# Patient Record
Sex: Male | Born: 1955
Health system: Southern US, Community
[De-identification: ages and names within clinical notes are randomized; demographics above are authoritative.]

## PROBLEM LIST (undated history)

## (undated) DIAGNOSIS — F419 Anxiety disorder, unspecified: Secondary | ICD-10-CM

## (undated) DIAGNOSIS — Z8489 Family history of other specified conditions: Secondary | ICD-10-CM

## (undated) DIAGNOSIS — L309 Dermatitis, unspecified: Secondary | ICD-10-CM

## (undated) DIAGNOSIS — E7800 Pure hypercholesterolemia, unspecified: Secondary | ICD-10-CM

## (undated) DIAGNOSIS — R519 Headache, unspecified: Secondary | ICD-10-CM

## (undated) DIAGNOSIS — E0590 Thyrotoxicosis, unspecified without thyrotoxic crisis or storm: Secondary | ICD-10-CM

## (undated) DIAGNOSIS — M75101 Unspecified rotator cuff tear or rupture of right shoulder, not specified as traumatic: Secondary | ICD-10-CM

## (undated) DIAGNOSIS — Z87442 Personal history of urinary calculi: Secondary | ICD-10-CM

## (undated) DIAGNOSIS — R51 Headache: Secondary | ICD-10-CM

## (undated) DIAGNOSIS — G4730 Sleep apnea, unspecified: Secondary | ICD-10-CM

## (undated) DIAGNOSIS — E119 Type 2 diabetes mellitus without complications: Secondary | ICD-10-CM

## (undated) DIAGNOSIS — F988 Other specified behavioral and emotional disorders with onset usually occurring in childhood and adolescence: Secondary | ICD-10-CM

## (undated) DIAGNOSIS — M1990 Unspecified osteoarthritis, unspecified site: Secondary | ICD-10-CM

## (undated) DIAGNOSIS — I10 Essential (primary) hypertension: Secondary | ICD-10-CM

## (undated) HISTORY — PX: SHOULDER SURGERY: SHX246

## (undated) HISTORY — DX: Type 2 diabetes mellitus without complications: E11.9

## (undated) HISTORY — DX: Dermatitis, unspecified: L30.9

## (undated) HISTORY — PX: APPENDECTOMY: SHX54

## (undated) HISTORY — DX: Pure hypercholesterolemia, unspecified: E78.00

---

## 2012-05-03 HISTORY — PX: OTHER SURGICAL HISTORY: SHX169

## 2014-05-03 HISTORY — PX: SHOULDER SURGERY: SHX246

## 2014-09-27 NOTE — H&P (Signed)
TOTAL HIP ADMISSION H&P  Patient is admitted for right total hip arthroplasty, anterior approach.  Subjective:  Chief Complaint:  Right hip primary OA / pain  HPI: Steven Contreras, 59 y.o. male, has a history of pain and functional disability in the right hip(s) due to arthritis and patient has failed non-surgical conservative treatments for greater than 12 weeks to include NSAID's and/or analgesics, corticosteriod injections and activity modification.  Onset of symptoms was abrupt starting a couple months ago  with rapidlly worsening course since that time.The patient noted no past surgery on the right hip(s).  Patient currently rates pain in the right hip at 10 out of 10 with activity. Patient has night pain, worsening of pain with activity and weight bearing, trendelenberg gait, pain that interfers with activities of daily living and pain with passive range of motion. Patient has evidence of periarticular osteophytes and joint space narrowing by imaging studies. This condition presents safety issues increasing the risk of falls.  There is no current active infection.  Risks, benefits and expectations were discussed with the patient.  Risks including but not limited to the risk of anesthesia, blood clots, nerve damage, blood vessel damage, failure of the prosthesis, infection and up to and including death.  Patient understand the risks, benefits and expectations and wishes to proceed with surgery.   PCP: Emeterio ReeveWOLTERS,SHARON A, MD  D/C Plans:      Home with HHPT  (same day if possible)  Post-op Meds:       No Rx given  Tranexamic Acid:      To be given - IV   Decadron:      Is to be given  FYI:     ASA post-op  Norco post-op     Past Medical History  Diagnosis Date  . Hypertension   . ADD (attention deficit disorder)   . Anxiety     hx of  . Arthritis   . Headache   . Right rotator cuff tear   . Family history of adverse reaction to anesthesia     mother slow to awaken    Past  Surgical History  Procedure Laterality Date  . Shoulder surgery Left years ago    rotator, bicep and clavicle repair.  . Colonscopy  2014    No prescriptions prior to admission   Allergies  Allergen Reactions  . Ampicillin     NOT sure if its ampicillin or amoxicillin---stomach cramps     History  Substance Use Topics  . Smoking status: Former Smoker -- 0.50 packs/day for 15 years    Types: Cigarettes    Quit date: 05/03/2004  . Smokeless tobacco: Never Used  . Alcohol Use: Yes     Comment: 2 shots burbon per day or beer       Review of Systems  Constitutional: Negative.   Eyes: Negative.   Respiratory: Negative.   Cardiovascular: Negative.   Gastrointestinal: Negative.   Genitourinary: Negative.   Musculoskeletal: Positive for joint pain.  Skin: Negative.   Neurological: Positive for headaches.  Endo/Heme/Allergies: Negative.   Psychiatric/Behavioral: The patient is nervous/anxious.     Objective:  Physical Exam  Constitutional: He is oriented to person, place, and time. He appears well-developed and well-nourished.  HENT:  Head: Normocephalic and atraumatic.  Eyes: Pupils are equal, round, and reactive to light.  Neck: Neck supple. No JVD present. No tracheal deviation present. No thyromegaly present.  Cardiovascular: Normal rate, regular rhythm, normal heart sounds and intact distal pulses.  Respiratory: Effort normal and breath sounds normal. No stridor. No respiratory distress. He has no wheezes.  GI: Soft. There is no tenderness. There is no guarding.  Musculoskeletal:       Right hip: He exhibits decreased range of motion, decreased strength, tenderness and bony tenderness. He exhibits no swelling, no deformity and no laceration.  Lymphadenopathy:    He has no cervical adenopathy.  Neurological: He is alert and oriented to person, place, and time.  Skin: Skin is warm and dry.  Psychiatric: He has a normal mood and affect.     Imaging Review Plain  radiographs demonstrate moderate degenerative joint disease of the right hip(s). The bone quality appears to be good for age and reported activity level.  Assessment/Plan:  End stage arthritis, left hip(s)  The patient history, physical examination, clinical judgement of the provider and imaging studies are consistent with end stage degenerative joint disease of the right hip(s) and total hip arthroplasty is deemed medically necessary. The treatment options including medical management, injection therapy, arthroscopy and arthroplasty were discussed at length. The risks and benefits of total hip arthroplasty were presented and reviewed. The risks due to aseptic loosening, infection, stiffness, dislocation/subluxation,  thromboembolic complications and other imponderables were discussed.  The patient acknowledged the explanation, agreed to proceed with the plan and consent was signed. Patient is being admitted for inpatient treatment for surgery, pain control, PT, OT, prophylactic antibiotics, VTE prophylaxis, progressive ambulation and ADL's and discharge planning.The patient is planning to be discharged home with home health services.     Anastasio Auerbach Shykeem Resurreccion   PA-C  10/06/2014, 2:37 PM

## 2014-10-01 ENCOUNTER — Other Ambulatory Visit (HOSPITAL_COMMUNITY): Payer: Self-pay | Admitting: *Deleted

## 2014-10-01 NOTE — Patient Instructions (Addendum)
Steven SalmonRobert K Contreras  10/01/2014   Your procedure is scheduled on: Tuesday 10-08-14  [facility] and follow signs to               Short Stay Center at 515 AM.  Call this number if you have problems the morning of surgery [phone]   Remember: ONLY 1 PERSON MAY GO WITH YOU TO SHORT STAY TO GET  READY MORNING OF YOUR SURGERY.  Do not eat food or drink liquids :After Midnight.     Take these medicines the morning of surgery with A SIP OF WATER: flonase nasal spray if needed, eye drop if needed                               You may not have any metal on your body including hair pins and              piercings  Do not wear jewelry, make-up, lotions, powders or perfumes, deodorant             Do not wear nail polish.  Do not shave  48 hours prior to surgery.              Men may shave face and neck.   Do not bring valuables to the hospital. [place] IS NOT             RESPONSIBLE   FOR VALUABLES.  Contacts, dentures or bridgework may not be worn into surgery.  Leave suitcase in the car. After surgery it may be brought to your room.     Patients discharged the day of surgery will not be allowed to drive home.  Name and phone number of your driver:  Special Instructions: N/A              Please read over the following fact sheets you were given: _____________________________________________________________________             [facility] Pre[facility] Before surgery, you can play an important role.  Because skin is not sterile, your skin needs to be as free of germs as possible.  You can reduce the number of germs on your skin by washing with CHG (chlorahexidine gluconate) soap before surgery.  CHG is an antiseptic cleaner which kills germs and bonds with the skin to continue killing germs even after washing. Please DO NOT use if you have an allergy to CHG or antibacterial soaps.  If your skin becomes reddened/irritated stop using  the CHG and inform your nurse when you arrive at Short Stay. Do not shave (including legs and underarms) for at least 48 hours prior to the first CHG shower.  You may shave your face/neck. Please follow these instructions carefully:  1.  Shower with CHG Soap the night before surgery and the  morning of Surgery.  2.  If you choose to wash your hair, wash your hair first as usual with your  normal  shampoo.  3.  After you shampoo, rinse your hair and body thoroughly to remove the  shampoo.                           4.  Use CHG as you would any other liquid soap.  You can apply chg directly  to  the skin and wash                       Gently with a scrungie or clean washcloth.  5.  Apply the CHG Soap to your body ONLY FROM THE NECK DOWN.   Do not use on face/ open                           Wound or open sores. Avoid contact with eyes, ears mouth and genitals (private parts).                       Wash face,  Genitals (private parts) with your normal soap.             6.  Wash thoroughly, paying special attention to the area where your surgery  will be performed.  7.  Thoroughly rinse your body with warm water from the neck down.  8.  DO NOT shower/wash with your normal soap after using and rinsing off  the CHG Soap.                9.  Pat yourself dry with a clean towel.            10.  Wear clean pajamas.            11.  Place clean sheets on your bed the night of your first shower and do not  sleep with pets. Day of Surgery : Do not apply any lotions/deodorants the morning of surgery.  Please wear clean clothes to the hospital/surgery center.  FAILURE TO FOLLOW THESE INSTRUCTIONS MAY RESULT IN THE CANCELLATION OF YOUR SURGERY PATIENT SIGNATURE_________________________________  NURSE SIGNATURE__________________________________  ________________________________________________________________________   Steven Contreras  An incentive spirometer is a tool that can help keep your lungs  clear and active. This tool measures how well you are filling your lungs with each breath. Taking long deep breaths may help reverse or decrease the chance of developing breathing (pulmonary) problems (especially infection) following:  A long period of time when you are unable to move or be active. BEFORE THE PROCEDURE   If the spirometer includes an indicator to show your best effort, your nurse or respiratory therapist will set it to a desired goal.  If possible, sit up straight or lean slightly forward. Try not to slouch.  Hold the incentive spirometer in an upright position. INSTRUCTIONS FOR USE   Sit on the edge of your bed if possible, or sit up as far as you can in bed or on a chair.  Hold the incentive spirometer in an upright position.  Breathe out normally.  Place the mouthpiece in your mouth and seal your lips tightly around it.  Breathe in slowly and as deeply as possible, raising the piston or the ball toward the top of the column.  Hold your breath for 3-5 seconds or for as long as possible. Allow the piston or ball to fall to the bottom of the column.  Remove the mouthpiece from your mouth and breathe out normally.  Rest for a few seconds and repeat Steps 1 through 7 at least 10 times every 1-2 hours when you are awake. Take your time and take a few normal breaths between deep breaths.  The spirometer may include an indicator to show your best effort. Use the indicator as a goal to work toward during each repetition.  After each set  of 10 deep breaths, practice coughing to be sure your lungs are clear. If you have an incision (the cut made at the time of surgery), support your incision when coughing by placing a pillow or rolled up towels firmly against it. Once you are able to get out of bed, walk around indoors and cough well. You may stop using the incentive spirometer when instructed by your caregiver.  RISKS AND COMPLICATIONS  Take your time so you do not get  dizzy or light-headed.  If you are in pain, you may need to take or ask for pain medication before doing incentive spirometry. It is harder to take a deep breath if you are having pain. AFTER USE  Rest and breathe slowly and easily.  It can be helpful to keep track of a log of your progress. Your caregiver can provide you with a simple table to help with this. If you are using the spirometer at home, follow these instructions: [place] IF:   You are having difficultly using the spirometer.  You have trouble using the spirometer as often as instructed.  Your pain medication is not giving enough relief while using the spirometer.  You develop fever of 100.5 F (38.1 C) or higher. SEEK IMMEDIATE MEDICAL CARE IF:   You cough up bloody sputum that had not been present before.  You develop fever of 102 F (38.9 C) or greater.  You develop worsening pain at or near the incision site. MAKE SURE YOU:   Understand these instructions.  Will watch your condition.  Will get help right away if you are not doing well or get worse. Document Released: 08/30/2006 Document Revised: 07/12/2011 Document Reviewed: 10/31/2006 Exit[employer] Patient Information 2014 [employer], [place].   ________________________________________________________________________  WHAT IS A BLOOD TRANSFUSION? Blood Transfusion Information  A transfusion is the replacement of blood or some of its parts. Blood is made up of multiple cells which provide different functions.  Red blood cells carry oxygen and are used for blood loss replacement.  White blood cells fight against infection.  Platelets control bleeding.  Plasma helps clot blood.  Other blood products are available for specialized needs, such as hemophilia or other clotting disorders. BEFORE THE TRANSFUSION  Who gives blood for transfusions?   Healthy volunteers who are fully evaluated to make sure their blood is safe. This is blood bank  blood. Transfusion therapy is the safest it has ever been in the practice of medicine. Before blood is taken from a donor, a complete history is taken to make sure that person has no history of diseases nor engages in risky social behavior (examples are intravenous drug use or sexual activity with multiple partners). The donor's travel history is screened to minimize risk of transmitting infections, such as malaria. The donated blood is tested for signs of infectious diseases, such as HIV and hepatitis. The blood is then tested to be sure it is compatible with you in order to minimize the chance of a transfusion reaction. If you or a relative donates blood, this is often done in anticipation of surgery and is not appropriate for emergency situations. It takes many days to process the donated blood. RISKS AND COMPLICATIONS Although transfusion therapy is very safe and saves many lives, the main dangers of transfusion include:   Getting an infectious disease.  Developing a transfusion reaction. This is an allergic reaction to something in the blood you were given. Every precaution is taken to prevent this. The decision to have a  blood transfusion has been considered carefully by your caregiver before blood is given. Blood is not given unless the benefits outweigh the risks. AFTER THE TRANSFUSION  Right after receiving a blood transfusion, you will usually feel much better and more energetic. This is especially true if your red blood cells have gotten low (anemic). The transfusion raises the level of the red blood cells which carry oxygen, and this usually causes an energy increase.  The nurse administering the transfusion will monitor you carefully for complications. HOME CARE INSTRUCTIONS  No special instructions are needed after a transfusion. You may find your energy is better. Speak with your caregiver about any limitations on activity for underlying diseases you may have. SEEK MEDICAL CARE IF:    Your condition is not improving after your transfusion.  You develop redness or irritation at the intravenous (IV) site. SEEK IMMEDIATE MEDICAL CARE IF:  Any of the following symptoms occur over the next 12 hours:  Shaking chills.  You have a temperature by mouth above 102 F (38.9 C), not controlled by medicine.  Chest, back, or muscle pain.  People around you feel you are not acting correctly or are confused.  Shortness of breath or difficulty breathing.  Dizziness and fainting.  You get a rash or develop hives.  You have a decrease in urine output.  Your urine turns a dark color or changes to pink, red, or brown. Any of the following symptoms occur over the next 10 days:  You have a temperature by mouth above 102 F (38.9 C), not controlled by medicine.  Shortness of breath.  Weakness after normal activity.  The white part of the eye turns yellow (jaundice).  You have a decrease in the amount of urine or are urinating less often.  Your urine turns a dark color or changes to pink, red, or brown. Document Released: 04/16/2000 Document Revised: 07/12/2011 Document Reviewed: 12/04/2007 [facility] Patient Information 2014 [place], [place].  _______________________________________________________________________

## 2014-10-03 ENCOUNTER — Encounter (HOSPITAL_COMMUNITY): Payer: Self-pay

## 2014-10-03 ENCOUNTER — Encounter (HOSPITAL_COMMUNITY)
Admission: RE | Admit: 2014-10-03 | Discharge: 2014-10-03 | Disposition: A | Discharge status: Home / Self Care | Payer: Commercial Managed Care - PPO | Source: Ambulatory Visit | Attending: Orthopedic Surgery | Admitting: Orthopedic Surgery

## 2014-10-03 DIAGNOSIS — Z01810 Encounter for preprocedural cardiovascular examination: Secondary | ICD-10-CM | POA: Insufficient documentation

## 2014-10-03 DIAGNOSIS — Z01812 Encounter for preprocedural laboratory examination: Secondary | ICD-10-CM | POA: Insufficient documentation

## 2014-10-03 DIAGNOSIS — M1611 Unilateral primary osteoarthritis, right hip: Secondary | ICD-10-CM | POA: Insufficient documentation

## 2014-10-03 HISTORY — DX: Headache, unspecified: R51.9

## 2014-10-03 HISTORY — DX: Anxiety disorder, unspecified: F41.9

## 2014-10-03 HISTORY — DX: Family history of other specified conditions: Z84.89

## 2014-10-03 HISTORY — DX: Unspecified rotator cuff tear or rupture of right shoulder, not specified as traumatic: M75.101

## 2014-10-03 HISTORY — DX: Other specified behavioral and emotional disorders with onset usually occurring in childhood and adolescence: F98.8

## 2014-10-03 HISTORY — DX: Essential (primary) hypertension: I10

## 2014-10-03 HISTORY — DX: Headache: R51

## 2014-10-03 HISTORY — DX: Unspecified osteoarthritis, unspecified site: M19.90

## 2014-10-03 LAB — URINALYSIS, ROUTINE W REFLEX MICROSCOPIC
BILIRUBIN URINE: NEGATIVE
Glucose, UA: NEGATIVE mg/dL
KETONES UR: NEGATIVE mg/dL
Leukocytes, UA: NEGATIVE
Nitrite: NEGATIVE
PH: 5 (ref 5.0–8.0)
Protein, ur: NEGATIVE mg/dL
Specific Gravity, Urine: 1.008 (ref 1.005–1.030)
UROBILINOGEN UA: 0.2 mg/dL (ref 0.0–1.0)

## 2014-10-03 LAB — BASIC METABOLIC PANEL
ANION GAP: 10 (ref 5–15)
BUN: 12 mg/dL (ref 6–20)
CHLORIDE: 102 mmol/L (ref 101–111)
CO2: 25 mmol/L (ref 22–32)
CREATININE: 0.59 mg/dL — AB (ref 0.61–1.24)
Calcium: 10.4 mg/dL — ABNORMAL HIGH (ref 8.9–10.3)
GFR calc Af Amer: >60 mL/min (ref >60)
GFR calc non Af Amer: >60 mL/min (ref >60)
Glucose, Bld: 118 mg/dL — ABNORMAL HIGH (ref 65–99)
Potassium: 4 mmol/L (ref 3.5–5.1)
SODIUM: 137 mmol/L (ref 135–145)

## 2014-10-03 LAB — CBC
HCT: 46.3 % (ref 39.0–52.0)
Hemoglobin: 15.7 g/dL (ref 13.0–17.0)
MCH: 28.7 pg (ref 26.0–34.0)
MCHC: 33.9 g/dL (ref 30.0–36.0)
MCV: 84.6 fL (ref 78.0–100.0)
Platelets: 255 10*3/uL (ref 150–400)
RBC: 5.47 MIL/uL (ref 4.22–5.81)
RDW: 12.6 % (ref 11.5–15.5)
WBC: 6.8 10*3/uL (ref 4.0–10.5)

## 2014-10-03 LAB — PROTIME-INR
INR: 0.96 (ref 0.00–1.49)
Prothrombin Time: 13 seconds (ref 11.6–15.2)

## 2014-10-03 LAB — URINE MICROSCOPIC-ADD ON

## 2014-10-03 LAB — SURGICAL PCR SCREEN
MRSA, PCR: NEGATIVE
STAPHYLOCOCCUS AUREUS: NEGATIVE

## 2014-10-03 LAB — APTT: aPTT: 26 seconds (ref 24–37)

## 2014-10-03 LAB — ABO/RH: ABO/RH(D): O POS

## 2014-10-03 NOTE — Progress Notes (Signed)
   10/03/14 0855  OBSTRUCTIVE SLEEP APNEA  Have you ever been diagnosed with sleep apnea through a sleep study? No  Do you snore loudly (loud enough to be heard through closed doors)?  0  Do you often feel tired, fatigued, or sleepy during the daytime? 0  Has anyone observed you stop breathing during your sleep? 1  Do you have, or are you being treated for high blood pressure? 1  BMI more than 35 kg/m2? 1  Age over 60 years old? 1  Neck circumference greater than 40 cm/16 inches? 1 ( 18 inches)  Gender: 1  Obstructive Sleep Apnea Score 6

## 2014-10-08 ENCOUNTER — Inpatient Hospital Stay (HOSPITAL_COMMUNITY): Payer: Commercial Managed Care - PPO | Admitting: Certified Registered Nurse Anesthetist

## 2014-10-08 ENCOUNTER — Ambulatory Visit (HOSPITAL_COMMUNITY)
Admission: RE | Admit: 2014-10-08 | Discharge: 2014-10-08 | Disposition: A | Discharge status: Home / Self Care | Payer: Commercial Managed Care - PPO | Source: Ambulatory Visit | Attending: Orthopedic Surgery | Admitting: Orthopedic Surgery

## 2014-10-08 ENCOUNTER — Ambulatory Visit (HOSPITAL_COMMUNITY): Payer: Commercial Managed Care - PPO

## 2014-10-08 ENCOUNTER — Inpatient Hospital Stay (HOSPITAL_COMMUNITY): Payer: Commercial Managed Care - PPO

## 2014-10-08 ENCOUNTER — Encounter (HOSPITAL_COMMUNITY)
Admission: RE | Disposition: A | Discharge status: Home / Self Care | Payer: Self-pay | Source: Ambulatory Visit | Attending: Orthopedic Surgery

## 2014-10-08 ENCOUNTER — Encounter (HOSPITAL_COMMUNITY): Payer: Self-pay

## 2014-10-08 DIAGNOSIS — F988 Other specified behavioral and emotional disorders with onset usually occurring in childhood and adolescence: Secondary | ICD-10-CM | POA: Insufficient documentation

## 2014-10-08 DIAGNOSIS — I10 Essential (primary) hypertension: Secondary | ICD-10-CM | POA: Insufficient documentation

## 2014-10-08 DIAGNOSIS — Z79899 Other long term (current) drug therapy: Secondary | ICD-10-CM | POA: Diagnosis not present

## 2014-10-08 DIAGNOSIS — Z87891 Personal history of nicotine dependence: Secondary | ICD-10-CM | POA: Diagnosis not present

## 2014-10-08 DIAGNOSIS — M1611 Unilateral primary osteoarthritis, right hip: Secondary | ICD-10-CM | POA: Insufficient documentation

## 2014-10-08 DIAGNOSIS — Z96649 Presence of unspecified artificial hip joint: Secondary | ICD-10-CM

## 2014-10-08 HISTORY — PX: TOTAL HIP ARTHROPLASTY: SHX124

## 2014-10-08 LAB — TYPE AND SCREEN
ABO/RH(D): O POS
ANTIBODY SCREEN: NEGATIVE

## 2014-10-08 SURGERY — ARTHROPLASTY, HIP, TOTAL, ANTERIOR APPROACH
Anesthesia: Spinal | Site: Hip | Laterality: Right

## 2014-10-08 MED ORDER — SODIUM CHLORIDE 0.9 % IR SOLN
Status: DC | PRN
  Administered 2014-10-08: 1000 mL

## 2014-10-08 MED ORDER — CEPHALEXIN 500 MG PO CAPS
500.0000 mg | ORAL_CAPSULE | Freq: Three times a day (TID) | ORAL | Status: DC
Start: 2014-10-08 — End: 2017-12-20

## 2014-10-08 MED ORDER — FENTANYL CITRATE (PF) 100 MCG/2ML IJ SOLN
INTRAMUSCULAR | Status: AC
  Filled 2014-10-08: qty 2

## 2014-10-08 MED ORDER — HYDROMORPHONE HCL 1 MG/ML IJ SOLN: 0.2500 mg | INTRAMUSCULAR | Status: DC | PRN

## 2014-10-08 MED ORDER — PROPOFOL 10 MG/ML IV BOLUS
INTRAVENOUS | Status: AC
  Filled 2014-10-08: qty 20

## 2014-10-08 MED ORDER — CELECOXIB 200 MG PO CAPS: 200.0000 mg | ORAL_CAPSULE | Freq: Two times a day (BID) | ORAL | Status: DC

## 2014-10-08 MED ORDER — OXYCODONE HCL 5 MG PO TABS: 5.0000 mg | ORAL_TABLET | ORAL | Status: DC | PRN

## 2014-10-08 MED ORDER — METHOCARBAMOL 500 MG PO TABS
500.0000 mg | ORAL_TABLET | Freq: Four times a day (QID) | ORAL | Status: DC | PRN
  Administered 2014-10-08: 500 mg via ORAL
  Filled 2014-10-08: qty 1

## 2014-10-08 MED ORDER — PHENYLEPHRINE HCL 10 MG/ML IJ SOLN
INTRAMUSCULAR | Status: DC | PRN
  Administered 2014-10-08: 40 ug via INTRAVENOUS

## 2014-10-08 MED ORDER — DEXAMETHASONE SODIUM PHOSPHATE 10 MG/ML IJ SOLN
INTRAMUSCULAR | Status: AC
  Filled 2014-10-08: qty 1

## 2014-10-08 MED ORDER — MIDAZOLAM HCL 2 MG/2ML IJ SOLN
INTRAMUSCULAR | Status: AC
  Filled 2014-10-08: qty 2

## 2014-10-08 MED ORDER — LACTATED RINGERS IV SOLN
INTRAVENOUS | Status: DC
  Administered 2014-10-08: 10:00:00 via INTRAVENOUS

## 2014-10-08 MED ORDER — METHOCARBAMOL 500 MG PO TABS: 500.0000 mg | ORAL_TABLET | Freq: Four times a day (QID) | ORAL | Status: DC | PRN

## 2014-10-08 MED ORDER — PHENYLEPHRINE 40 MCG/ML (10ML) SYRINGE FOR IV PUSH (FOR BLOOD PRESSURE SUPPORT)
PREFILLED_SYRINGE | INTRAVENOUS | Status: AC
  Filled 2014-10-08: qty 10

## 2014-10-08 MED ORDER — CEFAZOLIN SODIUM-DEXTROSE 2-3 GM-% IV SOLR
2.0000 g | INTRAVENOUS | Status: AC
  Administered 2014-10-08: 2 g via INTRAVENOUS

## 2014-10-08 MED ORDER — OXYCODONE HCL 5 MG PO TABS
5.0000 mg | ORAL_TABLET | ORAL | Status: DC | PRN
  Administered 2014-10-08 (×2): 5 mg via ORAL
  Filled 2014-10-08 (×2): qty 1

## 2014-10-08 MED ORDER — LACTATED RINGERS IV SOLN
INTRAVENOUS | Status: DC | PRN
  Administered 2014-10-08: 07:00:00 via INTRAVENOUS

## 2014-10-08 MED ORDER — DEXAMETHASONE SODIUM PHOSPHATE 10 MG/ML IJ SOLN
10.0000 mg | Freq: Once | INTRAMUSCULAR | Status: AC
  Administered 2014-10-08: 10 mg via INTRAVENOUS

## 2014-10-08 MED ORDER — ASPIRIN EC 325 MG PO TBEC: 325.0000 mg | DELAYED_RELEASE_TABLET | Freq: Two times a day (BID) | ORAL | Status: AC

## 2014-10-08 MED ORDER — EPHEDRINE SULFATE 50 MG/ML IJ SOLN
INTRAMUSCULAR | Status: DC | PRN
  Administered 2014-10-08: 5 mg via INTRAVENOUS

## 2014-10-08 MED ORDER — ONDANSETRON HCL 4 MG/2ML IJ SOLN
INTRAMUSCULAR | Status: AC
  Filled 2014-10-08: qty 2

## 2014-10-08 MED ORDER — MIDAZOLAM HCL 5 MG/5ML IJ SOLN
INTRAMUSCULAR | Status: DC | PRN
  Administered 2014-10-08: 2 mg via INTRAVENOUS

## 2014-10-08 MED ORDER — DEXAMETHASONE SODIUM PHOSPHATE 4 MG/ML IJ SOLN
INTRAMUSCULAR | Status: DC | PRN
  Administered 2014-10-08: 10 mg via INTRAVENOUS

## 2014-10-08 MED ORDER — MEPERIDINE HCL 50 MG/ML IJ SOLN: 6.2500 mg | INTRAMUSCULAR | Status: DC | PRN

## 2014-10-08 MED ORDER — BUPIVACAINE IN DEXTROSE 0.75-8.25 % IT SOLN
INTRATHECAL | Status: DC | PRN
  Administered 2014-10-08: 15 mg via INTRATHECAL

## 2014-10-08 MED ORDER — PROMETHAZINE HCL 25 MG/ML IJ SOLN: 6.2500 mg | INTRAMUSCULAR | Status: DC | PRN

## 2014-10-08 MED ORDER — FENTANYL CITRATE (PF) 250 MCG/5ML IJ SOLN
INTRAMUSCULAR | Status: DC | PRN
  Administered 2014-10-08 (×2): 50 ug via INTRAVENOUS

## 2014-10-08 MED ORDER — LIDOCAINE HCL (CARDIAC) 20 MG/ML IV SOLN
INTRAVENOUS | Status: DC | PRN
  Administered 2014-10-08: 30 mg via INTRAVENOUS

## 2014-10-08 MED ORDER — ONDANSETRON HCL 4 MG/2ML IJ SOLN
INTRAMUSCULAR | Status: DC | PRN
  Administered 2014-10-08: 4 mg via INTRAVENOUS

## 2014-10-08 MED ORDER — PROPOFOL INFUSION 10 MG/ML OPTIME
INTRAVENOUS | Status: DC | PRN
Start: 2014-10-08 — End: 2014-10-08
  Administered 2014-10-08: 50 ug/kg/min via INTRAVENOUS

## 2014-10-08 MED ORDER — TRANEXAMIC ACID 1000 MG/10ML IV SOLN
1000.0000 mg | Freq: Once | INTRAVENOUS | Status: AC
  Administered 2014-10-08: 1000 mg via INTRAVENOUS
  Filled 2014-10-08: qty 10

## 2014-10-08 MED ORDER — CEFAZOLIN SODIUM-DEXTROSE 2-3 GM-% IV SOLR
INTRAVENOUS | Status: AC
  Filled 2014-10-08: qty 50

## 2014-10-08 SURGICAL SUPPLY — 43 items
BAG DECANTER FOR FLEXI CONT (MISCELLANEOUS) IMPLANT
BAG SPEC THK2 15X12 ZIP CLS (MISCELLANEOUS)
BAG ZIPLOCK 12X15 (MISCELLANEOUS) IMPLANT
CAPT HIP TOTAL 2 ×2 IMPLANT
COVER PERINEAL POST (MISCELLANEOUS) ×2 IMPLANT
DRAPE C-ARM 42X120 X-RAY (DRAPES) ×2 IMPLANT
DRAPE STERI IOBAN 125X83 (DRAPES) ×2 IMPLANT
DRAPE U-SHAPE 47X51 STRL (DRAPES) ×6 IMPLANT
DRSG AQUACEL AG ADV 3.5X10 (GAUZE/BANDAGES/DRESSINGS) ×2 IMPLANT
DURAPREP 26ML APPLICATOR (WOUND CARE) ×2 IMPLANT
ELECT BLADE TIP CTD 4 INCH (ELECTRODE) ×2 IMPLANT
ELECT PENCIL ROCKER SW 15FT (MISCELLANEOUS) IMPLANT
ELECT REM PT RETURN 15FT ADLT (MISCELLANEOUS) IMPLANT
ELECT REM PT RETURN 9FT ADLT (ELECTROSURGICAL) ×2
ELECTRODE REM PT RTRN 9FT ADLT (ELECTROSURGICAL) ×1 IMPLANT
FACESHIELD WRAPAROUND (MASK) ×8 IMPLANT
GLOVE BIOGEL PI IND STRL 7.5 (GLOVE) ×1 IMPLANT
GLOVE BIOGEL PI IND STRL 8.5 (GLOVE) ×1 IMPLANT
GLOVE BIOGEL PI INDICATOR 7.5 (GLOVE) ×1
GLOVE BIOGEL PI INDICATOR 8.5 (GLOVE) ×1
GLOVE ECLIPSE 8.0 STRL XLNG CF (GLOVE) ×4 IMPLANT
GLOVE ORTHO TXT STRL SZ7.5 (GLOVE) ×2 IMPLANT
GOWN SPEC L3 XXLG W/TWL (GOWN DISPOSABLE) ×2 IMPLANT
GOWN STRL REUS W/TWL LRG LVL3 (GOWN DISPOSABLE) ×2 IMPLANT
HOLDER FOLEY CATH W/STRAP (MISCELLANEOUS) ×2 IMPLANT
KIT BASIN OR (CUSTOM PROCEDURE TRAY) ×2 IMPLANT
LIQUID BAND (GAUZE/BANDAGES/DRESSINGS) ×2 IMPLANT
NDL SAFETY ECLIPSE 18X1.5 (NEEDLE) IMPLANT
NEEDLE HYPO 18GX1.5 SHARP (NEEDLE)
PACK TOTAL JOINT (CUSTOM PROCEDURE TRAY) ×2 IMPLANT
PEN SKIN MARKING BROAD (MISCELLANEOUS) ×2 IMPLANT
SAW OSC TIP CART 19.5X105X1.3 (SAW) ×2 IMPLANT
SUT MNCRL AB 4-0 PS2 18 (SUTURE) ×2 IMPLANT
SUT VIC AB 1 CT1 36 (SUTURE) ×6 IMPLANT
SUT VIC AB 2-0 CT1 27 (SUTURE) ×4
SUT VIC AB 2-0 CT1 TAPERPNT 27 (SUTURE) ×2 IMPLANT
SUT VLOC 180 0 24IN GS25 (SUTURE) ×2 IMPLANT
SYR 50ML LL SCALE MARK (SYRINGE) IMPLANT
TOWEL OR 17X26 10 PK STRL BLUE (TOWEL DISPOSABLE) ×2 IMPLANT
TOWEL OR NON WOVEN STRL DISP B (DISPOSABLE) IMPLANT
TRAY FOLEY CATH 16FR SILVER (SET/KITS/TRAYS/PACK) ×2 IMPLANT
WATER STERILE IRR 1500ML POUR (IV SOLUTION) ×2 IMPLANT
YANKAUER SUCT BULB TIP 10FT TU (MISCELLANEOUS) ×2 IMPLANT

## 2014-10-08 NOTE — Progress Notes (Signed)
Patient fidgety. Can not stay in bed. Up in room w assistance. Gait stable

## 2014-10-08 NOTE — Evaluation (Signed)
Physical Therapy Evaluation Patient Details Name: RASHAN ROUNSAVILLE MRN: [id number] DOB: Dec 09, 1955 Today's Date: 10/08/2014   History of Present Illness  59 yo male s/p R THA-direct anterior 10/08/14. Hx of HTN, ADD, anxiety  Clinical Impression  On eval POD 0, pt required Min assist for bed mobility and Min guard assist for standing, ambulation-walked ~125 feet with RW. Pt rated pain ~8/10 with activity. Pt is very anxious to d/c from hospital. Cues for safety, pacing throughout session. Instructed pt to use RW and not cane. Discussed step negotiation-educated on "up with good, down with bad". Reviewed car transfer. All education completed.     Follow Up Recommendations Home health PT    Equipment Recommendations  None recommended by PT    Recommendations for Other Services       Precautions / Restrictions Precautions Precautions: Fall Restrictions Weight Bearing Restrictions: No RLE Weight Bearing: Weight bearing as tolerated      Mobility  Bed Mobility Overal bed mobility: Needs Assistance Bed Mobility: Sit to Supine       Sit to supine: Min assist   General bed mobility comments: Assist for R LE onto bed.   Transfers Overall transfer level: Needs assistance Equipment used: Rolling walker (2 wheeled) Transfers: Sit to/from Stand Sit to Stand: Min guard         General transfer comment: close guard for safety. VCs safety, hand placement  Ambulation/Gait Ambulation/Gait assistance: Min guard Ambulation Distance (Feet): 125 Feet Assistive device: Rolling walker (2 wheeled) Gait Pattern/deviations: Step-through pattern;Decreased stride length;Antalgic     General Gait Details: close guard for safety. VC safe RW use, pacing.   Stairs            Wheelchair Mobility    Modified Rankin (Stroke Patients Only)       Balance                                             Pertinent Vitals/Pain Pain Assessment: 0-10 Pain Score: 8  Pain  Location: R hip, thigh Pain Descriptors / Indicators: Aching;Sore Pain Intervention(s): Monitored during session;Premedicated before session    [place] expects to be discharged to:: Private residence Living Arrangements: Spouse/significant other Available Help at Discharge: Family Type of Home: House Home Access: Stairs to enter   Technical brewer of Steps: 1 Home Layout: Able to live on main level with bedroom/bathroom;Two level Home Equipment: Walker - 2 wheels;Cane - single point      Prior Function Level of Independence: Independent with assistive device(s)         Comments: using cane PTA     Hand Dominance        Extremity/Trunk Assessment   Upper Extremity Assessment: Overall WFL for tasks assessed           Lower Extremity Assessment: RLE deficits/detail RLE Deficits / Details: at least: hip flex 2/5, hip abd 2/5. moves ankle well    Cervical / Trunk Assessment: Normal  Communication   Communication: No difficulties  Cognition Arousal/Alertness: Awake/alert Behavior During Therapy: WFL for tasks assessed/performed Overall Cognitive Status: Within Functional Limits for tasks assessed                      General Comments      Exercises Total Joint Exercises Ankle Circles/Pumps: AROM;Right;10 reps;Supine Quad Sets: AROM;Right;10 reps;Supine Heel Slides: AAROM;Right;10 reps;Supine  Hip ABduction/ADduction: AAROM;Right;10 reps;Supine      Assessment/Plan    PT Assessment All further PT needs can be met in the next venue of care (HHPT)  PT Diagnosis Difficulty walking;Acute pain   PT Problem List    PT Treatment Interventions     PT Goals (Current goals can be found in the Care Plan section) Acute Rehab PT Goals Patient Stated Goal: home  PT Goal Formulation: All assessment and education complete, DC therapy Potential to Achieve Goals: Good    Frequency     Barriers to discharge        Co-evaluation                End of Session Equipment Utilized During Treatment: Gait belt Activity Tolerance: Patient limited by pain Patient left: in bed;with call bell/phone within reach;with family/visitor present           Time: 4332-9518 PT Time Calculation (min) (ACUTE ONLY): 13 min   Charges:   PT Evaluation $Initial PT Evaluation Tier I: 1 Procedure     PT G Codes:        Weston Anna, MPT Pager: [phone]

## 2014-10-08 NOTE — Anesthesia Preprocedure Evaluation (Addendum)
Anesthesia Evaluation  Patient identified by MRN, date of birth, ID band Patient awake    Reviewed: Allergy & Precautions, NPO status , Patient's Chart, lab work & pertinent test results  Airway Mallampati: II  TM Distance: >3 FB Neck ROM: Full    Dental no notable dental hx.    Pulmonary neg pulmonary ROS, former smoker,  breath sounds clear to auscultation  Pulmonary exam normal       Cardiovascular hypertension, Pt. on medications Normal cardiovascular examRhythm:Regular Rate:Normal     Neuro/Psych negative neurological ROS  negative psych ROS   GI/Hepatic negative GI ROS, Neg liver ROS,   Endo/Other  negative endocrine ROS  Renal/GU negative Renal ROS  negative genitourinary   Musculoskeletal negative musculoskeletal ROS (+)   Abdominal   Peds negative pediatric ROS (+)  Hematology negative hematology ROS (+)   Anesthesia Other Findings   Reproductive/Obstetrics negative OB ROS                            Anesthesia Physical Anesthesia Plan  ASA: II  Anesthesia Plan: Spinal   Post-op Pain Management:    Induction:   Airway Management Planned: Simple Face Mask  Additional Equipment:   Intra-op Plan:   Post-operative Plan:   Informed Consent: I have reviewed the patients History and Physical, chart, labs and discussed the procedure including the risks, benefits and alternatives for the proposed anesthesia with the patient or authorized representative who has indicated his/her understanding and acceptance.   Dental advisory given  Plan Discussed with: CRNA  Anesthesia Plan Comments:         Anesthesia Quick Evaluation

## 2014-10-08 NOTE — Progress Notes (Signed)
[facility]

## 2014-10-08 NOTE — Transfer of Care (Signed)
Immediate Anesthesia Transfer of Care Note  Patient: Steven Contreras  Procedure(s) Performed: Procedure(s): RIGHT TOTAL HIP ARTHROPLASTY ANTERIOR APPROACH (Right)  Patient Location: PACU  Anesthesia Type:Spinal  Level of Consciousness: alert  and oriented  Airway & Oxygen Therapy: Patient Spontanous Breathing and Patient connected to nasal cannula oxygen  Post-op Assessment: Report given to RN and Post -op Vital signs reviewed and stable  Post vital signs: Reviewed and stable  Last Vitals:  Filed Vitals:   10/08/14 0538  BP: 152/99  Pulse: 76  Temp: 36.8 C  Resp: 18    Complications: No apparent anesthesia complications

## 2014-10-08 NOTE — Progress Notes (Signed)
   10/08/14 1436  PT Time Calculation  PT Start Time (ACUTE ONLY) 1407  PT Stop Time (ACUTE ONLY) 1420  PT Time Calculation (min) (ACUTE ONLY) 13 min  PT G-Codes **NOT FOR INPATIENT CLASS**  Functional Assessment Tool Used (clinical judgement)  Functional Limitation Mobility: Walking and moving around  Mobility: Walking and Moving Around Current Status (B2841(G8978) CI  Mobility: Walking and Moving Around Goal Status (L2440(G8979) CI  Mobility: Walking and Moving Around Discharge Status (N0272(G8980) CI  PT General Charges  $$ ACUTE PT VISIT 1 Procedure  PT Evaluation  $Initial PT Evaluation Tier I 1 Procedure   Steven AlertJannie Lorilee Contreras, MPT [phone]

## 2014-10-08 NOTE — Interval H&P Note (Signed)
History and Physical Interval Note:  10/08/2014 6:38 AM  Steven Contreras  has presented today for surgery, with the diagnosis of RIGHT HIP OA   The various methods of treatment have been discussed with the patient and family. After consideration of risks, benefits and other options for treatment, the patient has consented to  Procedure(s): RIGHT TOTAL HIP ARTHROPLASTY ANTERIOR APPROACH (Right) as a surgical intervention .  The patient's history has been reviewed, patient examined, no change in status, stable for surgery.  I have reviewed the patient's chart and labs.  Questions were answered to the patient's satisfaction.     Shelda PalLIN,Steven Contreras D

## 2014-10-08 NOTE — Progress Notes (Signed)
Patient w good strength of feet bilaterally when pushed against them Able to stand at bedside w assistance.

## 2014-10-08 NOTE — Progress Notes (Signed)
PT called again. Patient ambulated to BR w walker w asistance

## 2014-10-08 NOTE — Anesthesia Procedure Notes (Signed)
Spinal Patient location during procedure: OR Staffing Anesthesiologist: Montez Hageman Performed by: anesthesiologist  Preanesthetic Checklist Completed: patient identified, site marked, surgical consent, pre-op evaluation, timeout performed, IV checked, risks and benefits discussed and monitors and equipment checked Spinal Block Patient position: sitting Prep: Betadine Patient monitoring: heart rate, continuous pulse ox and blood pressure Approach: left paramedian Location: L4-5 Injection technique: single-shot Needle Needle type: Spinocan  Needle gauge: 22 G Needle length: 9 cm Additional Notes Expiration date of kit checked and confirmed. Patient tolerated procedure well, without complications.

## 2014-10-08 NOTE — Progress Notes (Signed)
PT is now in working w Patient

## 2014-10-08 NOTE — Discharge Instructions (Signed)

## 2014-10-08 NOTE — Progress Notes (Signed)
Patient's pain is better while up moving in room than lying. Holding was ready for patient as soon as his preop was done , so he went to holding and will help w pain relief there.

## 2014-10-08 NOTE — Op Note (Signed)
NAME:  Steven Contreras                ACCOUNT NO.: [id number]      MEDICAL RECORD NO.: [id number]      FACILITY:  [facility]      PHYSICIAN:  Durene Romans D  DATE OF BIRTH:  March 26, 1956     DATE OF PROCEDURE:  10/08/2014                                 OPERATIVE REPORT         PREOPERATIVE DIAGNOSIS: Right  hip osteoarthritis.      POSTOPERATIVE DIAGNOSIS:  Right hip osteoarthritis.      PROCEDURE:  Right total hip replacement through an anterior approach   utilizing DePuy THR system, component size 54mm pinnacle cup, a size 36+4 neutral   Altrex liner, a size 5 Hi Tri Lock stem with a 36+1.5 delta ceramic   ball.      SURGEON:  Madlyn Frankel. Charlann Boxer, M.D.      ASSISTANT:  Lanney Gins, PA-C     ANESTHESIA:  Spinal.      SPECIMENS:  None.      COMPLICATIONS:  None.      BLOOD LOSS:  300 cc     DRAINS:  None.      INDICATION OF THE PROCEDURE:  Steven Contreras is a 59 y.o. male who had   presented to office for evaluation of right hip pain.  Radiographs revealed   progressive degenerative changes with bone-on-bone   articulation to the  hip joint.  The patient had painful limited range of   motion significantly affecting their overall quality of life.  The patient was failing to    respond to conservative measures, and at this point was ready   to proceed with more definitive measures.  The patient has noted progressive   degenerative changes in his hip, progressive problems and dysfunction   with regarding the hip prior to surgery.  Consent was obtained for   benefit of pain relief.  Specific risk of infection, DVT, component   failure, dislocation, need for revision surgery, as well discussion of   the anterior versus posterior approach were reviewed.  Consent was   obtained for benefit of anterior pain relief through an anterior   approach.      PROCEDURE IN DETAIL:  The patient was brought to operative theater.   Once adequate anesthesia, preoperative  antibiotics, 2gm of Ancef, 1gm of Tranexamic Acid, and  of Decadron administered.   The patient was positioned supine on the OSI Hanna table.  Once adequate   padding of boney process was carried out, we had predraped out the hip, and  used fluoroscopy to confirm orientation of the pelvis and position.      The right hip was then prepped and draped from proximal iliac crest to   mid thigh with shower curtain technique.      Time-out was performed identifying the patient, planned procedure, and   extremity.     An incision was then made 2 cm distal and lateral to the   anterior superior iliac spine extending over the orientation of the   tensor fascia lata muscle and sharp dissection was carried down to the   fascia of the muscle and protractor placed in the soft tissues.      The fascia was then incised.  The muscle belly was identified and swept   laterally and retractor placed along the superior neck.  Following   cauterization of the circumflex vessels and removing some pericapsular   fat, a second cobra retractor was placed on the inferior neck.  A third   retractor was placed on the anterior acetabulum after elevating the   anterior rectus.  A L-capsulotomy was along the line of the   superior neck to the trochanteric fossa, then extended proximally and   distally.  Tag sutures were placed and the retractors were then placed   intracapsular.  We then identified the trochanteric fossa and   orientation of my neck cut, confirmed this radiographically   and then made a neck osteotomy with the femur on traction.  The femoral   head was removed without difficulty or complication.  Traction was let   off and retractors were placed posterior and anterior around the   acetabulum.      The labrum and foveal tissue were debrided.  I began reaming with a 47mm   reamer and reamed up to 53mm reamer with good bony bed preparation and a 54mm   cup was chosen.  The final 54mm Pinnacle cup  was then impacted under fluoroscopy  to confirm the depth of penetration and orientation with respect to   abduction.  A screw was placed followed by the hole eliminator.  The final   36+4 neutral Altrex liner was impacted with good visualized rim fit.  The cup was positioned anatomically within the acetabular portion of the pelvis.      At this point, the femur was rolled at 80 degrees.  Further capsule was   released off the inferior aspect of the femoral neck.  I then   released the superior capsule proximally.  The hook was placed laterally   along the femur and elevated manually and held in position with the bed   hook.  The leg was then extended and adducted with the leg rolled to 100   degrees of external rotation.  Once the proximal femur was fully   exposed, I used a box osteotome to set orientation.  I then began   broaching with the starting chili pepper broach and passed this by hand and then broached up to 5.  With the 5 broach in place I chose a high offset neck and did a trial reduction.  The offset was appropriate, leg lengths   appeared to be equal, confirmed radiographically.   Given these findings, I went ahead and dislocated the hip, repositioned all   retractors and positioned the right hip in the extended and abducted position.  The final 5 Hi Tri Lock stem was   chosen and it was impacted down to the level of neck cut.  Based on this   and the trial reduction, a 36+1.5 delta ceramic ball was chosen and   impacted onto a clean and dry trunnion, and the hip was reduced.  The   hip had been irrigated throughout the case again at this point.  I did   reapproximate the superior capsular leaflet to the anterior leaflet   using #1 Vicryl.  The fascia of the   tensor fascia lata muscle was then reapproximated using #1 Vicryl.  The   remaining wound was closed with 2-0 Vicryl and running 4-0 Monocryl.   The hip was cleaned, dried, and dressed sterilely using Dermabond and    Aquacel dressing.  He was then brought  to recovery room in stable condition tolerating the procedure well.    Lanney GinsMatthew Babish, PA-C was present for the entirety of the case involved from   preoperative positioning, perioperative retractor management, general   facilitation of the case, as well as primary wound closure as assistant.            Madlyn FrankelMatthew D. Charlann Boxerlin, M.D.        10/08/2014 9:00 AM

## 2014-10-08 NOTE — Anesthesia Postprocedure Evaluation (Signed)
  Anesthesia Post-op Note  Patient: Steven Contreras  Procedure(s) Performed: Procedure(s) (LRB): RIGHT TOTAL HIP ARTHROPLASTY ANTERIOR APPROACH (Right)  Patient Location: PACU  Anesthesia Type: Spinal  Level of Consciousness: awake and alert   Airway and Oxygen Therapy: Patient Spontanous Breathing  Post-op Pain: mild  Post-op Assessment: Post-op Vital signs reviewed, Patient's Cardiovascular Status Stable, Respiratory Function Stable, Patent Airway and No signs of Nausea or vomiting  Last Vitals:  Filed Vitals:   10/08/14 1126  BP: 126/69  Pulse: 69  Temp: 36.4 C  Resp:     Post-op Vital Signs: stable   Complications: No apparent anesthesia complications

## 2014-10-09 ENCOUNTER — Encounter (HOSPITAL_COMMUNITY): Payer: Self-pay | Admitting: Orthopedic Surgery

## 2016-05-17 DIAGNOSIS — Z471 Aftercare following joint replacement surgery: Secondary | ICD-10-CM | POA: Diagnosis not present

## 2016-05-17 DIAGNOSIS — Z96641 Presence of right artificial hip joint: Secondary | ICD-10-CM | POA: Diagnosis not present

## 2016-06-29 DIAGNOSIS — E119 Type 2 diabetes mellitus without complications: Secondary | ICD-10-CM | POA: Diagnosis not present

## 2016-06-29 DIAGNOSIS — I10 Essential (primary) hypertension: Secondary | ICD-10-CM | POA: Diagnosis not present

## 2016-06-29 DIAGNOSIS — E782 Mixed hyperlipidemia: Secondary | ICD-10-CM | POA: Diagnosis not present

## 2016-09-20 DIAGNOSIS — G4700 Insomnia, unspecified: Secondary | ICD-10-CM | POA: Diagnosis not present

## 2016-09-20 DIAGNOSIS — I10 Essential (primary) hypertension: Secondary | ICD-10-CM | POA: Diagnosis not present

## 2016-12-01 DIAGNOSIS — R05 Cough: Secondary | ICD-10-CM | POA: Diagnosis not present

## 2016-12-01 DIAGNOSIS — J018 Other acute sinusitis: Secondary | ICD-10-CM | POA: Diagnosis not present

## 2016-12-30 DIAGNOSIS — Z1159 Encounter for screening for other viral diseases: Secondary | ICD-10-CM | POA: Diagnosis not present

## 2016-12-30 DIAGNOSIS — Z79899 Other long term (current) drug therapy: Secondary | ICD-10-CM | POA: Diagnosis not present

## 2016-12-30 DIAGNOSIS — Z23 Encounter for immunization: Secondary | ICD-10-CM | POA: Diagnosis not present

## 2016-12-30 DIAGNOSIS — I10 Essential (primary) hypertension: Secondary | ICD-10-CM | POA: Diagnosis not present

## 2016-12-30 DIAGNOSIS — E119 Type 2 diabetes mellitus without complications: Secondary | ICD-10-CM | POA: Diagnosis not present

## 2016-12-30 DIAGNOSIS — Z0000 Encounter for general adult medical examination without abnormal findings: Secondary | ICD-10-CM | POA: Diagnosis not present

## 2016-12-30 DIAGNOSIS — E782 Mixed hyperlipidemia: Secondary | ICD-10-CM | POA: Diagnosis not present

## 2016-12-30 DIAGNOSIS — R42 Dizziness and giddiness: Secondary | ICD-10-CM | POA: Diagnosis not present

## 2017-01-04 DIAGNOSIS — R768 Other specified abnormal immunological findings in serum: Secondary | ICD-10-CM | POA: Diagnosis not present

## 2017-02-08 DIAGNOSIS — G4700 Insomnia, unspecified: Secondary | ICD-10-CM | POA: Diagnosis not present

## 2017-02-08 DIAGNOSIS — M6780 Other specified disorders of synovium and tendon, unspecified site: Secondary | ICD-10-CM | POA: Diagnosis not present

## 2017-02-08 DIAGNOSIS — R768 Other specified abnormal immunological findings in serum: Secondary | ICD-10-CM | POA: Diagnosis not present

## 2017-12-19 NOTE — Progress Notes (Signed)
Tawana ScaleZach  D.O. [facility] [address] [place], [place] [address] Phone: [phone] Subjective:    I'm seeing this patient by the request  of:    CC: Pain follow-up  BJY:NWGNFAOZHYHPI:Subjective  Steven LunaRobert K Contreras is a 62 y.o. male coming in with complaint of right hip replacement 3 years ago and he is now having pain in the groin. He also gets cramps in the glutes bilaterally. He also notes a tenderness in the calf over one spot. Also has numbness on top of feet. Patient notes that his right hip flexor seems weak. He sometimes has to use a cane to ambulate when his pain gets bad enough. He will take 3 steps and then the pain will go away and he can walk without the cane. Patient does fence. The right leg is externally rotated most of the time.  Patient states that this pain can be severe and 9 out of 10.  Sometimes feels like he has good stability.  Patient does not remember any true injury some.    Patient did have x-rays of the lower back and hip taken back in 2016 when patient had a replacement only.  Past Medical History:  Diagnosis Date  . ADD (attention deficit disorder)   . Anxiety    hx of  . Arthritis   . Family history of adverse reaction to anesthesia    mother slow to awaken  . Headache   . Hypertension   . Right rotator cuff tear    Past Surgical History:  Procedure Laterality Date  . colonscopy  2014  . SHOULDER SURGERY Left years ago   rotator, bicep and clavicle repair.  [name]. TOTAL HIP ARTHROPLASTY Right 10/08/2014   Procedure: RIGHT TOTAL HIP ARTHROPLASTY ANTERIOR APPROACH;  Surgeon: Durene RomansMatthew Olin, MD;  Location: [facility];  Service: Orthopedics;  Laterality: Right;   Social History   Socioeconomic History  . Marital status: Married    Spouse name: Not on file  . Number of children: Not on file  . Years of education: Not on file  . Highest education level: Not on file  Occupational History  . Not on file  Social Needs  . Financial resource strain: Not on file    . Food insecurity:    Worry: Not on file    Inability: Not on file  . Transportation needs:    Medical: Not on file    Non-medical: Not on file  Tobacco Use  . Smoking status: Former Smoker    Packs/day: 0.50    Years: 15.00    Pack years: 7.50    Types: Cigarettes    Last attempt to quit: 05/03/2004    Years since quitting: 13.6  . Smokeless tobacco: Never Used  Substance and Sexual Activity  . Alcohol use: Yes    Comment: 2 shots burbon per day or beer  . Drug use: No  . Sexual activity: Not on file  Lifestyle  . Physical activity:    Days per week: Not on file    Minutes per session: Not on file  . Stress: Not on file  Relationships  . Social connections:    Talks on phone: Not on file    Gets together: Not on file    Attends religious service: Not on file    Active member of club or organization: Not on file    Attends meetings of clubs or organizations: Not on file    Relationship status: Not on file  Other  Topics Concern  . Not on file  Social History Narrative  . Not on file   Allergies  Allergen Reactions  . Ampicillin     NOT sure if its ampicillin or amoxicillin---stomach cramps   No family history on file.   Past medical history, social, surgical and family history all reviewed in electronic medical record.  No pertanent information unless stated regarding to the chief complaint.   Review of Systems:Review of systems updated and as accurate as of 12/20/17  No headache, visual changes, nausea, vomiting, diarrhea, constipation, dizziness, abdominal pain, skin rash, fevers, chills, night sweats, weight loss, swollen lymph nodes, body aches, joint swelling,  chest pain, shortness of breath, mood changes.  Positive muscle aches  Objective  Blood pressure 130/90, pulse 72, height 5\' 8"  (1.727 m), weight 254 lb (115.2 kg), SpO2 97 %. Systems examined below as of 12/20/17   General: No apparent distress alert and oriented x3 mood and affect normal, dressed  appropriately.  HEENT: Pupils equal, extraocular movements intact  Respiratory: Patient's speak in full sentences and does not appear short of breath  Cardiovascular: No lower extremity edema, non tender, no erythema  Skin: Warm dry intact with no signs of infection or rash on extremities or on axial skeleton.  Abdomen: Soft nontender poor core strength and morbidly obese Neuro: Cranial nerves II through XII are intact, neurovascularly intact in all extremities with 2+ DTRs and 2+ pulses.  Lymph: No lymphadenopathy of posterior or anterior cervical chain or axillae bilaterally.  Gait normal with good balance and coordination.  MSK:  Non tender with full range of motion and good stability and symmetric strength and tone of shoulders, elbows, wrist,  knee and ankles bilaterally.  She does have some weakness in the right lower extremity compared to the left especially with hip flexion Back Exam:  Inspection: Loss of lordosis Motion: Flexion 45 deg, Extension 25 deg, Side Bending to 35 deg bilaterally,  Rotation to 35 deg bilaterally  SLR laying: Negative  XSLR laying: Negative  Palpable tenderness: Mild tenderness to palpation in the paraspinal musculature lumbar spine diffusely. FABER: Able to do secondary to weakness of the right lower extremity. Sensory change: Gross sensation intact to all lumbar and sacral dermatomes.  Reflexes: 2+ at both patellar tendons, 2+ at achilles tendons, Babinski's downgoing.  Strength at foot  Plantar-flexion: 5/5 Dorsi-flexion: 5/5 Eversion: 5/5 Inversion: 5/5  Leg strength  Hip flexion has 4 out of 5 strength compared to the contralateral side  Right hip shows patient does have some increased external rotation and even his natural hip on the contralateral side.  Mild clicking noted with internal rotation  [id number]; 15 additional minutes spent for Therapeutic exercises as stated in above notes.  This included exercises focusing on stretching, strengthening, with  significant focus on eccentric aspects.   Long term goals include an improvement in range of motion, strength, endurance as well as avoiding reinjury. Patient's frequency would include in 1-2 times a day, 3-5 times a week for a duration of 6-12 weeks.  Proper technique shown and discussed handout in great detail with ATC.  All questions were discussed and answered.      Impression and Recommendations:     This case required medical decision making of moderate complexity.      Note: This dictation was prepared with Dragon dictation along with smaller phrase technology. Any transcriptional errors that result from this process are unintentional.

## 2017-12-20 ENCOUNTER — Ambulatory Visit (INDEPENDENT_AMBULATORY_CARE_PROVIDER_SITE_OTHER)
Admission: RE | Admit: 2017-12-20 | Discharge: 2017-12-20 | Disposition: A | Discharge status: Home / Self Care | Payer: Commercial Managed Care - PPO | Source: Ambulatory Visit | Attending: Family Medicine | Admitting: Family Medicine

## 2017-12-20 ENCOUNTER — Ambulatory Visit: Payer: Commercial Managed Care - PPO | Admitting: Family Medicine

## 2017-12-20 ENCOUNTER — Other Ambulatory Visit (INDEPENDENT_AMBULATORY_CARE_PROVIDER_SITE_OTHER): Payer: Commercial Managed Care - PPO

## 2017-12-20 ENCOUNTER — Encounter: Payer: Self-pay | Admitting: Family Medicine

## 2017-12-20 VITALS — BP 130/90 | HR 72 | Ht 68.0 in | Wt 254.0 lb

## 2017-12-20 DIAGNOSIS — M5442 Lumbago with sciatica, left side: Secondary | ICD-10-CM

## 2017-12-20 DIAGNOSIS — M549 Dorsalgia, unspecified: Secondary | ICD-10-CM

## 2017-12-20 DIAGNOSIS — T8484XA Pain due to internal orthopedic prosthetic devices, implants and grafts, initial encounter: Secondary | ICD-10-CM

## 2017-12-20 DIAGNOSIS — M2550 Pain in unspecified joint: Secondary | ICD-10-CM

## 2017-12-20 DIAGNOSIS — M5441 Lumbago with sciatica, right side: Secondary | ICD-10-CM | POA: Diagnosis not present

## 2017-12-20 DIAGNOSIS — Z96649 Presence of unspecified artificial hip joint: Secondary | ICD-10-CM | POA: Insufficient documentation

## 2017-12-20 DIAGNOSIS — G8929 Other chronic pain: Secondary | ICD-10-CM

## 2017-12-20 DIAGNOSIS — M25551 Pain in right hip: Secondary | ICD-10-CM

## 2017-12-20 DIAGNOSIS — M48061 Spinal stenosis, lumbar region without neurogenic claudication: Secondary | ICD-10-CM | POA: Diagnosis not present

## 2017-12-20 LAB — COMPREHENSIVE METABOLIC PANEL
ALBUMIN: 4.6 g/dL (ref 3.5–5.2)
ALK PHOS: 120 U/L — AB (ref 39–117)
ALT: 76 U/L — AB (ref 0–53)
AST: 58 U/L — AB (ref 0–37)
BILIRUBIN TOTAL: 1 mg/dL (ref 0.2–1.2)
BUN: 12 mg/dL (ref 6–23)
CALCIUM: 10.3 mg/dL (ref 8.4–10.5)
CO2: 25 mEq/L (ref 19–32)
CREATININE: 0.87 mg/dL (ref 0.40–1.50)
Chloride: 100 mEq/L (ref 96–112)
GFR: 94.2 mL/min (ref >60.00)
Glucose, Bld: 388 mg/dL — ABNORMAL HIGH (ref 70–99)
Potassium: 4.1 mEq/L (ref 3.5–5.1)
Sodium: 135 mEq/L (ref 135–145)
Total Protein: 7.3 g/dL (ref 6.0–8.3)

## 2017-12-20 LAB — CBC WITH DIFFERENTIAL/PLATELET
BASOS ABS: 0 10*3/uL (ref 0.0–0.1)
Basophils Relative: 0.7 % (ref 0.0–3.0)
EOS ABS: 0.2 10*3/uL (ref 0.0–0.7)
Eosinophils Relative: 2.3 % (ref 0.0–5.0)
HCT: 47.7 % (ref 39.0–52.0)
HEMOGLOBIN: 15.9 g/dL (ref 13.0–17.0)
Lymphocytes Relative: 27.8 % (ref 12.0–46.0)
Lymphs Abs: 1.8 10*3/uL (ref 0.7–4.0)
MCHC: 33.4 g/dL (ref 30.0–36.0)
MCV: 85.7 fl (ref 78.0–100.0)
MONOS PCT: 9.6 % (ref 3.0–12.0)
Monocytes Absolute: 0.6 10*3/uL (ref 0.1–1.0)
Neutro Abs: 3.8 10*3/uL (ref 1.4–7.7)
Neutrophils Relative %: 59.6 % (ref 43.0–77.0)
PLATELETS: 226 10*3/uL (ref 150.0–400.0)
RBC: 5.57 Mil/uL (ref 4.22–5.81)
RDW: 12.7 % (ref 11.5–15.5)
WBC: 6.4 10*3/uL (ref 4.0–10.5)

## 2017-12-20 LAB — PSA: PSA: 0.68 ng/mL (ref 0.10–4.00)

## 2017-12-20 LAB — TESTOSTERONE: Testosterone: 202.94 ng/dL — ABNORMAL LOW (ref 300.00–890.00)

## 2017-12-20 LAB — IBC PANEL
Iron: 171 ug/dL — ABNORMAL HIGH (ref 42–165)
SATURATION RATIOS: 48.5 % (ref 20.0–50.0)
Transferrin: 252 mg/dL (ref 212.0–360.0)

## 2017-12-20 LAB — SEDIMENTATION RATE: Sed Rate: 22 mm/hr — ABNORMAL HIGH (ref 0–20)

## 2017-12-20 LAB — URIC ACID: Uric Acid, Serum: 2.7 mg/dL — ABNORMAL LOW (ref 4.0–7.8)

## 2017-12-20 LAB — VITAMIN D 25 HYDROXY (VIT D DEFICIENCY, FRACTURES): VITD: 17.94 ng/mL — ABNORMAL LOW (ref 30.00–100.00)

## 2017-12-20 LAB — TSH: TSH: 1.82 u[IU]/mL (ref 0.35–4.50)

## 2017-12-20 LAB — C-REACTIVE PROTEIN: CRP: 0.8 mg/dL (ref 0.5–20.0)

## 2017-12-20 MED ORDER — PREDNISONE 50 MG PO TABS: 50.0000 mg | ORAL_TABLET | Freq: Every day | ORAL | 0 refills | Status: DC

## 2017-12-20 MED ORDER — GABAPENTIN 100 MG PO CAPS: 200.0000 mg | ORAL_CAPSULE | Freq: Every day | ORAL | 3 refills | Status: DC

## 2017-12-20 NOTE — Patient Instructions (Addendum)
Great to meet you  Ice 20 minutes 2 times daily. Usually after activity and before bed. Labs and xrays downstairs Exercises 3 times a week.  Bike, elliptical or swim for cardio  Decrease weight in the gym a little for now.  Gabapentin 200mg  at night Prednisone daily for 5 days Over the counter get  Vitamin D 2000 IU daily  Tart cherry extract any dose at night See me again in 3-4 weeks

## 2017-12-20 NOTE — Assessment & Plan Note (Signed)
Concerned that patient does have more of a lumbar radiculopathy.  X-rays ordered today.  Possible loosening of the hip but nothing noted on exam today.  Concern for more of a spinal stenosis.  Discussed icing regimen, gabapentin and prednisone given today.  Home exercises given.  Laboratory work-up to further evaluate for any other organic causes that could be contributing to the pain.  Follow-up again in 4 weeks

## 2017-12-22 ENCOUNTER — Other Ambulatory Visit (INDEPENDENT_AMBULATORY_CARE_PROVIDER_SITE_OTHER): Payer: Commercial Managed Care - PPO

## 2017-12-22 DIAGNOSIS — R7309 Other abnormal glucose: Secondary | ICD-10-CM

## 2017-12-22 LAB — PTH, INTACT AND CALCIUM
Calcium: 9.9 mg/dL (ref 8.6–10.3)
PTH: 31 pg/mL (ref 14–64)

## 2017-12-22 LAB — CALCIUM, IONIZED: Calcium, Ion: 5.37 mg/dL (ref 4.8–5.6)

## 2017-12-22 LAB — CYCLIC CITRUL PEPTIDE ANTIBODY, IGG

## 2017-12-22 LAB — HEMOGLOBIN A1C: Hgb A1c MFr Bld: 9.5 % — ABNORMAL HIGH (ref 4.6–6.5)

## 2017-12-22 LAB — RHEUMATOID FACTOR: Rhuematoid fact SerPl-aCnc: <14 IU/mL (ref <14)

## 2017-12-22 LAB — ANA: Anti Nuclear Antibody(ANA): NEGATIVE

## 2017-12-22 LAB — ANGIOTENSIN CONVERTING ENZYME: Angiotensin-Converting Enzyme: 24 U/L (ref 9–67)

## 2017-12-28 ENCOUNTER — Telehealth: Payer: Self-pay | Admitting: Family Medicine

## 2017-12-28 DIAGNOSIS — Z96649 Presence of unspecified artificial hip joint: Principal | ICD-10-CM

## 2017-12-28 DIAGNOSIS — T8484XA Pain due to internal orthopedic prosthetic devices, implants and grafts, initial encounter: Secondary | ICD-10-CM

## 2017-12-28 NOTE — Telephone Encounter (Signed)
lmovm for pt to return call.  

## 2017-12-28 NOTE — Telephone Encounter (Signed)
Copied from CRM [phone]. Topic: Quick Communication - See Telephone Encounter >> Dec 28, 2017  9:36 AM Arlyss Gandyichardson, Gwyn Hieronymus N, NT wrote: CRM for notification. See Telephone encounter for: 12/28/17. Pt states during his last visit with Dr. Katrinka BlazingSmith, Dr. Katrinka BlazingSmith mentioned patient having a bone scan. Patient is checking status on that being ordered and scheduled. Please advise.

## 2018-01-04 ENCOUNTER — Encounter (HOSPITAL_COMMUNITY)
Admission: RE | Admit: 2018-01-04 | Discharge: 2018-01-04 | Disposition: A | Discharge status: Home / Self Care | Payer: Commercial Managed Care - PPO | Source: Ambulatory Visit | Attending: Family Medicine | Admitting: Family Medicine

## 2018-01-04 DIAGNOSIS — Z96641 Presence of right artificial hip joint: Secondary | ICD-10-CM | POA: Diagnosis not present

## 2018-01-04 DIAGNOSIS — T8484XA Pain due to internal orthopedic prosthetic devices, implants and grafts, initial encounter: Secondary | ICD-10-CM

## 2018-01-04 DIAGNOSIS — Z471 Aftercare following joint replacement surgery: Secondary | ICD-10-CM | POA: Diagnosis not present

## 2018-01-04 DIAGNOSIS — Z96649 Presence of unspecified artificial hip joint: Secondary | ICD-10-CM | POA: Insufficient documentation

## 2018-01-04 MED ORDER — TECHNETIUM TC 99M MEDRONATE IV KIT
20.0000 | PACK | Freq: Once | INTRAVENOUS | Status: AC | PRN
  Administered 2018-01-04: 20 via INTRAVENOUS

## 2018-01-06 DIAGNOSIS — E1165 Type 2 diabetes mellitus with hyperglycemia: Secondary | ICD-10-CM | POA: Diagnosis not present

## 2018-01-06 DIAGNOSIS — I10 Essential (primary) hypertension: Secondary | ICD-10-CM | POA: Diagnosis not present

## 2018-01-10 NOTE — Progress Notes (Signed)
[name] [address] [place], [place] [address] Phone: [phone] Subjective:   Steven Contreras, am serving as a scribe for Dr. Antoine Primas.    CC: Pain follow-up  [id number]  Steven Contreras is a 62 y.o. male coming in with complaint of hip pain. He has seen his primary care provider and is on insulin which they are still trying to regulate. Has been doing home exercise program and fencing. Pain today on lateral hip and groin on the left side. Replacement was on the right and he does note that he still feels a "catch" in that leg. Did start prednisone and had zero pain after a few days on the medication. Once prednisone course was completed, his pain came back but at a lesser degree. He feels that his pain is becoming more manageable. Does note that naproxen helped his pain years ago.   Patient did have x-rays at work and concerning for the possibility of loosening of the prosthesis.  Patient's bone scan did show that there was more bone remodeling but no true loosening appreciated.  Patient's knee is more optimistic that this point and may be able to have another replacement.    Past Medical History:  Diagnosis Date  . ADD (attention deficit disorder)   . Anxiety    hx of  . Arthritis   . Family history of adverse reaction to anesthesia    mother slow to awaken  . Headache   . Hypertension   . Right rotator cuff tear    Past Surgical History:  Procedure Laterality Date  . colonscopy  2014  . SHOULDER SURGERY Left years ago   rotator, bicep and clavicle repair.  [name] TOTAL HIP ARTHROPLASTY Right 10/08/2014   Procedure: RIGHT TOTAL HIP ARTHROPLASTY ANTERIOR APPROACH;  Surgeon: Durene Romans, MD;  Location: [facility];  Service: Orthopedics;  Laterality: Right;   Social History   Socioeconomic History  . Marital status: Married    Spouse name: Not on file  . Number of children: Not on file  . Years of education: Not on file  . Highest education  level: Not on file  Occupational History  . Not on file  Social Needs  . Financial resource strain: Not on file  . Food insecurity:    Worry: Not on file    Inability: Not on file  . Transportation needs:    Medical: Not on file    Non-medical: Not on file  Tobacco Use  . Smoking status: Former Smoker    Packs/day: 0.50    Years: 15.00    Pack years: 7.50    Types: Cigarettes    Last attempt to quit: 05/03/2004    Years since quitting: 13.7  . Smokeless tobacco: Never Used  Substance and Sexual Activity  . Alcohol use: Yes    Comment: 2 shots burbon per day or beer  . Drug use: No  . Sexual activity: Not on file  Lifestyle  . Physical activity:    Days per week: Not on file    Minutes per session: Not on file  . Stress: Not on file  Relationships  . Social connections:    Talks on phone: Not on file    Gets together: Not on file    Attends religious service: Not on file    Active member of club or organization: Not on file    Attends meetings of clubs or organizations: Not on file  Relationship status: Not on file  Other Topics Concern  . Not on file  Social History Narrative  . Not on file   Allergies  Allergen Reactions  . Ampicillin     NOT sure if its ampicillin or amoxicillin---stomach cramps   No family history on file.  Current Outpatient Medications (Endocrine & Metabolic):  .  predniSONE (DELTASONE) 50 MG tablet, Take 1 tablet (50 mg total) by mouth daily.  Current Outpatient Medications (Cardiovascular):  .  lisinopril (PRINIVIL,ZESTRIL) 10 MG tablet, Take 10 mg by mouth at bedtime.   Current Outpatient Medications (Analgesics):  .  oxyCODONE (OXY IR/ROXICODONE) 5 MG immediate release tablet, Take 1-3 tablets (5-15 mg total) by mouth every 4 (four) hours as needed for moderate pain or severe pain.   Current Outpatient Medications (Other):  .  citalopram (CELEXA) 20 MG tablet, Take 20 mg by mouth at bedtime. .  gabapentin (NEURONTIN) 100 MG  capsule, Take 2 capsules (200 mg total) by mouth at bedtime. [name]  lisdexamfetamine (VYVANSE) 30 MG capsule, Take 30 mg by mouth every morning. .  Menthol, Topical Analgesic, (ZIMS MAX-FREEZE EX), Apply 1 application topically 2 (two) times daily as needed (hip and shoulder.). [name]  Omega-3 Fatty Acids (FISH OIL PO), Take 3,600 mg by mouth every evening. .  polyvinyl alcohol (LIQUIFILM TEARS) 1.4 % ophthalmic solution, Place 1 drop into both eyes 4 (four) times daily as needed for dry eyes. .  Potassium 99 MG TABS, Take 1 tablet by mouth every evening. .  vitamin C (ASCORBIC ACID) 500 MG tablet, Take 500 mg by mouth every morning. .  Vitamin D, Ergocalciferol, (DRISDOL) 50000 units CAPS capsule, Take 1 capsule (50,000 Units total) by mouth every 7 (seven) days.    Past medical history, social, surgical and family history all reviewed in electronic medical record.  No pertanent information unless stated regarding to the chief complaint.   Review of Systems:  No headache, visual changes, nausea, vomiting, diarrhea, constipation, dizziness, abdominal pain, skin rash, fevers, chills, night sweats, weight loss, swollen lymph nodes, body aches, joint swelling, chest pain, shortness of breath, mood changes.  Positive muscle aches  Objective  Blood pressure 120/78, pulse 79, height 5\' 8"  (1.727 m), weight 256 lb (116.1 kg), SpO2 97 %.    General: No apparent distress alert and oriented x3 mood and affect normal, dressed appropriately.  HEENT: Pupils equal, extraocular movements intact  Respiratory: Patient's speak in full sentences and does not appear short of breath  Cardiovascular: No lower extremity edema, non tender, no erythema  Skin: Warm dry intact with no signs of infection or rash on extremities or on axial skeleton.  Abdomen: Soft nontender  Neuro: Cranial nerves II through XII are intact, neurovascularly intact in all extremities with 2+ DTRs and 2+ pulses.  Lymph: No lymphadenopathy of  posterior or anterior cervical chain or axillae bilaterally.  Gait normal with good balance and coordination.  MSK:  Non tender with full range of motion and good stability and symmetric strength and tone of shoulders, elbows, wrist,  knee and ankles bilaterally.  Back Exam:  Inspection: Loss of lordosis with poor core strength Motion: Flexion 45 deg, Extension 45 deg, Side Bending to 25 deg bilaterally, Rotation to 35 deg bilaterally  SLR laying: Negative  XSLR laying: Negative  Palpable tenderness: Tender to palpation of the paraspinal musculature lumbar spine. FABER: Tightness bilaterally. Sensory change: Gross sensation intact to all lumbar and sacral dermatomes.  Reflexes: 2+ at both patellar tendons, 2+  at achilles tendons, Babinski's downgoing.  Strength at foot  Plantar-flexion: 5/5 Dorsi-flexion: 5/5 Eversion: 5/5 Inversion: 5/5  Leg strength  Quad: 5/5 Hamstring: 5/5 Hip flexor: 5/5 Hip abductors: 4/5  Gait unremarkable.  Right hip exam shows some loss of course of internal range of motion.  Patient does have a clicking noted from flexion to extension noted.  This causes some discomfort and pain.    Impression and Recommendations:     This case required medical decision making of moderate complexity. The above documentation has been reviewed and is accurate and complete Judi Saa, DO       Note: This dictation was prepared with Dragon dictation along with smaller phrase technology. Any transcriptional errors that result from this process are unintentional.

## 2018-01-11 ENCOUNTER — Encounter: Payer: Self-pay | Admitting: Family Medicine

## 2018-01-11 ENCOUNTER — Ambulatory Visit: Payer: Commercial Managed Care - PPO | Admitting: Family Medicine

## 2018-01-11 DIAGNOSIS — T8484XA Pain due to internal orthopedic prosthetic devices, implants and grafts, initial encounter: Secondary | ICD-10-CM | POA: Diagnosis not present

## 2018-01-11 DIAGNOSIS — Z96649 Presence of unspecified artificial hip joint: Secondary | ICD-10-CM | POA: Diagnosis not present

## 2018-01-11 MED ORDER — VITAMIN D (ERGOCALCIFEROL) 1.25 MG (50000 UNIT) PO CAPS: 50000.0000 [IU] | ORAL_CAPSULE | ORAL | 0 refills | Status: DC

## 2018-01-11 NOTE — Patient Instructions (Signed)
Good to see you  Ice is your  Friend Once weekly vitamin d for 12 weeks, stop the daily  K2 over the counter for 1 month  Ok to increase activity but try to do lower impact exercises  See me again in 4 week

## 2018-01-11 NOTE — Assessment & Plan Note (Signed)
I believe the patient has not had complete healing at this time.  Likely secondary to the under diagnosed diabetes and poor healing.  Hopefully now with patient being on insulin the patient will make some difference in changes and improvement.  Discussed vitamin D patient is to do icing regimen and home exercises.  Patient is to avoid the significant amount of internal range of motion.  Patient will follow-up with me again in 4 to 6 weeks

## 2018-01-12 ENCOUNTER — Telehealth: Payer: Self-pay | Admitting: Family Medicine

## 2018-01-12 NOTE — Telephone Encounter (Signed)
Faxed labs

## 2018-01-12 NOTE — Telephone Encounter (Signed)
Copied from CRM [id number]. Topic: General - Other >> Jan 12, 2018  2:42 PM Maia Pettiesrtiz, Kristie S wrote: Dr. Johnn HaiSharon Walters PCP is requesting copy of most recent labs from Dr. Antoine PrimasZachary Smith for uncontrolled Diabetes. Please fax to [phone].

## 2018-02-06 NOTE — Progress Notes (Signed)
[name] [address] [place], [place] [address] Phone: [phone] Subjective:    I Steven Contreras am serving as a Neurosurgeon for Dr. Antoine Primas.   I  CC: Right hip pain follow-up  [id number]  PACEN Steven Contreras is a 62 y.o. male coming in with complaint of hip pain. States that his hip is doing better than it was last visit. Patient has lost 25 pounds.  Feels the vitamin D has help with stability.  Still has pain at the end of fencing.  Otherwise really unremarkable.  Has been able to do daily activities on a regular basis.  Happy with the results.  Feels that the feeling of instability is significantly less     Past Medical History:  Diagnosis Date  . ADD (attention deficit disorder)   . Anxiety    hx of  . Arthritis   . Family history of adverse reaction to anesthesia    mother slow to awaken  . Headache   . Hypertension   . Right rotator cuff tear    Past Surgical History:  Procedure Laterality Date  . colonscopy  2014  . SHOULDER SURGERY Left years ago   rotator, bicep and clavicle repair.  [name] TOTAL HIP ARTHROPLASTY Right 10/08/2014   Procedure: RIGHT TOTAL HIP ARTHROPLASTY ANTERIOR APPROACH;  Surgeon: Durene Romans, MD;  Location: [facility];  Service: Orthopedics;  Laterality: Right;   Social History   Socioeconomic History  . Marital status: Married    Spouse name: Not on file  . Number of children: Not on file  . Years of education: Not on file  . Highest education level: Not on file  Occupational History  . Not on file  Social Needs  . Financial resource strain: Not on file  . Food insecurity:    Worry: Not on file    Inability: Not on file  . Transportation needs:    Medical: Not on file    Non-medical: Not on file  Tobacco Use  . Smoking status: Former Smoker    Packs/day: 0.50    Years: 15.00    Pack years: 7.50    Types: Cigarettes    Last attempt to quit: 05/03/2004    Years since quitting: 13.7  . Smokeless  tobacco: Never Used  Substance and Sexual Activity  . Alcohol use: Yes    Comment: 2 shots burbon per day or beer  . Drug use: No  . Sexual activity: Not on file  Lifestyle  . Physical activity:    Days per week: Not on file    Minutes per session: Not on file  . Stress: Not on file  Relationships  . Social connections:    Talks on phone: Not on file    Gets together: Not on file    Attends religious service: Not on file    Active member of club or organization: Not on file    Attends meetings of clubs or organizations: Not on file    Relationship status: Not on file  Other Topics Concern  . Not on file  Social History Narrative  . Not on file   Allergies  Allergen Reactions  . Ampicillin     NOT sure if its ampicillin or amoxicillin---stomach cramps   No family history on file.  Current Outpatient Medications (Endocrine & Metabolic):  .  predniSONE (DELTASONE) 50 MG tablet, Take 1 tablet (50 mg total) by mouth daily.  Current Outpatient Medications (Cardiovascular):  .  lisinopril (PRINIVIL,ZESTRIL) 10 MG tablet, Take 10 mg by mouth at bedtime.     Current Outpatient Medications (Other):  .  citalopram (CELEXA) 20 MG tablet, Take 20 mg by mouth at bedtime. .  gabapentin (NEURONTIN) 100 MG capsule, Take 2 capsules (200 mg total) by mouth at bedtime. [name]  lisdexamfetamine (VYVANSE) 30 MG capsule, Take 30 mg by mouth every morning. .  Menthol, Topical Analgesic, (ZIMS MAX-FREEZE EX), Apply 1 application topically 2 (two) times daily as needed (hip and shoulder.). [name]  Omega-3 Fatty Acids (FISH OIL PO), Take 3,600 mg by mouth every evening. .  polyvinyl alcohol (LIQUIFILM TEARS) 1.4 % ophthalmic solution, Place 1 drop into both eyes 4 (four) times daily as needed for dry eyes. .  Potassium 99 MG TABS, Take 1 tablet by mouth every evening. .  vitamin C (ASCORBIC ACID) 500 MG tablet, Take 500 mg by mouth every morning.    Past medical history, social, surgical and family  history all reviewed in electronic medical record.  No pertanent information unless stated regarding to the chief complaint.   Review of Systems:  No headache, visual changes, nausea, vomiting, diarrhea, constipation, dizziness, abdominal pain, skin rash, fevers, chills, night sweats, weight loss, swollen lymph nodes, body aches, joint swelling, s, chest pain, shortness of breath, mood changes.  Positive muscle aches  Objective  Blood pressure 132/80, pulse 75, height 5\' 8"  (1.727 m), weight 254 lb (115.2 kg), SpO2 96 %.    General: No apparent distress alert and oriented x3 mood and affect normal, dressed appropriately.  HEENT: Pupils equal, extraocular movements intact  Respiratory: Patient's speak in full sentences and does not appear short of breath  Cardiovascular: No lower extremity edema, non tender, no erythema  Skin: Warm dry intact with no signs of infection or rash on extremities or on axial skeleton.  Abdomen: Soft nontender  Neuro: Cranial nerves II through XII are intact, neurovascularly intact in all extremities with 2+ DTRs and 2+ pulses.  Lymph: No lymphadenopathy of posterior or anterior cervical chain or axillae bilaterally.  Gait very mild antalgic.  MSK:  Non tender with full range of motion and good stability and symmetric strength and tone of shoulders, elbows, wrist, , knee and ankles bilaterally.  Right hip exam shows the patient does have some mild decrease in internal range of motion.  No clicking noted.  Patient is nontender on palpation today.    Impression and Recommendations:     The above documentation has been reviewed and is accurate and complete Judi Saa, DO       Note: This dictation was prepared with Dragon dictation along with smaller phrase technology. Any transcriptional errors that result from this process are unintentional.

## 2018-02-08 ENCOUNTER — Ambulatory Visit: Payer: Commercial Managed Care - PPO | Admitting: Family Medicine

## 2018-02-08 ENCOUNTER — Encounter: Payer: Self-pay | Admitting: Family Medicine

## 2018-02-08 DIAGNOSIS — Z96649 Presence of unspecified artificial hip joint: Secondary | ICD-10-CM

## 2018-02-08 DIAGNOSIS — T8484XD Pain due to internal orthopedic prosthetic devices, implants and grafts, subsequent encounter: Secondary | ICD-10-CM

## 2018-02-08 NOTE — Patient Instructions (Signed)
I am proud of you  Keep it up  See me again in 2 months

## 2018-02-08 NOTE — Assessment & Plan Note (Signed)
Significant improvement with the home exercises, vitamin D, and weight loss.  Encourage patient continue with weight loss.  Follow-up with me again 2 months

## 2018-02-20 DIAGNOSIS — Z1211 Encounter for screening for malignant neoplasm of colon: Secondary | ICD-10-CM | POA: Diagnosis not present

## 2018-02-20 DIAGNOSIS — Z79899 Other long term (current) drug therapy: Secondary | ICD-10-CM | POA: Diagnosis not present

## 2018-02-20 DIAGNOSIS — E1165 Type 2 diabetes mellitus with hyperglycemia: Secondary | ICD-10-CM | POA: Diagnosis not present

## 2018-02-20 DIAGNOSIS — I10 Essential (primary) hypertension: Secondary | ICD-10-CM | POA: Diagnosis not present

## 2018-02-20 DIAGNOSIS — E782 Mixed hyperlipidemia: Secondary | ICD-10-CM | POA: Diagnosis not present

## 2018-02-20 DIAGNOSIS — Z0000 Encounter for general adult medical examination without abnormal findings: Secondary | ICD-10-CM | POA: Diagnosis not present

## 2018-04-10 NOTE — Progress Notes (Signed)
[name] [address] [place], [place] [address] Phone: [phone] Subjective:    I Steven Contreras am serving as a Neurosurgeon for Dr. Antoine Primas.    CC: Hip pain follow-up  Steven Contreras  ANSEN Contreras is a 62 y.o. male coming in with complaint of hip pain. Has some questions today. Felt pain in his groin last week while fencing. Also knee pain. Believes the Vitamin D helps. Has been off the Vitamin D for a month.  Patient has slowed down on his weight loss.  Find that little frustrating.  Has had times where he has a little bit of pain but nothing severe.  Nothing that stops him from activity.  Patient is looking to start other type of exercises but wanted to ask first.  Patient was thinking about rowing and potentially biking.  Denies any new symptoms.     Past Medical History:  Diagnosis Date  . ADD (attention deficit disorder)   . Anxiety    hx of  . Arthritis   . Family history of adverse reaction to anesthesia    mother slow to awaken  . Headache   . Hypertension   . Right rotator cuff tear    Past Surgical History:  Procedure Laterality Date  . colonscopy  2014  . SHOULDER SURGERY Left years ago   rotator, bicep and clavicle repair.  Steven Contreras TOTAL HIP ARTHROPLASTY Right 10/08/2014   Procedure: RIGHT TOTAL HIP ARTHROPLASTY ANTERIOR APPROACH;  Surgeon: Durene Romans, MD;  Location: [facility];  Service: Orthopedics;  Laterality: Right;   Social History   Socioeconomic History  . Marital status: Married    Spouse name: Not on file  . Number of children: Not on file  . Years of education: Not on file  . Highest education level: Not on file  Occupational History  . Not on file  Social Needs  . Financial resource strain: Not on file  . Food insecurity:    Worry: Not on file    Inability: Not on file  . Transportation needs:    Medical: Not on file    Non-medical: Not on file  Tobacco Use  . Smoking status: Former Smoker    Packs/day:  0.50    Years: 15.00    Pack years: 7.50    Types: Cigarettes    Last attempt to quit: 05/03/2004    Years since quitting: 13.9  . Smokeless tobacco: Never Used  Substance and Sexual Activity  . Alcohol use: Yes    Comment: 2 shots burbon per day or beer  . Drug use: No  . Sexual activity: Not on file  Lifestyle  . Physical activity:    Days per week: Not on file    Minutes per session: Not on file  . Stress: Not on file  Relationships  . Social connections:    Talks on phone: Not on file    Gets together: Not on file    Attends religious service: Not on file    Active member of club or organization: Not on file    Attends meetings of clubs or organizations: Not on file    Relationship status: Not on file  Other Topics Concern  . Not on file  Social History Narrative  . Not on file   Allergies  Allergen Reactions  . Ampicillin     NOT sure if its ampicillin or amoxicillin---stomach cramps   No family history on file.   Current Outpatient  Medications (Cardiovascular):  .  lisinopril (PRINIVIL,ZESTRIL) 10 MG tablet, Take 10 mg by mouth at bedtime.     Current Outpatient Medications (Other):  .  citalopram (CELEXA) 20 MG tablet, Take 20 mg by mouth at bedtime. .  gabapentin (NEURONTIN) 100 MG capsule, Take 2 capsules (200 mg total) by mouth at bedtime. Steven Contreras.  lisdexamfetamine (VYVANSE) 30 MG capsule, Take 30 mg by mouth every morning. .  Menthol, Topical Analgesic, (ZIMS MAX-FREEZE EX), Apply 1 application topically 2 (two) times daily as needed (hip and shoulder.). Steven Contreras.  Omega-3 Fatty Acids (FISH OIL PO), Take 3,600 mg by mouth every evening. .  polyvinyl alcohol (LIQUIFILM TEARS) 1.4 % ophthalmic solution, Place 1 drop into both eyes 4 (four) times daily as needed for dry eyes. .  Potassium 99 MG TABS, Take 1 tablet by mouth every evening. .  vitamin C (ASCORBIC ACID) 500 MG tablet, Take 500 mg by mouth every morning.    Past medical history, social, surgical and family  history all reviewed in electronic medical record.  No pertanent information unless stated regarding to the chief complaint.   Review of Systems:  No headache, visual changes, nausea, vomiting, diarrhea, constipation, dizziness, abdominal pain, skin rash, fevers, chills, night sweats, weight loss, swollen lymph nodes, body aches, joint swelling, , chest pain, shortness of breath, mood changes.  Positive muscle aches  Objective  Blood pressure 140/84, pulse 70, height 5\' 8"  (1.727 m), weight 255 lb (115.7 kg), SpO2 98 %.    General: No apparent distress alert and oriented x3 mood and affect normal, dressed appropriately.  HEENT: Pupils equal, extraocular movements intact  Respiratory: Patient's speak in full sentences and does not appear short of breath  Cardiovascular: Trace lower extremity edema, non tender, no erythema  Skin: Warm dry intact with no signs of infection or rash on extremities or on axial skeleton.  Abdomen: Soft obese Neuro: Cranial nerves II through XII are intact, neurovascularly intact in all extremities with 2+ DTRs and 2+ pulses.  Lymph: No lymphadenopathy of posterior or anterior cervical chain or axillae bilaterally.  Gait antalgic gait MSK:  Non tender with full range of motion and good stability and symmetric strength and tone of shoulders, elbows, wrist,  knee and ankles bilaterally.  Hip exam shows patient does have good stability of the replacement.  Patient still has a clicking with external rotation.  Neurovascular intact distally.  4+ out of 5 strength compared to the contralateral side.    Impression and Recommendations:      The above documentation has been reviewed and is accurate and complete Judi SaaZachary M Smith, DO       Note: This dictation was prepared with Dragon dictation along with smaller phrase technology. Any transcriptional errors that result from this process are unintentional.

## 2018-04-11 ENCOUNTER — Ambulatory Visit: Payer: Commercial Managed Care - PPO | Admitting: Family Medicine

## 2018-04-11 ENCOUNTER — Encounter: Payer: Self-pay | Admitting: Family Medicine

## 2018-04-11 DIAGNOSIS — Z96649 Presence of unspecified artificial hip joint: Secondary | ICD-10-CM

## 2018-04-11 DIAGNOSIS — T8484XD Pain due to internal orthopedic prosthetic devices, implants and grafts, subsequent encounter: Secondary | ICD-10-CM | POA: Diagnosis not present

## 2018-04-11 NOTE — Assessment & Plan Note (Signed)
Doing much better at this time.  Discussed with patient to continue conservative therapy.  Continue the vitamin D supplementation, and we discussed different diet changes to help him aid in his weight loss.  Patient as long as doing well will follow up with me again in 3 months

## 2018-04-11 NOTE — Patient Instructions (Signed)
Good to see you  Exercises 3 times a week.  Keep working on the weight I am proud of you already! Add biking to the cardio Rowing is fine but watch the back  Carb right after working out and before 6 pm  Maybe check in with me in 3 months so you can show off ;)

## 2018-04-30 NOTE — Progress Notes (Signed)
[name] [facility] [address] [place], [place] [address] Phone: [phone] Subjective:   Steven Contreras, [name], am serving as a scribe for Dr. Antoine PrimasZachary Cornie Herrington.    CC: Back pain  BJY:[id number]:Subjective  Steven LunaRobert K Contreras is a 62 y.o. male coming in with complaint of right hip pain. He feels intermittent pain that occurs in the right groin. Patient is complaining of pressure in lumbar spine and cramping in his legs. Patient has been fencing and doing yard work. Patient has been walking and feels that this exacerbates pain in SI joint.  Patient states it is always seems to be localized on the right side.  Minimal radicular pain.  Discussed icing regimen and home exercise patient has been doing it for occasionally.  Patient states sometimes he gets stuck in a flexed position in the back and he takes multiple minutes to be able to stand up straight.  Denies any weakness of the lower extremities.    Past Medical History:  Diagnosis Date  . ADD (attention deficit disorder)   . Anxiety    hx of  . Arthritis   . Family history of adverse reaction to anesthesia    mother slow to awaken  . Headache   . Hypertension   . Right rotator cuff tear    Past Surgical History:  Procedure Laterality Date  . colonscopy  2014  . SHOULDER SURGERY Left years ago   rotator, bicep and clavicle repair.  [name]. TOTAL HIP ARTHROPLASTY Right 10/08/2014   Procedure: RIGHT TOTAL HIP ARTHROPLASTY ANTERIOR APPROACH;  Surgeon: Durene RomansMatthew Olin, MD;  Location: [facility];  Service: Orthopedics;  Laterality: Right;   Social History   Socioeconomic History  . Marital status: Married    Spouse name: Not on file  . Number of children: Not on file  . Years of education: Not on file  . Highest education level: Not on file  Occupational History  . Not on file  Social Needs  . Financial resource strain: Not on file  . Food insecurity:    Worry: Not on file    Inability: Not on file  . Transportation needs:    Medical:  Not on file    Non-medical: Not on file  Tobacco Use  . Smoking status: Former Smoker    Packs/day: 0.50    Years: 15.00    Pack years: 7.50    Types: Cigarettes    Last attempt to quit: 05/03/2004    Years since quitting: 14.0  . Smokeless tobacco: Never Used  Substance and Sexual Activity  . Alcohol use: Yes    Comment: 2 shots burbon per day or beer  . Drug use: No  . Sexual activity: Not on file  Lifestyle  . Physical activity:    Days per week: Not on file    Minutes per session: Not on file  . Stress: Not on file  Relationships  . Social connections:    Talks on phone: Not on file    Gets together: Not on file    Attends religious service: Not on file    Active member of club or organization: Not on file    Attends meetings of clubs or organizations: Not on file    Relationship status: Not on file  Other Topics Concern  . Not on file  Social History Narrative  . Not on file   Allergies  Allergen Reactions  . Ampicillin     NOT sure if its ampicillin or  amoxicillin---stomach cramps   No family history on file.  Current Outpatient Medications (Endocrine & Metabolic):  [name].  Testosterone 30 MG MISC, Place 50 mg inside cheek.  Current Outpatient Medications (Cardiovascular):  .  lisinopril (PRINIVIL,ZESTRIL) 10 MG tablet, Take 10 mg by mouth at bedtime.   Current Outpatient Medications (Analgesics):  .  meloxicam (MOBIC) 15 MG tablet, Take 1 tablet (15 mg total) by mouth daily.   Current Outpatient Medications (Other):  .  cholecalciferol (VITAMIN D) 1000 units tablet, Take 1,000 Units by mouth daily. .  citalopram (CELEXA) 20 MG tablet, Take 20 mg by mouth at bedtime. [name].  lisdexamfetamine (VYVANSE) 30 MG capsule, Take 30 mg by mouth every morning. .  Menthol, Topical Analgesic, (ZIMS MAX-FREEZE EX), Apply 1 application topically 2 (two) times daily as needed (hip and shoulder.). [name].  Misc Natural Products (TART CHERRY ADVANCED PO), Take by mouth. .  Omega-3 Fatty  Acids (FISH OIL PO), Take 3,600 mg by mouth every evening. .  polyvinyl alcohol (LIQUIFILM TEARS) 1.4 % ophthalmic solution, Place 1 drop into both eyes 4 (four) times daily as needed for dry eyes. .  Potassium 99 MG TABS, Take 1 tablet by mouth every evening. .  vitamin C (ASCORBIC ACID) 500 MG tablet, Take 500 mg by mouth every morning.    Past medical history, social, surgical and family history all reviewed in electronic medical record.  No pertanent information unless stated regarding to the chief complaint.   Review of Systems:  No headache, visual changes, nausea, vomiting, diarrhea, constipation, dizziness, abdominal pain, skin rash, fevers, chills, night sweats, weight loss, swollen lymph nodes, body aches, joint swelling,  chest pain, shortness of breath, mood changes.  Positive muscle aches  Objective  Blood pressure 118/72, pulse 78, height 5\' 8"  (1.727 m), weight 258 lb (117 kg), SpO2 98 %.   General: No apparent distress alert and oriented x3 mood and affect normal, dressed appropriately.  HEENT: Pupils equal, extraocular movements intact  Respiratory: Patient's speak in full sentences and does not appear short of breath  Cardiovascular: lower extremity edema, non tender, no erythema  Skin: Warm dry intact with no signs of infection or rash on extremities or on axial skeleton.  Abdomen: Soft nontender  Neuro: Cranial nerves II through XII are intact, neurovascularly intact in all extremities with 2+ DTRs and 2+ pulses.  Lymph: No lymphadenopathy of posterior or anterior cervical chain or axillae bilaterally.  Gait mild antalgic MSK:  Non tender with full range of motion and good stability and symmetric strength and tone of shoulders, elbows, wrist, , knee and ankles bilaterally.  Back Exam:  Inspection: Loss of lordosis noted poor core strength  Motion: Flexion 40 deg, Extension 25 deg, Side Bending to 45 deg bilaterally,  Rotation to 45 deg bilaterally  SLR laying:  Negative  XSLR laying: Negative  Palpable tenderness: Tender to palpation the paraspinal musculature.  Right sacroiliac joint FABER: Tightness right greater than left. Sensory change: Gross sensation intact to all lumbar and sacral dermatomes.  Reflexes: 2+ at both patellar tendons, 2+ at achilles tendons, Babinski's downgoing.  Strength at foot  Plantar-flexion: 5/5 Dorsi-flexion: 5/5 Eversion: 5/5 Inversion: 5/5  Leg strength  Quad: 5/5 Hamstring: 5/5 Hip flexor: 5/5 Hip abductors: 4/5 systemic  Osteopathic findingsleft C6 flexed rotated and side bent left T5 extended rotated and side bent right inhaled rib T9 extended rotated and side bent left L2 flexed rotated and side bent right Sacrum right on right  Impression and Recommendations:     This case required medical decision making of moderate complexity. The above documentation has been reviewed and is accurate and complete Lyndal Pulley, DO       Note: This dictation was prepared with Dragon dictation along with smaller phrase technology. Any transcriptional errors that result from this process are unintentional.

## 2018-05-01 ENCOUNTER — Encounter: Payer: Self-pay | Admitting: Family Medicine

## 2018-05-01 ENCOUNTER — Ambulatory Visit: Payer: Commercial Managed Care - PPO | Admitting: Family Medicine

## 2018-05-01 DIAGNOSIS — M999 Biomechanical lesion, unspecified: Secondary | ICD-10-CM | POA: Insufficient documentation

## 2018-05-01 DIAGNOSIS — M9904 Segmental and somatic dysfunction of sacral region: Secondary | ICD-10-CM | POA: Diagnosis not present

## 2018-05-01 DIAGNOSIS — M47818 Spondylosis without myelopathy or radiculopathy, sacral and sacrococcygeal region: Secondary | ICD-10-CM

## 2018-05-01 MED ORDER — MELOXICAM 15 MG PO TABS: 15.0000 mg | ORAL_TABLET | Freq: Every day | ORAL | 0 refills | Status: DC

## 2018-05-01 NOTE — Patient Instructions (Signed)
Good to see you  Ice is your friend Stay active Tried manipulation today  COntinue ithe exercises Meloxicam daily for 10 days then stop and see how you fell. If you feel like you need it again then take it for 5 days in a row each time.  See me again in 4-5 weeks

## 2018-05-01 NOTE — Assessment & Plan Note (Signed)
Sacroiliac dysfunction of the right SI joint.  Likely some arthritic changes.  Responded well to manipulation.  Discussed icing regimen and home exercise.  Discussed core strengthening.  Discussed topical anti-inflammatories.  Meloxicam also given.  Warned of long-term use.  Follow-up again in 4 to 8 weeks

## 2018-05-01 NOTE — Assessment & Plan Note (Signed)
Decision today to treat with OMT was based on Physical Exam  After verbal consent patient was treated with HVLA, ME, FPR techniques in cervical, thoracic, rib lumbar and sacral areas  Patient tolerated the procedure well with improvement in symptoms  Patient given exercises, stretches and lifestyle modifications  See medications in patient instructions if given  Patient will follow up in 4-8 weeks 

## 2018-05-08 ENCOUNTER — Other Ambulatory Visit: Payer: Self-pay | Admitting: *Deleted

## 2018-05-08 MED ORDER — MELOXICAM 15 MG PO TABS: 15.0000 mg | ORAL_TABLET | Freq: Every day | ORAL | 0 refills | Status: DC

## 2018-05-08 NOTE — Telephone Encounter (Signed)
Refill request for meloxicam 15mg .  Rx sent to pharmacy.

## 2018-08-22 ENCOUNTER — Other Ambulatory Visit: Payer: Self-pay | Admitting: Family Medicine

## 2018-08-22 NOTE — Telephone Encounter (Signed)
Left message to call back for virtual visit for rx requests.

## 2018-08-23 NOTE — Telephone Encounter (Signed)
Left message for patient to call back to schedule virtual visit.  

## 2018-09-07 ENCOUNTER — Encounter: Payer: Self-pay | Admitting: Allergy

## 2018-09-07 ENCOUNTER — Other Ambulatory Visit: Payer: Self-pay

## 2018-09-07 ENCOUNTER — Ambulatory Visit (INDEPENDENT_AMBULATORY_CARE_PROVIDER_SITE_OTHER): Payer: Commercial Managed Care - PPO | Admitting: Allergy

## 2018-09-07 VITALS — BP 144/88 | HR 76 | Temp 97.9°F | Resp 20 | Ht 67.0 in | Wt 265.4 lb

## 2018-09-07 DIAGNOSIS — R053 Chronic cough: Secondary | ICD-10-CM | POA: Insufficient documentation

## 2018-09-07 DIAGNOSIS — R05 Cough: Secondary | ICD-10-CM | POA: Diagnosis not present

## 2018-09-07 DIAGNOSIS — J3089 Other allergic rhinitis: Secondary | ICD-10-CM | POA: Diagnosis not present

## 2018-09-07 MED ORDER — ALBUTEROL SULFATE HFA 108 (90 BASE) MCG/ACT IN AERS: 2.0000 | INHALATION_SPRAY | Freq: Four times a day (QID) | RESPIRATORY_TRACT | 1 refills | Status: DC | PRN

## 2018-09-07 MED ORDER — AZELASTINE-FLUTICASONE 137-50 MCG/ACT NA SUSP: 1.0000 | Freq: Two times a day (BID) | NASAL | 5 refills | Status: DC

## 2018-09-07 NOTE — Patient Instructions (Addendum)
Today's skin testing showed:  Mildly positive to grass and mold.    Start dymista 1 spray twice a day. This replaces Flonase.  Nasal saline spray (i.e., Simply Saline) or nasal saline lavage (i.e., NeilMed) is recommended as needed and prior to medicated nasal sprays.  May use over the counter antihistamines such as Zyrtec (cetirizine), Claritin (loratadine), Allegra (fexofenadine), or Xyzal (levocetirizine) daily as needed.   Start environmental control measures.  Get CXR  Stop vaping  May use albuterol rescue inhaler 2 puffs or nebulizer every 4 to 6 hours as needed for shortness of breath, chest tightness, coughing, and wheezing. May use albuterol rescue inhaler 2 puffs 5 to 15 minutes prior to strenuous physical activities. Monitor frequency of use.  Stop lisinopril and call your PCP about changing your medications.   Call in 2 weeks if not better and will start reflux medication.   Follow up in 4 weeks.   Reducing Pollen Exposure . Pollen seasons: trees (spring), grass (summer) and ragweed/weeds (fall). [name] Keep windows closed in your home and car to lower pollen exposure.  Lilian Kapur air conditioning in the bedroom and throughout the house if possible.  . Avoid going out in dry windy days - especially early morning. . Pollen counts are highest between 5 - 10 AM and on dry, hot and windy days.  . Save outside activities for late afternoon or after a heavy rain, when pollen levels are lower.  . Avoid mowing of grass if you have grass pollen allergy. [name] Be aware that pollen can also be transported indoors on people and pets.  . Dry your clothes in an automatic dryer rather than hanging them outside where they might collect pollen.  . Rinse hair and eyes before bedtime. Mold Control . Mold and fungi can grow on a variety of surfaces provided certain temperature and moisture conditions exist.  . Outdoor molds grow on plants, decaying vegetation and soil. The major outdoor mold,  Alternaria and Cladosporium, are found in very high numbers during hot and dry conditions. Generally, a late summer - fall peak is seen for common outdoor fungal spores. Rain will temporarily lower outdoor mold spore count, but counts rise rapidly when the rainy period ends. . The most important indoor molds are Aspergillus and Penicillium. Dark, humid and poorly ventilated basements are ideal sites for mold growth. The next most common sites of mold growth are the bathroom and the kitchen. Outdoor (Seasonal) Mold Control . Use air conditioning and keep windows closed. . Avoid exposure to decaying vegetation. [name] Avoid leaf raking. . Avoid grain handling. . Consider wearing a face mask if working in moldy areas.  Indoor (Perennial) Mold Control  . Maintain humidity below 50%. . Get rid of mold growth on hard surfaces with water, detergent and, if necessary, 5% bleach (do not mix with other cleaners). Then dry the area completely. If mold covers an area more than 10 square feet, consider hiring an indoor environmental professional. . For clothing, washing with soap and water is best. If moldy items cannot be cleaned and dried, throw them away. . Remove sources e.g. contaminated carpets. . Repair and seal leaking roofs or pipes. Using dehumidifiers in damp basements may be helpful, but empty the water and clean units regularly to prevent mildew from forming. All rooms, especially basements, bathrooms and kitchens, require ventilation and cleaning to deter mold and mildew growth. Avoid carpeting on concrete or damp floors, and storing items in damp areas.

## 2018-09-07 NOTE — Progress Notes (Deleted)
New Patient Note  RE: Steven Contreras MRN: [id number] DOB: [birth year] Date of Office Visit: 09/07/2018  Referring provider: Larkin Ina Primary care provider: Morrell Riddle, PA-C  Chief Complaint: Cough; Nasal Congestion; and Burning Eyes  History of Present Illness: I had the pleasure of seeing [name] for initial evaluation at the [facility] on 09/07/2018. He is a 63 y.o. male, who is referred here by [name], [name], PA-C for the evaluation of cough, nasal congestion, burning eyes.   ***  Assessment and Plan: [name] is a 63 y.o. male with: No problem-specific Assessment & Plan notes found for this encounter.  No follow-ups on file.  No orders of the defined types were placed in this encounter.  Lab Orders  No laboratory test(s) ordered today    Other allergy screening: Asthma: {Blank single:19197::"yes","no"} Rhino conjunctivitis: {Blank single:19197::"yes","no"} Food allergy: {Blank single:19197::"yes","no"} Medication allergy: {Blank single:19197::"yes","no"} Hymenoptera allergy: {Blank single:19197::"yes","no"} Urticaria: {Blank single:19197::"yes","no"} Eczema:{Blank single:19197::"yes","no"} History of recurrent infections suggestive of immunodeficency: {Blank single:19197::"yes","no"}  Diagnostics: Spirometry:  Tracings reviewed. His effort: {Blank single:19197::"Good reproducible efforts.","It was hard to get consistent efforts and there is a question as to whether this reflects a maximal maneuver.","Poor effort, data can not be interpreted."} FVC: ***L FEV1: ***L, ***% predicted FEV1/FVC ratio: ***% Interpretation: {Blank single:19197::"Spirometry consistent with mild obstructive disease","Spirometry consistent with moderate obstructive disease","Spirometry consistent with severe obstructive disease","Spirometry consistent with possible restrictive disease","Spirometry consistent with mixed obstructive and restrictive  disease","Spirometry uninterpretable due to technique","Spirometry consistent with normal pattern","No overt abnormalities noted given today's efforts"}.  Please see scanned spirometry results for details.  Skin Testing: {Blank single:19197::"Select foods","Environmental allergy panel","Environmental allergy panel and select foods","Food allergy panel","None","Deferred due to recent antihistamines use"}. Positive test to: ***. Negative test to: ***.  Results discussed with patient/family.   Past Medical History: Patient Active Problem List   Diagnosis Date Noted  . Somatic dysfunction of right sacroiliac joint 05/01/2018  . Arthritis of right sacroiliac joint 05/01/2018  . Nonallopathic lesion of thoracic region 05/01/2018  . Nonallopathic lesion of lumbosacral region 05/01/2018  . Nonallopathic lesion of rib cage 05/01/2018  . Nonallopathic lesion of cervical region 05/01/2018  . Chronic back pain 12/20/2017  . Pain in hip region after total hip replacement (HCC) 12/20/2017  . S/P right THA, AA 10/08/2014   Past Medical History:  Diagnosis Date  . ADD (attention deficit disorder)   . Anxiety    hx of  . Arthritis   . Family history of adverse reaction to anesthesia    mother slow to awaken  . Headache   . Hypertension   . Right rotator cuff tear    Past Surgical History: Past Surgical History:  Procedure Laterality Date  . colonscopy  2014  . SHOULDER SURGERY Left years ago   rotator, bicep and clavicle repair.  [name] TOTAL HIP ARTHROPLASTY Right 10/08/2014   Procedure: RIGHT TOTAL HIP ARTHROPLASTY ANTERIOR APPROACH;  Surgeon: Durene Romans, MD;  Location: [facility];  Service: Orthopedics;  Laterality: Right;   Medication List:  Current Outpatient Medications  Medication Sig Dispense Refill  . cholecalciferol (VITAMIN D) 1000 units tablet Take 1,000 Units by mouth daily.    . citalopram (CELEXA) 20 MG tablet Take 20 mg by mouth at bedtime.    [name] lisinopril (PRINIVIL,ZESTRIL) 10  MG tablet Take 10 mg by mouth at bedtime.    . meloxicam (MOBIC) 15 MG tablet Take 1 tablet (15 mg total) by mouth daily. 90 tablet  0  . Misc Natural Products (TART CHERRY ADVANCED PO) Take by mouth.    . Omega-3 Fatty Acids (FISH OIL PO) Take 3,600 mg by mouth every evening.    . polyvinyl alcohol (LIQUIFILM TEARS) 1.4 % ophthalmic solution Place 1 drop into both eyes 4 (four) times daily as needed for dry eyes.    . Potassium 99 MG TABS Take 1 tablet by mouth every evening.    . Testosterone 30 MG MISC Place 50 mg inside cheek.    . lisdexamfetamine (VYVANSE) 30 MG capsule Take 30 mg by mouth every morning.    . Menthol, Topical Analgesic, (ZIMS MAX-FREEZE EX) Apply 1 application topically 2 (two) times daily as needed (hip and shoulder.).    [name]. vitamin C (ASCORBIC ACID) 500 MG tablet Take 500 mg by mouth every morning.     No current facility-administered medications for this visit.    Allergies: Allergies  Allergen Reactions  . Ampicillin     NOT sure if its ampicillin or amoxicillin---stomach cramps   Social History: Social History   Socioeconomic History  . Marital status: Married    Spouse name: Not on file  . Number of children: Not on file  . Years of education: Not on file  . Highest education level: Not on file  Occupational History  . Not on file  Social Needs  . Financial resource strain: Not on file  . Food insecurity:    Worry: Not on file    Inability: Not on file  . Transportation needs:    Medical: Not on file    Non-medical: Not on file  Tobacco Use  . Smoking status: Former Smoker    Packs/day: 0.50    Years: 15.00    Pack years: 7.50    Types: Cigarettes    Last attempt to quit: 05/03/2004    Years since quitting: 14.3  . Smokeless tobacco: Never Used  Substance and Sexual Activity  . Alcohol use: Yes    Comment: 2 shots burbon per day or beer  . Drug use: No  . Sexual activity: Not on file  Lifestyle  . Physical activity:    Days per week: Not  on file    Minutes per session: Not on file  . Stress: Not on file  Relationships  . Social connections:    Talks on phone: Not on file    Gets together: Not on file    Attends religious service: Not on file    Active member of club or organization: Not on file    Attends meetings of clubs or organizations: Not on file    Relationship status: Not on file  Other Topics Concern  . Not on file  Social History Narrative  . Not on file   Lives in a ***. Smoking: *** Occupation: ***  Environmental HistorySurveyor, minerals: Water Damage/mildew in the house: Copywriter, advertising{Blank single:19197::"yes","no"} Carpet in the family room: {Blank single:19197::"yes","no"} Carpet in the bedroom: {Blank single:19197::"yes","no"} Heating: {Blank single:19197::"electric","gas"} Cooling: {Blank single:19197::"central","window"} Pet: {Blank single:19197::"yes ***","no"}  Family History: No family history on file. Problem                               Relation Asthma                                   *** Eczema                                ***  Food allergy                          *** Allergic rhino conjunctivitis     ***  Review of Systems  Constitutional: Negative for appetite change, chills, fever and unexpected weight change.  HENT: Negative for congestion and rhinorrhea.   Eyes: Negative for itching.  Respiratory: Negative for cough, chest tightness, shortness of breath and wheezing.   Cardiovascular: Negative for chest pain.  Gastrointestinal: Negative for abdominal pain.  Genitourinary: Negative for difficulty urinating.  Skin: Negative for rash.  Allergic/Immunologic: Negative for environmental allergies and food allergies.  Neurological: Negative for headaches.   Objective: There were no vitals taken for this visit. There is no height or weight on file to calculate BMI. Physical Exam  Constitutional: He is oriented to person, place, and time. He appears well-developed and well-nourished.  HENT:  Head:  Normocephalic and atraumatic.  Right Ear: External ear normal.  Left Ear: External ear normal.  Nose: Nose normal.  Mouth/Throat: Oropharynx is clear and moist.  Eyes: Conjunctivae and EOM are normal.  Neck: Neck supple.  Cardiovascular: Normal rate, regular rhythm and normal heart sounds. Exam reveals no gallop and no friction rub.  No murmur heard. Pulmonary/Chest: Effort normal and breath sounds normal. He has no wheezes. He has no rales.  Abdominal: Soft.  Neurological: He is alert and oriented to person, place, and time.  Skin: Skin is warm. No rash noted.  Psychiatric: He has a normal mood and affect. His behavior is normal.  Nursing note and vitals reviewed.  The plan was reviewed with the patient/family, and all questions/concerned were addressed.  It was my pleasure to see Geovanie today and participate in his care. Please feel free to contact me with any questions or concerns.  Sincerely,  Wyline Mood, DO Allergy & Immunology  [facility] office: [phone] [facility] office: [phone]

## 2018-09-07 NOTE — Assessment & Plan Note (Signed)
Mostly non-productive persistent cough for the past 5 months. Initially thought to be infectious but 2 courses of antibiotics and prednisone later the cough is still not improved. No triggers noted. No prior history of respiratory issues or GERD. Flonase seemed to help the most. Antihistamines and mucinex not effective. No recent CXR.  Today's skin testing showed: Mildly positive to grass and mold.   Today's spirometry was normal with no improvement in FEV1 post bronchodilator treatment.  The most common causes of chronic cough include the following: upper airway cough syndrome (UACS) which is caused by variety of rhinitis conditions; asthma; gastroesophageal reflux disease (GERD); chronic bronchitis from cigarette smoking or other inhaled environmental irritants; non-asthmatic eosinophilic bronchitis; and bronchiectasis.  In prospective studies, these conditions have accounted for up to 94% of the causes of chronic cough in immunocompetent adults.  The history and physical examination suggest that his cough is most likely multifactorial with contribution from PND, vaping, post infectious-cough, some component of GERD and question if lisinopril is also playing a role.   Start dymista 1 spray twice a day. This replaces Flonase.  Nasal saline spray (i.e., Simply Saline) or nasal saline lavage (i.e., NeilMed) is recommended as needed and prior to medicated nasal sprays.  May use over the counter antihistamines such as Zyrtec (cetirizine), Claritin (loratadine), Allegra (fexofenadine), or Xyzal (levocetirizine) daily as needed.  Get CXR.  Stop vaping.  May use albuterol rescue inhaler 2 puffs or nebulizer every 4 to 6 hours as needed for shortness of breath, chest tightness, coughing, and wheezing. May use albuterol rescue inhaler 2 puffs 5 to 15 minutes prior to strenuous physical activities. Monitor frequency of use.  Stop lisinopril and call your PCP about changing your medications.   Call in 2  weeks if not better and will start PPI then.

## 2018-09-07 NOTE — Progress Notes (Signed)
New Patient Note  RE: Steven Contreras MRN: [id number] DOB: [birth year] Date of Office Visit: 09/07/2018  Referring provider: Larkin Ina Primary care provider: Morrell Riddle, PA-C  Chief Complaint: Cough; Nasal Congestion; Burning Eyes; and Sinusitis  History of Present Illness: I had the pleasure of seeing Steven Contreras for initial evaluation at the [facility] on 09/07/2018. He is a 63 y.o. male, who is referred here by [name], [name], PA-C for the evaluation of chronic cough and sinus congestion. He is accompanied today by his wife who provided/contributed to the history.     Patient states that in December he developed cold like symptoms that did not resolve despite 2 courses of antibiotics (doxycycline and levofloxacin) and steroids.  Today he states his current symptoms are dry cough and post nasal drip and has had these symptoms for the past 5 months.  He has associated symptoms of diaphoresis with coughing spells. He has tried intermittent Claritin, mucinex, and nasal sprays.  He reports most benefit with Flonase.  The cough can happen at any time of the day and not related at any specific trigger. Patient takes lisinopril daily for the past 3 years.  He denies fever/chills, sore throat, gerd, nausea/vomiting, or weight loss.  No previous allergy testing, ENT evaluation or EGD. No recent CXR. No one else in the household has this cough. No history of asthma or other respiratory issues. Patient does vape on a daily basis.  Assessment and Plan: Steven Contreras is a 63 y.o. male with: Chronic cough Mostly non-productive persistent cough for the past 5 months. Initially thought to be infectious but 2 courses of antibiotics and prednisone later the cough is still not improved. No triggers noted. No prior history of respiratory issues or GERD. Flonase seemed to help the most. Antihistamines and mucinex not effective. No recent CXR.  Today's skin testing showed: Mildly  positive to grass and mold.   Today's spirometry was normal with no improvement in FEV1 post bronchodilator treatment.  The most common causes of chronic cough include the following: upper airway cough syndrome (UACS) which is caused by variety of rhinitis conditions; asthma; gastroesophageal reflux disease (GERD); chronic bronchitis from cigarette smoking or other inhaled environmental irritants; non-asthmatic eosinophilic bronchitis; and bronchiectasis.  In prospective studies, these conditions have accounted for up to 94% of the causes of chronic cough in immunocompetent adults.  The history and physical examination suggest that his cough is most likely multifactorial with contribution from PND, vaping, post infectious-cough, some component of GERD and question if lisinopril is also playing a role.   Start dymista 1 spray twice a day. This replaces Flonase.  Nasal saline spray (i.e., Simply Saline) or nasal saline lavage (i.e., NeilMed) is recommended as needed and prior to medicated nasal sprays.  May use over the counter antihistamines such as Zyrtec (cetirizine), Claritin (loratadine), Allegra (fexofenadine), or Xyzal (levocetirizine) daily as needed.  Get CXR.  Stop vaping.  May use albuterol rescue inhaler 2 puffs or nebulizer every 4 to 6 hours as needed for shortness of breath, chest tightness, coughing, and wheezing. May use albuterol rescue inhaler 2 puffs 5 to 15 minutes prior to strenuous physical activities. Monitor frequency of use.  Stop lisinopril and call your PCP about changing your medications.   Call in 2 weeks if not better and will start PPI then.   Other allergic rhinitis  Today's skin testing showed: Mildly positive to grass and mold.   Discussed environmental  control measures and not sure how much of these allergens are contributing to his cough.   Start dymista 1 spray twice a day. This replaces Flonase.  Nasal saline spray (i.e., Simply Saline) or nasal  saline lavage (i.e., NeilMed) is recommended as needed and prior to medicated nasal sprays.  May use over the counter antihistamines such as Zyrtec (cetirizine), Claritin (loratadine), Allegra (fexofenadine), or Xyzal (levocetirizine) daily as needed.  Return in about 4 weeks (around 10/05/2018).  Meds ordered this encounter  Medications  . Azelastine-Fluticasone (DYMISTA) 137-50 MCG/ACT SUSP    Sig: Place 1 spray into both nostrils 2 (two) times a day.    Dispense:  1 Bottle    Refill:  5  . albuterol (VENTOLIN HFA) 108 (90 Base) MCG/ACT inhaler    Sig: Inhale 2 puffs into the lungs every 6 (six) hours as needed for wheezing or shortness of breath.    Dispense:  1 Inhaler    Refill:  1   Other allergy screening: Asthma: no Rhino conjunctivitis: yes Food allergy: no Medication allergy: yes ampicillin - stomach cramps Hymenoptera allergy: no Urticaria: no Eczema:no History of recurrent infections suggestive of immunodeficency: no  Diagnostics: Spirometry:  Tracings reviewed. His effort: Good reproducible efforts. FVC: 3.58 L FEV1: 3.08L, 99% predicted FEV1/FVC ratio: 0.86% Interpretation: Spirometry consistent with normal pattern with no improvement in FEV1 post bronchodilator treatment.  Please see scanned spirometry results for details.  Skin Testing: Environmental allergy panel and basic foods.  Positive test to: mild to grass and mold. Results discussed with patient/family. Airborne Adult Perc - 09/07/18 0955    Time Antigen Placed  0955    Allergen Manufacturer  Waynette Buttery    Location  Back    Number of Test  59    Panel 1  Select    1. Control-Buffer 50% Glycerol  Negative    2. Control-Histamine 1 mg/ml  2+    3. Albumin saline  Negative    4. Bahia  Negative    5. French Southern Territories  2+    6. Johnson  Negative    7. Kentucky Blue  Negative    8. Meadow Fescue  Negative    9. Perennial Rye  Negative    10. Sweet Vernal  Negative    11. Timothy  Negative    12. Cocklebur   Negative    13. Burweed Marshelder  Negative    14. Ragweed, short  Negative    15. Ragweed, Giant  Negative    16. Plantain,  English  Negative    17. Lamb's Quarters  Negative    18. Sheep Sorrell  Negative    19. Rough Pigweed  Negative    20. Marsh Elder, Rough  Negative    21. Mugwort, Common  Negative    22. Ash mix  Negative    23. Birch mix  Negative    24. Beech American  Negative    25. Box, Elder  Negative    26. Cedar, red  Negative    27. Cottonwood, Guinea-Bissau  Negative    28. Elm mix  Negative    29. Hickory mix  Negative    30. Maple mix  Negative    31. Oak, Guinea-Bissau mix  Negative    32. Pecan Pollen  Negative    33. Pine mix  Negative    34. Sycamore Eastern  Negative    35. Walnut, Black Pollen  Negative    36. Alternaria alternata  Negative  37. Cladosporium Herbarum  Negative    38. Aspergillus mix  Negative    39. Penicillium mix  Negative    40. Bipolaris sorokiniana (Helminthosporium)  Negative    41. Drechslera spicifera (Curvularia)  Negative    42. Mucor plumbeus  Negative    43. Fusarium moniliforme  Negative    44. Aureobasidium pullulans (pullulara)  Negative    45. Rhizopus oryzae  Negative    46. Botrytis cinera  Negative    47. Epicoccum nigrum  Negative    48. Phoma betae  Negative    49. Candida Albicans  Negative    50. Trichophyton mentagrophytes  Negative    51. Mite, D Farinae  5,000 AU/ml  Negative    52. Mite, D Pteronyssinus  5,000 AU/ml  Negative    53. Cat Hair 10,000 BAU/ml  Negative    54.  Dog Epithelia  Negative    55. Mixed Feathers  Negative    56. Horse Epithelia  Negative    57. Cockroach, German  Negative    58. Mouse  Negative    59. Tobacco Leaf  Negative     Intradermal - 09/07/18 1020    Time Antigen Placed  1020    Allergen Manufacturer  Greer    Location  Arm    Number of Test  15    Intradermal  Select    Control  Negative    French Southern TerritoriesBermuda  Omitted    Johnson  Negative    7 Grass  Negative    Ragweed mix   Negative    Weed mix  Negative    Tree mix  Negative    Mold 1  2+    Mold 2  2+    Mold 3  Negative    Mold 4  Negative    Cat  Negative    Dog  Negative    Cockroach  Negative    Mite mix  Negative     Food Adult Perc - 09/07/18 0956    Time Antigen Placed  [id number]    Allergen Manufacturer  Waynette ButteryGreer    Location  Back    Number of allergen test  10    1. Peanut  Negative    2. Soybean  Negative    3. Wheat  Negative    4. Sesame  Negative    5. Milk, cow  Negative    6. Egg White, Chicken  Negative    7. Casein  Negative    8. Shellfish Mix  Negative    9. Fish Mix  Negative    10. Cashew  Negative       Past Medical History: Patient Active Problem List   Diagnosis Date Noted  . Chronic cough 09/07/2018  . Other allergic rhinitis 09/07/2018  . Somatic dysfunction of right sacroiliac joint 05/01/2018  . Arthritis of right sacroiliac joint 05/01/2018  . Nonallopathic lesion of thoracic region 05/01/2018  . Nonallopathic lesion of lumbosacral region 05/01/2018  . Nonallopathic lesion of rib cage 05/01/2018  . Nonallopathic lesion of cervical region 05/01/2018  . Chronic back pain 12/20/2017  . Pain in hip region after total hip replacement (HCC) 12/20/2017  . S/P right THA, AA 10/08/2014   Past Medical History:  Diagnosis Date  . ADD (attention deficit disorder)   . Anxiety    hx of  . Arthritis   . Eczema   . Family history of adverse reaction to anesthesia    mother slow  to awaken  . Headache   . Hypertension   . Right rotator cuff tear    Past Surgical History: Past Surgical History:  Procedure Laterality Date  . colonscopy  2014  . SHOULDER SURGERY Left years ago   rotator, bicep and clavicle repair.  [name] TOTAL HIP ARTHROPLASTY Right 10/08/2014   Procedure: RIGHT TOTAL HIP ARTHROPLASTY ANTERIOR APPROACH;  Surgeon: Durene Romans, MD;  Location: [facility];  Service: Orthopedics;  Laterality: Right;   Medication List:  Current Outpatient Medications  Medication  Sig Dispense Refill  . cholecalciferol (VITAMIN D) 1000 units tablet Take 1,000 Units by mouth daily.    . citalopram (CELEXA) 20 MG tablet Take 20 mg by mouth at bedtime.    [name] lisinopril (PRINIVIL,ZESTRIL) 10 MG tablet Take 10 mg by mouth at bedtime.    . meloxicam (MOBIC) 15 MG tablet Take 1 tablet (15 mg total) by mouth daily. 90 tablet 0  . Misc Natural Products (TART CHERRY ADVANCED PO) Take by mouth.    . Omega-3 Fatty Acids (FISH OIL PO) Take 3,600 mg by mouth every evening.    . polyvinyl alcohol (LIQUIFILM TEARS) 1.4 % ophthalmic solution Place 1 drop into both eyes 4 (four) times daily as needed for dry eyes.    . Potassium 99 MG TABS Take 1 tablet by mouth every evening.    . Testosterone 30 MG MISC Place 50 mg inside cheek.    [name] albuterol (VENTOLIN HFA) 108 (90 Base) MCG/ACT inhaler Inhale 2 puffs into the lungs every 6 (six) hours as needed for wheezing or shortness of breath. 1 Inhaler 1  . Azelastine-Fluticasone (DYMISTA) 137-50 MCG/ACT SUSP Place 1 spray into both nostrils 2 (two) times a day. 1 Bottle 5  . lisdexamfetamine (VYVANSE) 30 MG capsule Take 30 mg by mouth every morning.    . Menthol, Topical Analgesic, (ZIMS MAX-FREEZE EX) Apply 1 application topically 2 (two) times daily as needed (hip and shoulder.).    [name] vitamin C (ASCORBIC ACID) 500 MG tablet Take 500 mg by mouth every morning.     No current facility-administered medications for this visit.    Allergies: Allergies  Allergen Reactions  . Ampicillin     NOT sure if its ampicillin or amoxicillin---stomach cramps   Social History: Social History   Socioeconomic History  . Marital status: Married    Spouse name: Not on file  . Number of children: Not on file  . Years of education: Not on file  . Highest education level: Not on file  Occupational History  . Not on file  Social Needs  . Financial resource strain: Not on file  . Food insecurity:    Worry: Not on file    Inability: Not on file  .  Transportation needs:    Medical: Not on file    Non-medical: Not on file  Tobacco Use  . Smoking status: Former Smoker    Packs/day: 0.50    Years: 15.00    Pack years: 7.50    Types: Cigarettes    Last attempt to quit: 05/03/2004    Years since quitting: 14.3  . Smokeless tobacco: Never Used  Substance and Sexual Activity  . Alcohol use: Yes    Comment: 2 shots burbon per day or beer  . Drug use: No  . Sexual activity: Not on file  Lifestyle  . Physical activity:    Days per week: Not on file    Minutes per session: Not on file  .  Stress: Not on file  Relationships  . Social connections:    Talks on phone: Not on file    Gets together: Not on file    Attends religious service: Not on file    Active member of club or organization: Not on file    Attends meetings of clubs or organizations: Not on file    Relationship status: Not on file  Other Topics Concern  . Not on file  Social History Narrative  . Not on file   Lives in a house with wife Smoking: Currently vapes daily.  Has a history of intermittent cigarette use for 40 years. Last cigarette 15 years ago. Occupation: currently unemployed.  Previously a Runner, broadcasting/film/video also worked in [employer] History: Immunologist in the house: no Engineer, civil (consulting) in the family room: no Carpet in the bedroom: yes Heating: gas Cooling: central Pet: yes 2 dogs x few years.   Family History: Family History  Problem Relation Age of Onset  . Allergic rhinitis Sister    Problem                               Relation Asthma                                   N/A Eczema                                Dad Food allergy                          N/A Allergic rhino conjunctivitis     N/A  Review of Systems  Constitutional: Negative for appetite change, chills, fever and unexpected weight change.  HENT: Positive for congestion. Negative for rhinorrhea.   Eyes: Negative for itching.  Respiratory: Positive for cough.  Negative for chest tightness, shortness of breath and wheezing.   Cardiovascular: Negative for chest pain.  Gastrointestinal: Negative for abdominal pain.  Genitourinary: Negative for difficulty urinating.  Skin: Negative for rash.  Allergic/Immunologic: Positive for environmental allergies. Negative for food allergies.  Neurological: Negative for headaches.   Objective: BP (!) 144/88 (BP Location: Left Arm, Patient Position: Sitting, Cuff Size: Large)   Pulse 76   Temp 97.9 F (36.6 C) (Oral)   Resp 20   Ht  (1.702 m)   Wt 265 lb 6.4 oz (120.4 kg)   SpO2 94%   BMI 41.57 kg/m  Body mass index is 41.57 kg/m. Physical Exam  Constitutional: He is oriented to person, place, and time. He appears well-developed and well-nourished.  Obese   HENT:  Right Ear: External ear normal.  Left Ear: External ear normal.  Mouth/Throat: Oropharynx is clear and moist. No oropharyngeal exudate.  Eyes: Conjunctivae are normal. Right eye exhibits no discharge. Left eye exhibits no discharge.  Neck: Neck supple.  Cardiovascular: Normal rate, regular rhythm and normal heart sounds. Exam reveals no gallop and no friction rub.  No murmur heard. Pulmonary/Chest: Effort normal and breath sounds normal. No respiratory distress. He has no wheezes. He has no rales.  Abdominal: Soft.  Neurological: He is alert and oriented to person, place, and time.  Skin: Skin is warm. No rash noted. He is diaphoretic (to touch on the back).  Psychiatric: He has a normal mood and affect. His behavior is normal.  Nursing note and vitals reviewed.  The plan was reviewed with the patient/family, and all questions/concerned were addressed.  It was my pleasure to see Steven Contreras today and participate in his care. Please feel free to contact me with any questions or concerns.  Sincerely,  Wyline Mood, DO Allergy & Immunology  [facility] office: [phone] [facility] office:  [phone]  Patient was seen and evaluated together with resident Geralyn Corwin, DO. Physical exam, assessment and plan was formulated together with the resident.

## 2018-09-07 NOTE — Assessment & Plan Note (Signed)
   Today's skin testing showed: Mildly positive to grass and mold.   Discussed environmental control measures and not sure how much of these allergens are contributing to his cough.   Start dymista 1 spray twice a day. This replaces Flonase.  Nasal saline spray (i.e., Simply Saline) or nasal saline lavage (i.e., NeilMed) is recommended as needed and prior to medicated nasal sprays.  May use over the counter antihistamines such as Zyrtec (cetirizine), Claritin (loratadine), Allegra (fexofenadine), or Xyzal (levocetirizine) daily as needed.

## 2018-09-08 ENCOUNTER — Other Ambulatory Visit: Payer: Self-pay | Admitting: Family Medicine

## 2018-09-11 ENCOUNTER — Ambulatory Visit
Admission: RE | Admit: 2018-09-11 | Discharge: 2018-09-11 | Disposition: A | Discharge status: Home / Self Care | Payer: Commercial Managed Care - PPO | Source: Ambulatory Visit | Attending: Allergy | Admitting: Allergy

## 2018-09-11 ENCOUNTER — Other Ambulatory Visit: Payer: Self-pay

## 2018-09-11 ENCOUNTER — Encounter: Payer: Self-pay | Admitting: Allergy

## 2018-09-11 NOTE — Telephone Encounter (Signed)
Left message for patient to call back to schedule virtual visit.  

## 2018-10-05 ENCOUNTER — Ambulatory Visit: Payer: Commercial Managed Care - PPO | Admitting: Allergy

## 2019-03-25 ENCOUNTER — Other Ambulatory Visit: Payer: Self-pay | Admitting: Family Medicine

## 2019-03-26 NOTE — Telephone Encounter (Signed)
Left message for patient to schedule virtual visit as he has not been seen for 1 year.

## 2019-09-11 ENCOUNTER — Encounter: Payer: Self-pay | Admitting: Neurology

## 2019-09-11 ENCOUNTER — Ambulatory Visit (INDEPENDENT_AMBULATORY_CARE_PROVIDER_SITE_OTHER): Payer: Commercial Managed Care - PPO | Admitting: Neurology

## 2019-09-11 ENCOUNTER — Other Ambulatory Visit: Payer: Self-pay

## 2019-09-11 VITALS — BP 142/86 | HR 85 | Temp 96.9°F | Ht 67.0 in | Wt 261.0 lb

## 2019-09-11 DIAGNOSIS — R0681 Apnea, not elsewhere classified: Secondary | ICD-10-CM | POA: Diagnosis not present

## 2019-09-11 DIAGNOSIS — R351 Nocturia: Secondary | ICD-10-CM

## 2019-09-11 DIAGNOSIS — G4719 Other hypersomnia: Secondary | ICD-10-CM

## 2019-09-11 DIAGNOSIS — G2581 Restless legs syndrome: Secondary | ICD-10-CM

## 2019-09-11 NOTE — Patient Instructions (Signed)

## 2019-09-11 NOTE — Progress Notes (Signed)
Subjective:    Patient ID: [id number] STEMM is a 64 y.o. male.  HPI    Huston Foley, MD, PhD [facility] [address], [address] [address] [place], [place] [address]  Dear Dr. Talmage Nap,   I saw your patient, Yeshua Stryker, upon your kind request, in my Sleep clinic today for initial consultation of his sleep disorder, in particular, concern for underlying obstructive sleep apnea.  The patient is unaccompanied today.  As you know, Mr. Craine is a 64 year old right-handed gentleman with an underlying medical history of type 2 diabetes, sinus problems, hyperlipidemia, hypertension arthritis with status post right hip replacement and shoulder surgeries, and obesity with a BMI of over 40, who reports snoring and excessive daytime somnolence, as well as witnessed apneas, per wife's report.  I reviewed your office note from 08/27/2019.  His Epworth sleepiness score is 17 out of 24 today, fatigue severity score is 58 out of 63.  He has gained weight over time.  His snoring has become worse over time.  He is not aware of any family history of OSA.  He often does not sleep in the same bedroom with his wife because of his loud snoring.  He is restless.  He ends up sleeping on the couch often.  He reports no recurrent morning headaches, has a variable bedtime and rise time.  He is a retired Programmer, systems.  He quit smoking in 2008 and drinks alcohol very occasionally or rarely.  He does have the TV on in the family room more than and turns down the volume.  They have 2 dogs in the household.  He has nocturia several times a night, 3-5 on average.  He does not sleep well through the night.  He has restless leg symptoms.  He lives with his family including wife and 2 children, ages 65 and 18.   Of note, he was recently started on amlodipine less than 3 weeks ago and in the past several days he has noted swelling around his ankles.  His Past Medical History Is Significant For: Past Medical History:   Diagnosis Date  . ADD (attention deficit disorder)   . Anxiety    hx of  . Arthritis   . Diabetes (HCC)   . Eczema   . Family history of adverse reaction to anesthesia    mother slow to awaken  . Headache   . High cholesterol   . Hypertension   . Right rotator cuff tear     His Past Surgical History Is Significant For: Past Surgical History:  Procedure Laterality Date  . colonscopy  2014  . SHOULDER SURGERY Left years ago   rotator, bicep and clavicle repair.  [name] TOTAL HIP ARTHROPLASTY Right 10/08/2014   Procedure: RIGHT TOTAL HIP ARTHROPLASTY ANTERIOR APPROACH;  Surgeon: Durene Romans, MD;  Location: [facility];  Service: Orthopedics;  Laterality: Right;    His Family History Is Significant For: Family History  Problem Relation Age of Onset  . Pancreatic cancer Mother   . Prostate cancer Father   . Allergic rhinitis Sister     His Social History Is Significant For: Social History   Socioeconomic History  . Marital status: Married    Spouse name: Not on file  . Number of children: Not on file  . Years of education: Not on file  . Highest education level: Not on file  Occupational History  . Not on file  Tobacco Use  . Smoking status: Former Smoker  Packs/day: 0.50    Years: 15.00    Pack years: 7.50    Types: Cigarettes    Quit date: 05/03/2006    Years since quitting: 13.3  . Smokeless tobacco: Never Used  Substance and Sexual Activity  . Alcohol use: Yes    Comment: rarely  . Drug use: Never  . Sexual activity: Not on file  Other Topics Concern  . Not on file  Social History Narrative  . Not on file   Social Determinants of Health   Financial Resource Strain:   . Difficulty of Paying Living Expenses:   Food Insecurity:   . Worried About Programme researcher, broadcasting/film/video in the Last Year:   . Barista in the Last Year:   Transportation Needs:   . Freight forwarder (Medical):   [name] Lack of Transportation (Non-Medical):   Physical Activity:   . Days of  Exercise per Week:   . Minutes of Exercise per Session:   Stress:   . Feeling of Stress :   Social Connections:   . Frequency of Communication with Friends and Family:   . Frequency of Social Gatherings with Friends and Family:   . Attends Religious Services:   . Active Member of Clubs or Organizations:   . Attends Banker Meetings:   [name] Marital Status:     His Allergies Are:  Allergies  Allergen Reactions  . Ampicillin     NOT sure if its ampicillin or amoxicillin---stomach cramps  :   His Current Medications Are:  Outpatient Encounter Medications as of 09/11/2019  Medication Sig  . amLODipine (NORVASC) 10 MG tablet Take 10 mg by mouth daily.  . cholecalciferol (VITAMIN D) 1000 units tablet Take 1,000 Units by mouth daily.  . citalopram (CELEXA) 40 MG tablet Take 40 mg by mouth daily.  . Insulin Glargine (LANTUS SOLOSTAR [place]) Inject into the skin. 20 units daily  . insulin lispro (HUMALOG) 100 UNIT/ML injection Inject into the skin 3 (three) times daily before meals. 10 units 3 times  . lisinopril (PRINIVIL,ZESTRIL) 10 MG tablet Take 10 mg by mouth at bedtime.  . meloxicam (MOBIC) 15 MG tablet TAKE 1 TABLET BY MOUTH  DAILY  . metFORMIN (GLUCOPHAGE) 500 MG tablet Take by mouth.  . Misc Natural Products (TART CHERRY ADVANCED PO) Take by mouth.  . Omega-3 Fatty Acids (FISH OIL PO) Take 3,600 mg by mouth every evening.  . polyvinyl alcohol (LIQUIFILM TEARS) 1.4 % ophthalmic solution Place 1 drop into both eyes 4 (four) times daily as needed for dry eyes.  . Potassium 99 MG TABS Take 1 tablet by mouth every evening.  . Testosterone 30 MG MISC Place 50 mg inside cheek.  . [DISCONTINUED] albuterol (VENTOLIN HFA) 108 (90 Base) MCG/ACT inhaler Inhale 2 puffs into the lungs every 6 (six) hours as needed for wheezing or shortness of breath.  . [DISCONTINUED] Azelastine-Fluticasone (DYMISTA) 137-50 MCG/ACT SUSP Place 1 spray into both nostrils 2 (two) times a day.  .  [DISCONTINUED] citalopram (CELEXA) 20 MG tablet Take 20 mg by mouth at bedtime.  . [DISCONTINUED] lisdexamfetamine (VYVANSE) 30 MG capsule Take 30 mg by mouth every morning.  . [DISCONTINUED] Menthol, Topical Analgesic, (ZIMS MAX-FREEZE EX) Apply 1 application topically 2 (two) times daily as needed (hip and shoulder.).  . [DISCONTINUED] vitamin C (ASCORBIC ACID) 500 MG tablet Take 500 mg by mouth every morning.   No facility-administered encounter medications on file as of 09/11/2019.  :  Review of  Systems:  Out of a complete 14 point review of systems, all are reviewed and negative with the exception of these symptoms as listed below: Review of Systems  Neurological:       Here for sleep consult. No prior sleep study, reports snoring and daytime sleepiness/fatigue are present.   Epworth Sleepiness Scale 0= would never doze 1= slight chance of dozing 2= moderate chance of dozing 3= high chance of dozing  Sitting and reading3 Watching TV:3 Sitting inactive in a public place (ex. Theater or meeting):2 As a passenger in a car for an hour without a break:3 Lying down to rest in the afternoon:3 Sitting and talking to someone:1 Sitting quietly after lunch (no alcohol):2 In a car, while stopped in traffic:0 Total:17     Objective:  Neurological Exam  Physical Exam Physical Examination:   Vitals:   09/11/19 0850  BP: (!) 142/86  Pulse: 85  Temp: (!) 96.9 F (36.1 C)    General Examination: The patient is a very pleasant 64 y.o. male in no acute distress. He appears well-developed and well groomed.   HEENT: Normocephalic, atraumatic, pupils are equal, round and reactive to light, extraocular tracking is good without limitation to gaze excursion or nystagmus noted. Hearing is grossly intact. Face is symmetric with normal facial animation. Speech is clear with no dysarthria noted. There is no hypophonia. There is no lip, neck/head, jaw or voice tremor. Neck is supple with full  range of passive and active motion. There are no carotid bruits on auscultation. Oropharynx exam reveals: mild mouth dryness, adequate dental hygiene and moderate airway crowding, due to wider tongue, Mallampati class II, tonsils appear to be small, right side easier to see than left, both sides small.  Prominent uvula noted.  Tongue protrudes centrally in palate elevates symmetrically, neck circumference 19-5/8 inches.  He has a minimal overbite.  Chest: Clear to auscultation without wheezing, rhonchi or crackles noted.  Heart: S1+S2+0, regular and normal without murmurs, rubs or gallops noted.   Abdomen: Soft, non-tender and non-distended with normal bowel sounds appreciated on auscultation.  Extremities: There is trace pitting edema in the distal lower extremities bilaterally.   Skin: Warm and dry without trophic changes noted.   Musculoskeletal: exam reveals no obvious joint deformities, tenderness or joint swelling or erythema.   Neurologically:  Mental status: The patient is awake, alert and oriented in all 4 spheres. His immediate and remote memory, attention, language skills and fund of knowledge are appropriate. There is no evidence of aphasia, agnosia, apraxia or anomia. Speech is clear with normal prosody and enunciation. Thought process is linear. Mood is normal and affect is normal.  Cranial nerves II - XII are as described above under HEENT exam.  Motor exam: Normal bulk, strength and tone is noted. There is no tremor, Romberg is negative. Reflexes are 2+ throughout. Fine motor skills and coordination: grossly intact.  Cerebellar testing: No dysmetria or intention tremor. There is no truncal or gait ataxia.  Sensory exam: intact to light touch in the upper and lower extremities.  Gait, station and balance: He stands easily. No veering to one side is noted. No leaning to one side is noted. Posture is age-appropriate and stance is narrow based. Gait shows normal stride length and  normal pace. No problems turning are noted. Tandem walk is unremarkable.                Assessment and Plan:  In summary, MIHAIL PRETTYMAN is a very pleasant 64  y.o.-year old male with an underlying medical history of type 2 diabetes, sinus problems, hyperlipidemia, hypertension arthritis with status post right hip replacement and shoulder surgeries, and obesity with a BMI of over 40, whose history and physical exam are concerning for obstructive sleep apnea (OSA). I had a long chat with the patient about my findings and the diagnosis of OSA, its prognosis and treatment options. We talked about medical treatments, surgical interventions and non-pharmacological approaches. I explained in particular the risks and ramifications of untreated moderate to severe OSA, especially with respect to developing cardiovascular disease down the Road, including congestive heart failure, difficult to treat hypertension, cardiac arrhythmias, or stroke. Even type 2 diabetes has, in part, been linked to untreated OSA. Symptoms of untreated OSA include daytime sleepiness, memory problems, mood irritability and mood disorder such as depression and anxiety, lack of energy, as well as recurrent headaches, especially morning headaches. We talked about trying to maintain a healthy lifestyle in general, as well as the importance of weight control. We also talked about the importance of good sleep hygiene. I recommended the following at this time: sleep study.  I explained the sleep test procedure to the patient and also outlined possible surgical and non-surgical treatment options of OSA, including the use of a custom-made dental device (which would require a referral to a specialist dentist or oral surgeon), upper airway surgical options, such as traditional UPPP or a novel less invasive surgical option in the form of Inspire hypoglossal nerve stimulation (which would involve a referral to an ENT surgeon). I also explained the CPAP  treatment option to the patient, who indicated that he would be willing to try CPAP if the need arises. I explained the importance of being compliant with PAP treatment, not only for insurance purposes but primarily to improve His symptoms, and for the patient's long term health benefit, including to reduce His cardiovascular risks. I answered all his questions today and the patient was in agreement. I plan to see him back after the sleep study is completed and encouraged him to call with any interim questions, concerns, problems or updates.   Thank you very much for allowing me to participate in the care of this nice patient. If I can be of any further assistance to you please do not hesitate to call me at [phone].  Sincerely,   Star Age, MD, PhD

## 2019-09-18 ENCOUNTER — Telehealth: Payer: Self-pay

## 2019-09-18 NOTE — Telephone Encounter (Signed)
LVM for pt to call me back to schedule sleep study  

## 2019-09-25 ENCOUNTER — Telehealth: Payer: Self-pay

## 2019-09-25 NOTE — Telephone Encounter (Signed)
LVM for pt to call me back to schedule sleep study  

## 2019-11-07 ENCOUNTER — Other Ambulatory Visit: Payer: Self-pay

## 2019-11-07 ENCOUNTER — Ambulatory Visit (INDEPENDENT_AMBULATORY_CARE_PROVIDER_SITE_OTHER): Payer: Commercial Managed Care - PPO | Admitting: Neurology

## 2019-11-07 DIAGNOSIS — G4733 Obstructive sleep apnea (adult) (pediatric): Secondary | ICD-10-CM

## 2019-11-07 DIAGNOSIS — R0681 Apnea, not elsewhere classified: Secondary | ICD-10-CM

## 2019-11-07 DIAGNOSIS — R351 Nocturia: Secondary | ICD-10-CM

## 2019-11-07 DIAGNOSIS — G4719 Other hypersomnia: Secondary | ICD-10-CM

## 2019-11-07 DIAGNOSIS — G2581 Restless legs syndrome: Secondary | ICD-10-CM

## 2019-11-19 ENCOUNTER — Telehealth: Payer: Self-pay

## 2019-11-19 NOTE — Progress Notes (Signed)
Patient referred by Dr. Talmage Nap, seen by me on 09/11/19, HST on 11/08/19.    Please call and notify the patient that the recent home sleep test showed obstructive sleep apnea in the moderate range. I recommend treatment for this in the form of autoPAP, which means, that we don't have to bring him in for a sleep study with CPAP, but will let him try an autoPAP machine at home, through a DME company (of his choice, or as per insurance requirement). The DME representative will educate him on how to use the machine, how to put the mask on, etc. I have placed an order in the chart. Please send referral, talk to patient, send report to referring MD. We will need a FU in sleep clinic for 10 weeks post-PAP set up, please arrange that with me or one of our NPs. Thanks,   Huston Foley, MD, PhD [facility])

## 2019-11-19 NOTE — Telephone Encounter (Signed)
I called pt. No answer, left a message asking pt to call me back.   

## 2019-11-19 NOTE — Procedures (Signed)
Patient Information     First Name: Steven Last Name: Contreras ID: [id number]  Birth Date: 05-Feb-2056 Age: 64 Gender: Male  Referring Provider: [name], MD BMI: 40.8 (W=260 lb, H=5' 7'')  Neck Circ.:  20 '' Epworth:  17/24   Sleep Study Information    Study Date: 11/08/19 S/H/A Version: [id number] / 4.1.1528 / 72  History:    64 year old man with a history of type 2 diabetes, sinus problems, hyperlipidemia, hypertension arthritis with status post right hip replacement and shoulder surgeries, and obesity with a BMI of over 40, who reports snoring and excessive daytime somnolence, as well as witnessed apneas, per wife's report.   Summary & Diagnosis:     OSA Recommendations:     This home sleep test demonstrates moderate obstructive sleep apnea with a total AHI of 20/hour and O2 nadir of 81%. Treatment with positive airway pressure is recommended. The patient will be advised to proceed with an autoPAP titration/trial at home for now. Alternative treatments may include an oral appliance or surgical options; weight loss is recommended. Please note that untreated obstructive sleep apnea may carry additional perioperative morbidity. Patients with significant obstructive sleep apnea should receive perioperative PAP therapy and the surgeons and particularly the anesthesiologist should be informed of the diagnosis and the severity of the sleep disordered breathing. The patient should be cautioned not to drive, work at heights, or operate dangerous or heavy equipment when tired or sleepy. Review and reiteration of good sleep hygiene measures should be pursued with any patient. Other causes of the patient's symptoms, including circadian rhythm disturbances, an underlying mood disorder, medication effect and/or an underlying medical problem cannot be ruled out based on this test. Clinical correlation is recommended. The patient and his referring provider will be notified of the test results. The patient will be  seen in follow up in sleep clinic at [facility].  I certify that I have reviewed the raw data recording prior to the issuance of this report in accordance with the standards of the American Academy of Sleep Medicine (AASM).  Huston Foley, MD, PhD [facility]) Diplomat, ABPN (Neurology and Sleep)               Sleep Summary    Oxygen Saturation Statistics     Start Study Time: End Study Time: Total Recording Time:          12:04:23 AM 7:05:50 AM         7 h, 1 min  Total Sleep Time % REM of Sleep Time:  4 h, 44 min  17.7    Mean: 92 Minimum: 81 Maximum: 98  Mean of Desaturations Nadirs (%):   89  Oxygen Desaturation. %:   4-9 10-20 >20 Total  Events Number Total    50  3 94.3 5.7  0 0.0  53 100.0  Oxygen Saturation: <90 <=88 <85 <80 <70  Duration (minutes): Sleep % 10.8 3.8  6.1 0.8  2.1 0.3 0.0 0.0 0.0 0.0     Respiratory Indices      Total Events REM NREM All Night  pRDI:  122  pAHI:  94 ODI:  53  pAHIc:  0  % CSR: 0.0 49.1 49.1 39.5 0.0 20.9 13.7 5.2 0.0 25.9 20.0 11.3 0.0       Pulse Rate Statistics during Sleep (BPM)      Mean: 56 Minimum: 42 Maximum: 81    Indices are calculated using technically valid sleep time of 4 h, 42  min. pRDI/pAHI are calculated using oxi desaturations ? 3%  Body Position Statistics  Position Supine Prone Right Left Non-Supine  Sleep (min) 224.5 9.5 0.0 50.9 60.5  Sleep % 78.8 3.3 0.0 17.9 21.2  pRDI 25.9 N/A N/A 29.7 26.0  pAHI 18.4 N/A N/A 29.7 26.0  ODI 10.0 N/A N/A 17.9 16.0     Snoring Statistics Snoring Level (dB) >40 >50 >60 >70 >80 >Threshold (45)  Sleep (min) 57.9 3.7 0.9 0.0 0.0 8.6  Sleep % 20.3 1.3 0.3 0.0 0.0 3.0    Mean: 41 dB Sleep Stages Chart                               pAHI=20.0                                           Mild              Moderate                    Severe                                                 5              15                     30

## 2019-11-19 NOTE — Addendum Note (Signed)
Addended by: Huston Foley on: 11/19/2019 08:07 AM   Modules accepted: Orders

## 2019-11-19 NOTE — Telephone Encounter (Signed)
-----   Message from Huston Foley, MD sent at 11/19/2019  8:07 AM EDT ----- Patient referred by Dr. Talmage Nap, seen by me on 09/11/19, HST on 11/08/19.    Please call and notify the patient that the recent home sleep test showed obstructive sleep apnea in the moderate range. I recommend treatment for this in the form of autoPAP, which means, that we don't have to bring him in for a sleep study with CPAP, but will let him try an autoPAP machine at home, through a DME company (of his choice, or as per insurance requirement). The DME representative will educate him on how to use the machine, how to put the mask on, etc. I have placed an order in the chart. Please send referral, talk to patient, send report to referring MD. We will need a FU in sleep clinic for 10 weeks post-PAP set up, please arrange that with me or one of our NPs. Thanks,   Huston Foley, MD, PhD [facility])

## 2019-11-21 NOTE — Telephone Encounter (Signed)
I called pt. I advised pt that Dr. Frances Furbish reviewed their sleep study results and found that pt had moderate osa. Dr. Frances Furbish recommends that pt start autopap treatment at home. I reviewed PAP compliance expectations with the pt. Pt is agreeable to starting an auto-PAP. I advised pt that an order will be sent to a DME, [facility], and [facility] will call the pt within about one week after they file with the pt's insurance. [facility] will show the pt how to use the machine, fit for masks, and troubleshoot the auto-PAP if needed. A follow up appt was made for insurance purposes with Dr. Frances Furbish on 01/28/20 at 930 am. Pt verbalized understanding to arrive 15 minutes early and bring their auto-PAP. A letter with all of this information in it will be mailed to the pt as a reminder. I verified with the pt that the address we have on file is correct. Pt verbalized understanding of results. Pt had no questions at this time but was encouraged to call back if questions arise. I have sent the order to [facility] and have received confirmation that they have received the order.

## 2019-11-21 NOTE — Telephone Encounter (Signed)
I called pt. No answer, left a message asking pt to call me back.   

## 2020-01-24 ENCOUNTER — Telehealth: Payer: Self-pay

## 2020-01-24 NOTE — Telephone Encounter (Signed)
Pt is scheduled for his initial autopap appt on 01/28/2020. After looking at the data, pt will be one day shy of the 31-90 mark for appt to take place. I have called the pt and left a vm advising him we do not need for him to come to the office on Monday but would need to reschedule. I advised due to the heavy rain fall in our area we have been experiencing telephone difficulties and that he could my chart message me about rescheduling this appointment.If pt was to call in and get through he would need a f/u within 30-90 days. He may see the MD or NP and this visit could be face to face to virtual.

## 2020-01-28 ENCOUNTER — Ambulatory Visit: Payer: Self-pay | Admitting: Neurology

## 2020-02-16 DIAGNOSIS — Z136 Encounter for screening for cardiovascular disorders: Secondary | ICD-10-CM | POA: Insufficient documentation

## 2020-02-16 DIAGNOSIS — E782 Mixed hyperlipidemia: Secondary | ICD-10-CM | POA: Insufficient documentation

## 2020-02-16 NOTE — Progress Notes (Addendum)
Patient referred by Jacelyn Pi, MD for screening for CAD  Subjective:   Steven Contreras, male    DOB: July 09, 1955, 64 y.o.   MRN: [id number]   Chief Complaint  Patient presents with  . Hypertension  . New Patient (Initial Visit)     HPI  64 y.o. Caucasian male with hypertension, hyperlipidemia, type 2 DM  Patient is a retired [place], and Chief Financial Officer.  He is to be very active until couple years ago, when he started having issues with his right hip.  He is has continued to have right hip pain after undergoing hip replacement.  He has also had pyriformis syndrome.  This is limited his physical activity significantly.  He is not able to walk beyond 2/10 of a mile without having to stop.  Ever since decline in his physical activity, he has gained about 60 pounds of weight.  This is also been coupled with increase in his blood pressure, blood sugar, and cholesterol levels.  He denies any exertional chest pain.  However, he does report dyspnea on more than usual activity, such as working in his yard.  He is currently on amlodipine 10 mg, nebivolol 5 mg, Crestor 5 mg.  He has had severe cough with lisinopril in the past.   Past Medical History:  Diagnosis Date  . ADD (attention deficit disorder)   . Anxiety    hx of  . Arthritis   . Diabetes ([place])   . Eczema   . Family history of adverse reaction to anesthesia    mother slow to awaken  . Headache   . High cholesterol   . Hypertension   . Right rotator cuff tear      Past Surgical History:  Procedure Laterality Date  . colonscopy  2014  . SHOULDER SURGERY Left years ago   rotator, bicep and clavicle repair.  [name] TOTAL HIP ARTHROPLASTY Right 10/08/2014   Procedure: RIGHT TOTAL HIP ARTHROPLASTY ANTERIOR APPROACH;  Surgeon: Paralee Cancel, MD;  Location: [facility];  Service: Orthopedics;  Laterality: Right;     Social History   Tobacco Use  Smoking Status Former Smoker  . Packs/day: 0.50  . Years: 15.00  .  Pack years: 7.50  . Types: Cigarettes  . Quit date: 05/03/2006  . Years since quitting: 13.8  Smokeless Tobacco Never Used    Social History   Substance and Sexual Activity  Alcohol Use Yes   Comment: rarely     Family History  Problem Relation Age of Onset  . Pancreatic cancer Mother   . Prostate cancer Father   . Allergic rhinitis Sister      Current Outpatient Medications on File Prior to Visit  Medication Sig Dispense Refill  . amLODipine (NORVASC) 10 MG tablet Take 10 mg by mouth daily.    [name] aspirin EC 81 MG tablet Take 81 mg by mouth daily. Swallow whole.    . cholecalciferol (VITAMIN D) 1000 units tablet Take 1,000 Units by mouth daily.    . citalopram (CELEXA) 40 MG tablet Take 40 mg by mouth daily.    . furosemide (LASIX) 40 MG tablet Take 40 mg by mouth daily.    . Insulin Glargine (LANTUS SOLOSTAR [place]) Inject into the skin. 20 units daily    . insulin lispro (HUMALOG) 100 UNIT/ML injection Inject into the skin 3 (three) times daily before meals. 10 units 3 times    . metFORMIN (GLUCOPHAGE) 500 MG tablet Take by mouth.    [name]  nebivolol (BYSTOLIC) 5 MG tablet Take 1 tablet by mouth daily.    . Omega-3 Fatty Acids (FISH OIL PO) Take 3,600 mg by mouth every evening.    . polyvinyl alcohol (LIQUIFILM TEARS) 1.4 % ophthalmic solution Place 1 drop into both eyes 4 (four) times daily as needed for dry eyes.    . Potassium 99 MG TABS Take 1 tablet by mouth every evening.    . rosuvastatin (CRESTOR) 5 MG tablet Take 5 mg by mouth daily.     No current facility-administered medications on file prior to visit.    Cardiovascular and other pertinent studies:  EKG 02/22/2020: Sinus rhythm 67 bpm Left axis deviation Poor R wave progression   Recent labs: 01/28/2020: Glucose 147, BUN/Cr 15/12.8. EGFR 64.56. Na/K 141/4.2. Rest of the CMP normal HbA1C 6.5% Chol 235, TG 273, HDL 49, LDL 131   06/2018: Glucose N/A, BUN/Cr 8/0.81. EGFR normal. Na/K 138/4.6. ALT 47. Rest of  the CMP normal Chol 231, TG 335, HDL 57, LDL 107 TSH 2.0 normal    Review of Systems  Cardiovascular: Positive for dyspnea on exertion. Negative for chest pain, leg swelling, palpitations and syncope.  Musculoskeletal: Positive for joint pain.         Vitals:   02/22/20 1046  BP: (!) 154/91  Pulse: 69  SpO2: 94%     Body mass index is 39.84 kg/m. Filed Weights   02/22/20 1046  Weight: 262 lb (118.8 kg)     Objective:   Physical Exam Vitals and nursing note reviewed.  Constitutional:      General: He is not in acute distress. Neck:     Vascular: No JVD.  Cardiovascular:     Rate and Rhythm: Normal rate and regular rhythm.     Pulses:          Dorsalis pedis pulses are 2+ on the right side and 1+ on the left side.       Posterior tibial pulses are 1+ on the right side and 1+ on the left side.     Heart sounds: Normal heart sounds. No murmur heard.   Pulmonary:     Effort: Pulmonary effort is normal.     Breath sounds: Normal breath sounds. No wheezing or rales.         Assessment & Recommendations:   64 y.o. Caucasian male with hypertension, hyperlipidemia, type 2 DM  Exertional dyspnea: Likely multifactorial, including obesity, deconditioning, as well as angina equivalent. Physical activity limited due to orthopedic issues. Recommend walking/lexiscan nuclear stress test, calcium score scan. May start with Bruce protocol and switch to [place], if patient does not reach target heart rate.   Hyperlipidemia: LDL 131 on Crestor 5. Recommend increasing to 10 mg. Further dose change will depend on results of stress test and calcium score scan.   Hypertension: Uncontrolled. Given his diabetes, I have added losartan 50 mg. Check BMP in 1 week. In future, he may potentially be able to come off or reduce the dose of any of the other antihypertensives.  Hip pain: Do not suspect vascular claudication.  Thank you for referring the patient to [place]. Please feel free  to contact with any questions.   Nigel Mormon, MD Pager: [phone] Office: [phone]

## 2020-02-22 ENCOUNTER — Ambulatory Visit: Payer: Commercial Managed Care - PPO | Admitting: Cardiology

## 2020-02-22 ENCOUNTER — Encounter: Payer: Self-pay | Admitting: Cardiology

## 2020-02-22 ENCOUNTER — Other Ambulatory Visit: Payer: Self-pay

## 2020-02-22 VITALS — BP 154/91 | HR 69 | Ht 68.0 in | Wt 262.0 lb

## 2020-02-22 DIAGNOSIS — I10 Essential (primary) hypertension: Secondary | ICD-10-CM | POA: Insufficient documentation

## 2020-02-22 DIAGNOSIS — R0600 Dyspnea, unspecified: Secondary | ICD-10-CM | POA: Insufficient documentation

## 2020-02-22 DIAGNOSIS — Z136 Encounter for screening for cardiovascular disorders: Secondary | ICD-10-CM

## 2020-02-22 DIAGNOSIS — I119 Hypertensive heart disease without heart failure: Secondary | ICD-10-CM | POA: Insufficient documentation

## 2020-02-22 DIAGNOSIS — E782 Mixed hyperlipidemia: Secondary | ICD-10-CM

## 2020-02-22 DIAGNOSIS — R0609 Other forms of dyspnea: Secondary | ICD-10-CM

## 2020-02-22 MED ORDER — ROSUVASTATIN CALCIUM 10 MG PO TABS: 10.0000 mg | ORAL_TABLET | Freq: Every day | ORAL | 3 refills | Status: DC

## 2020-02-22 MED ORDER — LOSARTAN POTASSIUM 50 MG PO TABS: 50.0000 mg | ORAL_TABLET | Freq: Every day | ORAL | 3 refills | Status: DC

## 2020-02-26 ENCOUNTER — Other Ambulatory Visit: Payer: Self-pay

## 2020-02-26 ENCOUNTER — Other Ambulatory Visit: Payer: Medicare Other

## 2020-02-26 ENCOUNTER — Ambulatory Visit: Payer: Commercial Managed Care - PPO

## 2020-02-26 DIAGNOSIS — R0609 Other forms of dyspnea: Secondary | ICD-10-CM

## 2020-02-26 DIAGNOSIS — R0600 Dyspnea, unspecified: Secondary | ICD-10-CM

## 2020-02-26 DIAGNOSIS — I10 Essential (primary) hypertension: Secondary | ICD-10-CM

## 2020-02-29 ENCOUNTER — Other Ambulatory Visit (HOSPITAL_COMMUNITY): Payer: Self-pay | Admitting: Cardiology

## 2020-02-29 NOTE — Addendum Note (Signed)
Addended by: Elder Negus on: 02/29/2020 04:04 PM   Modules accepted: Orders

## 2020-03-01 LAB — BASIC METABOLIC PANEL
BUN/Creatinine Ratio: 12 (ref 10–24)
BUN: 12 mg/dL (ref 8–27)
CO2: 22 mmol/L (ref 20–29)
Calcium: 10.6 mg/dL — ABNORMAL HIGH (ref 8.6–10.2)
Chloride: 99 mmol/L (ref 96–106)
Creatinine, Ser: 1.03 mg/dL (ref 0.76–1.27)
GFR calc Af Amer: 88 mL/min/{1.73_m2} (ref >59)
GFR calc non Af Amer: 76 mL/min/{1.73_m2} (ref >59)
Glucose: 142 mg/dL — ABNORMAL HIGH (ref 65–99)
Potassium: 4.4 mmol/L (ref 3.5–5.2)
Sodium: 137 mmol/L (ref 134–144)

## 2020-03-03 ENCOUNTER — Telehealth: Payer: Self-pay

## 2020-03-03 NOTE — Telephone Encounter (Signed)
I called pt to update his chart prior to his mychart visit tomorrow. No answer, left a message asking him to call me back.

## 2020-03-04 ENCOUNTER — Encounter: Payer: Self-pay | Admitting: Neurology

## 2020-03-04 ENCOUNTER — Telehealth (INDEPENDENT_AMBULATORY_CARE_PROVIDER_SITE_OTHER): Payer: Medicare Other | Admitting: Neurology

## 2020-03-04 DIAGNOSIS — G4733 Obstructive sleep apnea (adult) (pediatric): Secondary | ICD-10-CM | POA: Diagnosis not present

## 2020-03-04 DIAGNOSIS — Z9989 Dependence on other enabling machines and devices: Secondary | ICD-10-CM

## 2020-03-04 NOTE — Patient Instructions (Signed)
FU 1 year with NP. Verbal instructions given.

## 2020-03-04 NOTE — Progress Notes (Addendum)
Interim history:   [name] is a 64 year old right-handed gentleman with an underlying medical history of type 2 diabetes, sinus problems, hyperlipidemia, hypertension arthritis with status post right hip replacement and shoulder surgeries, and obesity with a BMI of over 40, who Presents for virtual visit via [place] video visit for follow-up consultation of his obstructive sleep apnea, after home sleep testing and starting AutoPap therapy.  The patient is unaccompanied today.  I first met him at the request of his endocrinologist on 09/11/2019, at which time he reported snoring and witnessed apneas as well as significant daytime somnolence.  He was advised to proceed with sleep testing.  He had a home sleep test on 11/08/2019 which indicated moderate obstructive sleep apnea with an AHI of 20/h, O2 nadir of 81%.  He was advised to proceed with home AutoPap therapy.  His set up date was 12/28/2019.  Addendum, 08/05/2020: Please note, that patient was located at home and was located in my office for the MyChart video visit on 03/04/2020.  Today, 03/04/2020: I reviewed his AutoPap compliance data from 02/02/2020 through 03/02/2020, which is a total of 30 days, during which time he used his machine every night with percent use days greater than 4 hours at 70%, indicating adequate compliance with an average usage of 4 hours and 54 minutes, residual AHI at goal at 2.7/h, 95th percentile of pressure at 11.9 cm with a range of 7 to 14 cm with EPR of 3, leak acceptable with a 95th percentile at 10.5 L/min.  He reports doing well after initial adjustment.  He tolerates the nasal pillows the past and went from a large sized to a medium size which fits better.  He endorses improvement in his daytime sleepiness and sleep quality although he often does not sleep more than 5 hours on average.  He has lately experienced some difficulty falling asleep.  In the past, especially for jet lag he has tried melatonin which has not been  very helpful to him.  He has not had any other obvious triggers.  He was started on Crestor by his cardiologist in late October.  He has not had any other medication changes.  He is very motivated to continue with treatment.  Previously:  09/11/2019: (He) reports snoring and excessive daytime somnolence, as well as witnessed apneas, per wife's report.  I reviewed your office note from 08/27/2019.  His Epworth sleepiness score is 17 out of 24 today, fatigue severity score is 58 out of 63.  He has gained weight over time.  His snoring has become worse over time.  He is not aware of any family history of OSA.  He often does not sleep in the same bedroom with his wife because of his loud snoring.  He is restless.  He ends up sleeping on the couch often.  He reports no recurrent morning headaches, has a variable bedtime and rise time.  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  He quit smoking in 2008 and drinks alcohol very occasionally or rarely.  He does have the TV on in the family room more than and turns down the volume.  They have 2 dogs in the household.  He has nocturia several times a night, 3-5 on average.  He does not sleep well through the night.  He has restless leg symptoms.  He lives with his family including wife and 2 children, ages 35 and 34.   Of note, he was recently started on amlodipine less than 3 weeks  ago and in the past several days he has noted swelling around his ankles.  His Past Medical History Is Significant For: Past Medical History:  Diagnosis Date  . ADD (attention deficit disorder)   . Anxiety    hx of  . Arthritis   . Diabetes ([place])   . Eczema   . Family history of adverse reaction to anesthesia    mother slow to awaken  . Headache   . High cholesterol   . Hypertension   . Right rotator cuff tear     His Past Surgical History Is Significant For: Past Surgical History:  Procedure Laterality Date  . colonscopy  2014  . SHOULDER SURGERY Left years ago   rotator, bicep and  clavicle repair.  [name] TOTAL HIP ARTHROPLASTY Right 10/08/2014   Procedure: RIGHT TOTAL HIP ARTHROPLASTY ANTERIOR APPROACH;  Surgeon: Paralee Cancel, MD;  Location: [facility];  Service: Orthopedics;  Laterality: Right;    His Family History Is Significant For: Family History  Problem Relation Age of Onset  . Pancreatic cancer Mother   . Prostate cancer Father   . Allergic rhinitis Sister     His Social History Is Significant For: Social History   Socioeconomic History  . Marital status: Married    Spouse name: Not on file  . Number of children: Not on file  . Years of education: Not on file  . Highest education level: Not on file  Occupational History  . Not on file  Tobacco Use  . Smoking status: Former Smoker    Packs/day: 0.50    Years: 15.00    Pack years: 7.50    Types: Cigarettes    Quit date: 05/03/2006    Years since quitting: 13.8  . Smokeless tobacco: Never Used  Vaping Use  . Vaping Use: Every day  Substance and Sexual Activity  . Alcohol use: Yes    Comment: rarely  . Drug use: Never  . Sexual activity: Not on file  Other Topics Concern  . Not on file  Social History Narrative  . Not on file   Social Determinants of Health   Financial Resource Strain:   . Difficulty of Paying Living Expenses: Not on file  Food Insecurity:   . Worried About Charity fundraiser in the Last Year: Not on file  . Ran Out of Food in the Last Year: Not on file  Transportation Needs:   . Lack of Transportation (Medical): Not on file  . Lack of Transportation (Non-Medical): Not on file  Physical Activity:   . Days of Exercise per Week: Not on file  . Minutes of Exercise per Session: Not on file  Stress:   . Feeling of Stress : Not on file  Social Connections:   . Frequency of Communication with Friends and Family: Not on file  . Frequency of Social Gatherings with Friends and Family: Not on file  . Attends Religious Services: Not on file  . Active Member of Clubs or  Organizations: Not on file  . Attends Archivist Meetings: Not on file  . Marital Status: Not on file    His Allergies Are:  Allergies  Allergen Reactions  . Ampicillin     NOT sure if its ampicillin or amoxicillin---stomach cramps  :   His Current Medications Are:  Outpatient Encounter Medications as of 03/04/2020  Medication Sig  . amLODipine (NORVASC) 10 MG tablet Take 10 mg by mouth daily.  [name] aspirin EC 81 MG tablet  Take 81 mg by mouth daily. Swallow whole.  . cholecalciferol (VITAMIN D) 1000 units tablet Take 1,000 Units by mouth daily.  . citalopram (CELEXA) 40 MG tablet Take 40 mg by mouth daily.  . furosemide (LASIX) 40 MG tablet Take 40 mg by mouth daily.  . Insulin Glargine (LANTUS SOLOSTAR [place]) Inject into the skin. 20 units daily  . insulin lispro (HUMALOG) 100 UNIT/ML injection Inject into the skin 3 (three) times daily before meals. 10 units 3 times  . losartan (COZAAR) 50 MG tablet Take 1 tablet (50 mg total) by mouth daily.  . metFORMIN (GLUCOPHAGE) 500 MG tablet Take by mouth.  . nebivolol (BYSTOLIC) 5 MG tablet Take 1 tablet by mouth daily.  . Omega-3 Fatty Acids (FISH OIL PO) Take 3,600 mg by mouth every evening.  . polyvinyl alcohol (LIQUIFILM TEARS) 1.4 % ophthalmic solution Place 1 drop into both eyes 4 (four) times daily as needed for dry eyes.  . Potassium 99 MG TABS Take 1 tablet by mouth every evening.  . rosuvastatin (CRESTOR) 10 MG tablet Take 1 tablet (10 mg total) by mouth daily.   No facility-administered encounter medications on file as of 03/04/2020.  :  Review of Systems:  Out of a complete 14 point review of systems, all are reviewed and negative with the exception of these symptoms as listed below:  Virtual Visit via Video Note on 03/04/2020:   I connected with Mr. Sadler on 03/04/20 at  9:30 AM EDT by a video enabled telemedicine application and verified that I am speaking with the correct person using two identifiers.   I discussed  the limitations of evaluation and management by telemedicine and the availability of in person appointments. The patient expressed understanding and agreed to proceed.  History of Present Illness: See above.   Observations/Objective:   Assessment and Plan: In summary, Steven Contreras is a very pleasant 64 year old male with an underlying medical history of type 2 diabetes, sinus problems, hyperlipidemia, hypertension arthritis with status post right hip replacement and shoulder surgeries, and obesity with a BMI of over 40, who presents for follow-up consultation of his obstructive sleep apnea after interim home sleep testing and starting AutoPap therapy.  His home sleep test from 11/08/2019 indicated moderate obstructive sleep apnea with an AHI of 20/h, O2 nadir of 81%.  He has established treatment with AutoPap since late August 2021 and is currently compliant with treatment.  He is encouraged to try to make enough time for sleep and use his machine every night, all night if possible.  He is currently averaging just shy of 5 hours of usage time on average.  His AHI scores are at goal, average pressure right around 12 cm with a pressure range of 7 to 14 cm with EPR.   He endorses improvement with regards to his sleep quality and sleep consolidation as well as daytime energy.  He uses nasal pillows successfully and his numbers as far as leak is concerned are also adequate.  He is encouraged to continue to work on weight loss, he has lost a little bit of weight since we first met. He is advised to follow-up routinely in 1 year.  He can follow-up with one of our nurse practitioners and is encouraged to call with any interim questions or concerns.   I answered all his questions today and the patient was in agreement.   Follow Up Instructions:    I discussed the assessment and treatment plan with the patient.  The patient was provided an opportunity to ask questions and all were answered. The patient agreed  with the plan and demonstrated an understanding of the instructions.   The patient was advised to call back or seek an in-person evaluation if the symptoms worsen or if the condition fails to improve as anticipated.  I spent 30 minutes in total non-face-to-face time and in reviewing records during pre-charting, more than 50% of which was spent in counseling and coordination of care, reviewing test results, reviewing medications and treatment regimen and/or in discussing or reviewing the diagnosis of OSA, the prognosis and treatment options. Pertinent laboratory and imaging test results that were available during this visit with the patient were reviewed by me and considered in my medical decision making (see chart for details).     Star Age, MD

## 2020-03-04 NOTE — Telephone Encounter (Signed)
Pt returned my call. Pt's meds, allergies, and PMH were updated.  I assisted pt getting logged int his mychart video visit.

## 2020-03-05 ENCOUNTER — Other Ambulatory Visit: Payer: Self-pay

## 2020-03-05 ENCOUNTER — Ambulatory Visit: Payer: Commercial Managed Care - PPO

## 2020-03-05 DIAGNOSIS — R0609 Other forms of dyspnea: Secondary | ICD-10-CM

## 2020-03-05 DIAGNOSIS — R0600 Dyspnea, unspecified: Secondary | ICD-10-CM

## 2020-03-10 ENCOUNTER — Ambulatory Visit: Payer: Medicare Other | Admitting: Cardiology

## 2020-03-10 DIAGNOSIS — R9439 Abnormal result of other cardiovascular function study: Secondary | ICD-10-CM | POA: Insufficient documentation

## 2020-03-10 NOTE — Progress Notes (Signed)
Patient referred by Jonathon Jordan, MD for screening for CAD  Subjective:   [place], male    DOB: 10/02/1955, 64 y.o.   MRN: [id number]   Chief Complaint  Patient presents with  . Hypertension  . Follow-up  . Results    echo, stress test     HPI  64 y.o. Caucasian male with hypertension, hyperlipidemia, type 2 DM, elevated calcium score, abnormal stress test  Patient denies overt chest pain and shortness of breath. Recent CT scan and stress test results reviewed with the patient, details below.   Initial consultation HPI 64/2021: Patient is a retired [place], and Chief Financial Officer.  He is to be very active until couple years ago, when he started having issues with his right hip.  He is has continued to have right hip pain after undergoing hip replacement.  He has also had pyriformis syndrome.  This is limited his physical activity significantly.  He is not able to walk beyond 2/10 of a mile without having to stop.  Ever since decline in his physical activity, he has gained about 60 pounds of weight.  This is also been coupled with increase in his blood pressure, blood sugar, and cholesterol levels.  He denies any exertional chest pain.  However, he does report dyspnea on more than usual activity, such as working in his yard.  He is currently on amlodipine 10 mg, nebivolol 5 mg, Crestor 5 mg.  He has had severe cough with lisinopril in the past.     Current Outpatient Medications on File Prior to Visit  Medication Sig Dispense Refill  . amLODipine (NORVASC) 10 MG tablet Take 10 mg by mouth daily.    [name] aspirin EC 81 MG tablet Take 81 mg by mouth daily. Swallow whole.    . cholecalciferol (VITAMIN D) 1000 units tablet Take 1,000 Units by mouth daily.    . citalopram (CELEXA) 40 MG tablet Take 40 mg by mouth daily.    . furosemide (LASIX) 40 MG tablet Take 40 mg by mouth daily.    . Insulin Glargine (LANTUS SOLOSTAR [place]) Inject into the skin. 20 units daily    .  insulin lispro (HUMALOG) 100 UNIT/ML injection Inject into the skin 3 (three) times daily before meals. 10 units 3 times    . losartan (COZAAR) 50 MG tablet Take 1 tablet (50 mg total) by mouth daily. 30 tablet 3  . metFORMIN (GLUCOPHAGE) 500 MG tablet Take by mouth.    . nebivolol (BYSTOLIC) 5 MG tablet Take 1 tablet by mouth daily.    . Omega-3 Fatty Acids (FISH OIL PO) Take 3,600 mg by mouth every evening.    . polyvinyl alcohol (LIQUIFILM TEARS) 1.4 % ophthalmic solution Place 1 drop into both eyes 4 (four) times daily as needed for dry eyes.    . Potassium 99 MG TABS Take 1 tablet by mouth every evening.    . rosuvastatin (CRESTOR) 10 MG tablet Take 1 tablet (10 mg total) by mouth daily. 30 tablet 3   No current facility-administered medications on file prior to visit.    Cardiovascular and other pertinent studies:  Lexiscan (Walking with mod [place])Tetrofosmin Stress Test  03/05/2020: Nondiagnostic ECG stress, pharmacologic stresss. There is a  moderate defect in the lateral, inferior and apical regions, due to prominent gut uptake artifact possibly large hiatal hernia noted in the inferolateral wall. In the absence of wall motion abnormality and normal LVEF, ischemia is less likely. Overall LV systolic  function is normal without regional wall motion abnormalities. Stress LV EF: 62%.  No previous exam available for comparison. Low risk. Clinical correlation recommended.  Echocardiogram 02/26/2020:  Left ventricle cavity is normal in size and wall thickness. Normal global  wall motion. Normal LV systolic function with EF 65%. Normal diastolic  filling pattern.  No significant valvular abnormality.  normal right atrial pressure.   CT Cardiac scoring 02/28/2020: Total: 287 LM: 0 LAD: 99 LCx: 126 RCA: 62 Cardiac anatomy: Normal Visualized thorax: RLL linear atelectasis/scarring  EKG 02/22/2020: Sinus rhythm 67 bpm Left axis deviation Poor R wave progression  Recent  labs: 01/28/2020: Glucose 147, BUN/Cr 15/12.8. EGFR 64.56. Na/K 141/4.2. Rest of the CMP normal HbA1C 6.5% Chol 235, TG 273, HDL 49, LDL 131  06/2018: Glucose N/A, BUN/Cr 8/0.81. EGFR normal. Na/K 138/4.6. ALT 47. Rest of the CMP normal Chol 231, TG 335, HDL 57, LDL 107 TSH 2.0 normal   Review of Systems  Cardiovascular: Positive for dyspnea on exertion. Negative for chest pain, leg swelling, palpitations and syncope.  Musculoskeletal: Positive for joint pain.        Vitals:   03/11/20 1526  BP: (!) 146/88  Pulse: 69  Resp: 16  SpO2: 97%     Body mass index is 39.08 kg/m. Filed Weights   03/11/20 1526  Weight: 257 lb (116.6 kg)     Objective:   Physical Exam Vitals and nursing note reviewed.  Constitutional:      General: He is not in acute distress. Neck:     Vascular: No JVD.  Cardiovascular:     Rate and Rhythm: Normal rate and regular rhythm.     Pulses:          Dorsalis pedis pulses are 2+ on the right side and 1+ on the left side.       Posterior tibial pulses are 1+ on the right side and 1+ on the left side.     Heart sounds: Normal heart sounds. No murmur heard.   Pulmonary:     Effort: Pulmonary effort is normal.     Breath sounds: Normal breath sounds. No wheezing or rales.         Assessment & Recommendations:   64 y.o. Caucasian male with hypertension, hyperlipidemia, type 2 DM, elevated calcium score, abnormal stress test  CAD:  Nuclear stress test reported as "moderate defect in the lateral, inferior and apical regions, due to prominent gut uptake artifact possibly large hiatal hernia noted in the inferolateral wall. In the absence of wall motion abnormality and normal LVEF, ischemia is less likely". However, elevated calcium score with in all three vessels suggest ischemia cannot be excluded. There is no left main calcification. He does not have lifestyle limiting chest pain or shortness of breath symptoms. Reasonable continue medical  management for now.   Hyperlipidemia: Patient do shave some degree of insomnia and myalgias since starting Crestor, but symptoms are mild. We will attempt increasing Crestor to 20 mg. He may start with alternate day 20 mg and 10 mg.  Hypertension: Uncontrolled. Increase bystolic to 10 mg. Continue amlodipine 10 mg, losartan 50 mg.  Hip pain: Do not suspect vascular claudication.  F/u in 3 months after repeat lipid panel   Nigel Mormon, MD Pager: [phone] Office: [phone]

## 2020-03-11 ENCOUNTER — Ambulatory Visit: Payer: Commercial Managed Care - PPO | Admitting: Cardiology

## 2020-03-11 ENCOUNTER — Encounter: Payer: Self-pay | Admitting: Cardiology

## 2020-03-11 ENCOUNTER — Other Ambulatory Visit: Payer: Self-pay

## 2020-03-11 VITALS — BP 146/88 | HR 69 | Resp 16 | Ht 68.0 in | Wt 257.0 lb

## 2020-03-11 DIAGNOSIS — R9439 Abnormal result of other cardiovascular function study: Secondary | ICD-10-CM

## 2020-03-11 DIAGNOSIS — R0609 Other forms of dyspnea: Secondary | ICD-10-CM

## 2020-03-11 DIAGNOSIS — R931 Abnormal findings on diagnostic imaging of heart and coronary circulation: Secondary | ICD-10-CM

## 2020-03-11 DIAGNOSIS — R0600 Dyspnea, unspecified: Secondary | ICD-10-CM

## 2020-03-11 DIAGNOSIS — I10 Essential (primary) hypertension: Secondary | ICD-10-CM

## 2020-03-11 DIAGNOSIS — E782 Mixed hyperlipidemia: Secondary | ICD-10-CM

## 2020-03-11 MED ORDER — NEBIVOLOL HCL 5 MG PO TABS: 10.0000 mg | ORAL_TABLET | Freq: Every day | ORAL | 3 refills | Status: DC

## 2020-03-11 MED ORDER — ROSUVASTATIN CALCIUM 20 MG PO TABS: 20.0000 mg | ORAL_TABLET | Freq: Every day | ORAL | 3 refills | Status: DC

## 2020-03-19 ENCOUNTER — Ambulatory Visit: Payer: Medicare Other | Admitting: Cardiology

## 2020-06-11 ENCOUNTER — Encounter: Payer: Self-pay | Admitting: Cardiology

## 2020-06-11 ENCOUNTER — Other Ambulatory Visit: Payer: Self-pay

## 2020-06-11 ENCOUNTER — Ambulatory Visit: Payer: Medicare Other | Admitting: Cardiology

## 2020-06-11 VITALS — BP 138/78 | HR 70 | Temp 97.8°F | Resp 17 | Ht 68.0 in | Wt 254.6 lb

## 2020-06-11 DIAGNOSIS — R931 Abnormal findings on diagnostic imaging of heart and coronary circulation: Secondary | ICD-10-CM | POA: Insufficient documentation

## 2020-06-11 DIAGNOSIS — I2510 Atherosclerotic heart disease of native coronary artery without angina pectoris: Secondary | ICD-10-CM

## 2020-06-11 DIAGNOSIS — E782 Mixed hyperlipidemia: Secondary | ICD-10-CM

## 2020-06-11 DIAGNOSIS — I10 Essential (primary) hypertension: Secondary | ICD-10-CM

## 2020-06-11 NOTE — Progress Notes (Signed)
Patient referred by Jonathon Jordan, MD for screening for CAD  Subjective:   Steven Contreras, male    DOB: [birth year], 65 y.o.   MRN: [id number]   Chief Complaint  Patient presents with  . Follow-up     HPI  65 y.o. Caucasian male with hypertension, hyperlipidemia, type 2 DM, elevated calcium score, abnormal stress test  Patient is doing well on current medical therapy, and denies any angina or angina equivalent symptoms.  His biggest complaint remains his right hip pain and sciatica.  Reviewed recent labs with the patient, details below.  Initial consultation HPI 02/2020: Patient is a retired [place], and Chief Financial Officer.  He is to be very active until couple years ago, when he started having issues with his right hip.  He is has continued to have right hip pain after undergoing hip replacement.  He has also had pyriformis syndrome.  This is limited his physical activity significantly.  He is not able to walk beyond 2/10 of a mile without having to stop.  Ever since decline in his physical activity, he has gained about 60 pounds of weight.  This is also been coupled with increase in his blood pressure, blood sugar, and cholesterol levels.  He denies any exertional chest pain.  However, he does report dyspnea on more than usual activity, such as working in his yard.  He is currently on amlodipine 10 mg, nebivolol 5 mg, Crestor 5 mg.  He has had severe cough with lisinopril in the past.     Current Outpatient Medications on File Prior to Visit  Medication Sig Dispense Refill  . albuterol (VENTOLIN HFA) 108 (90 Base) MCG/ACT inhaler Inhale 2 puffs into the lungs every 4 (four) hours as needed.    [name] amLODipine (NORVASC) 10 MG tablet Take 10 mg by mouth daily.    [name] aspirin EC 81 MG tablet Take 81 mg by mouth daily. Swallow whole.    . citalopram (CELEXA) 40 MG tablet Take 40 mg by mouth daily.    . furosemide (LASIX) 40 MG tablet Take 40 mg by mouth daily.    . Insulin  Glargine (LANTUS SOLOSTAR [place]) Inject into the skin. 20 units daily    . losartan (COZAAR) 50 MG tablet Take 1 tablet (50 mg total) by mouth daily. 30 tablet 3  . metFORMIN (GLUCOPHAGE) 500 MG tablet Take by mouth.    . nebivolol (BYSTOLIC) 5 MG tablet Take 2 tablets (10 mg total) by mouth daily. 90 tablet 3  . polyvinyl alcohol (LIQUIFILM TEARS) 1.4 % ophthalmic solution Place 1 drop into both eyes 4 (four) times daily as needed for dry eyes.    . Potassium 99 MG TABS Take 1 tablet by mouth every evening.    . rosuvastatin (CRESTOR) 20 MG tablet Take 1 tablet (20 mg total) by mouth daily. 90 tablet 3  . Vitamin D, Ergocalciferol, 50 MCG (2000 UT) CAPS Take 50 mcg by mouth daily.    [name] zolpidem (AMBIEN) 10 MG tablet Take 10 mg by mouth at bedtime as needed.    . cholecalciferol (VITAMIN D) 1000 units tablet Take 1,000 Units by mouth daily.    . insulin lispro (HUMALOG) 100 UNIT/ML injection Inject into the skin 3 (three) times daily before meals. 10 units 3 times    . Omega-3 Fatty Acids (FISH OIL PO) Take 3,600 mg by mouth every evening.     No current facility-administered medications on file prior to visit.  Cardiovascular and other pertinent studies:  Lexiscan (Walking with mod Bruce)Tetrofosmin Stress Test  03/05/2020: Nondiagnostic ECG stress, pharmacologic stresss. There is a  moderate defect in the lateral, inferior and apical regions, due to prominent gut uptake artifact possibly large hiatal hernia noted in the inferolateral wall. In the absence of wall motion abnormality and normal LVEF, ischemia is less likely. Overall LV systolic function is normal without regional wall motion abnormalities. Stress LV EF: 62%.  No previous exam available for comparison. Low risk. Clinical correlation recommended.  Echocardiogram 02/26/2020:  Left ventricle cavity is normal in size and wall thickness. Normal global  wall motion. Normal LV systolic function with EF 65%. Normal diastolic  filling  pattern.  No significant valvular abnormality.  normal right atrial pressure.   CT Cardiac scoring 02/28/2020: Total: 287 LM: 0 LAD: 99 LCx: 126 RCA: 62 Cardiac anatomy: Normal Visualized thorax: RLL linear atelectasis/scarring  EKG 02/22/2020: Sinus rhythm 67 bpm Left axis deviation Poor R wave progression  Recent labs: 05/09/2020: Glucose 157, BUN/Cr 15/1.03. EGFR 76. HbA1C 7.1% Chol 141, TG 170, HDL 53, LDL 59 TSH 1.4 normal  01/28/2020: Glucose 147, BUN/Cr 15/12.8. EGFR 64.56. Na/K 141/4.2. Rest of the CMP normal HbA1C 6.5% Chol 235, TG 273, HDL 49, LDL 131  06/2018: Glucose N/A, BUN/Cr 8/0.81. EGFR normal. Na/K 138/4.6. ALT 47. Rest of the CMP normal Chol 231, TG 335, HDL 57, LDL 107 TSH 2.0 normal   Review of Systems  Cardiovascular: Positive for dyspnea on exertion. Negative for chest pain, leg swelling, palpitations and syncope.  Musculoskeletal: Positive for joint pain.        Vitals:   06/11/20 1324  BP: 138/78  Pulse: 70  Resp: 17  Temp: 97.8 F (36.6 C)  SpO2: 97%     Body mass index is 38.71 kg/m. Filed Weights   06/11/20 1324  Weight: 254 lb 9.6 oz (115.5 kg)     Objective:   Physical Exam Vitals and nursing note reviewed.  Constitutional:      General: He is not in acute distress. Neck:     Vascular: No JVD.  Cardiovascular:     Rate and Rhythm: Normal rate and regular rhythm.     Pulses:          Dorsalis pedis pulses are 2+ on the right side and 1+ on the left side.       Posterior tibial pulses are 1+ on the right side and 1+ on the left side.     Heart sounds: Normal heart sounds. No murmur heard.   Pulmonary:     Effort: Pulmonary effort is normal.     Breath sounds: Normal breath sounds. No wheezing or rales.         Assessment & Recommendations:   65 y.o. Caucasian male with hypertension, hyperlipidemia, type 2 DM, elevated calcium score, abnormal stress test  CAD without angina:  Nuclear stress test reported  as "moderate defect in the lateral, inferior and apical regions, due to prominent gut uptake artifact possibly large hiatal hernia noted in the inferolateral wall. In the absence of wall motion abnormality and normal LVEF, ischemia is less likely". However, elevated calcium score with in all three vessels suggest ischemia cannot be excluded. There is no left main calcification. He does not have lifestyle limiting chest pain or shortness of breath symptoms. Reasonable continue medical management for now.  Continue aspirin 81 mg, rosuvastatin 20 mg, nebivolol 10 mg.    Mixed hyperlipidemia: LDL down to 59  on rosuvastatin 10 mg.  Continue the same.  Hypertension: Well-controlled.    Hip pain: Do not suspect vascular claudication.  F/u in 1 year    Nigel Mormon, MD Pager: [phone] Office: [phone]

## 2020-12-03 ENCOUNTER — Encounter: Payer: Self-pay | Admitting: Family Medicine

## 2020-12-03 ENCOUNTER — Ambulatory Visit (INDEPENDENT_AMBULATORY_CARE_PROVIDER_SITE_OTHER): Payer: Medicare Other | Admitting: Family Medicine

## 2020-12-03 ENCOUNTER — Ambulatory Visit (INDEPENDENT_AMBULATORY_CARE_PROVIDER_SITE_OTHER): Payer: Medicare Other

## 2020-12-03 ENCOUNTER — Other Ambulatory Visit: Payer: Self-pay

## 2020-12-03 VITALS — BP 140/80 | HR 88 | Ht 68.0 in | Wt 250.0 lb

## 2020-12-03 DIAGNOSIS — M5416 Radiculopathy, lumbar region: Secondary | ICD-10-CM

## 2020-12-03 DIAGNOSIS — Z6838 Body mass index (BMI) 38.0-38.9, adult: Secondary | ICD-10-CM

## 2020-12-03 DIAGNOSIS — E669 Obesity, unspecified: Secondary | ICD-10-CM | POA: Insufficient documentation

## 2020-12-03 DIAGNOSIS — M545 Low back pain, unspecified: Secondary | ICD-10-CM

## 2020-12-03 DIAGNOSIS — R931 Abnormal findings on diagnostic imaging of heart and coronary circulation: Secondary | ICD-10-CM | POA: Diagnosis not present

## 2020-12-03 MED ORDER — PREDNISONE 20 MG PO TABS: 20.0000 mg | ORAL_TABLET | Freq: Every day | ORAL | 0 refills | Status: DC

## 2020-12-03 MED ORDER — KETOROLAC TROMETHAMINE 60 MG/2ML IM SOLN
60.0000 mg | Freq: Once | INTRAMUSCULAR | Status: AC
  Administered 2020-12-03: 60 mg via INTRAMUSCULAR

## 2020-12-03 MED ORDER — METHYLPREDNISOLONE ACETATE 80 MG/ML IJ SUSP
80.0000 mg | Freq: Once | INTRAMUSCULAR | Status: AC
  Administered 2020-12-03: 80 mg via INTRAMUSCULAR

## 2020-12-03 MED ORDER — GABAPENTIN 100 MG PO CAPS: ORAL_CAPSULE | ORAL | 3 refills | Status: DC

## 2020-12-03 NOTE — Assessment & Plan Note (Signed)
Patient is having signs and symptoms consistent with more of a lumbar radiculopathy.  Concern for possible L2-L3 area.  We will get x-rays to rule out anything such as a compression fracture.  Toradol and Depo-Medrol given today.  We will do a short course of prednisone at 20 mg.  Patient has done better with his blood sugar so we will monitor closely.  In addition to this patient is very motivated to lose weight and we will send patient to healthy weight and wellness.  BMI is 38.  Patient was started on gabapentin to help him with some of the neurologic component of this.  If worsening pain advanced imaging may be warranted.  Follow-up with me again in 3 weeks.

## 2020-12-03 NOTE — Assessment & Plan Note (Signed)
Obesity noted. A healthy weight and wellness which will help with core strengthening as well if patient is able to lose some weight.

## 2020-12-03 NOTE — Progress Notes (Signed)
[name] [address] [place] [address] Phone: [phone] Subjective:    I'm seeing this patient by the request  of:  [name], MD  CC: Back pain in the pain  [id number]  Steven Contreras is a 65 y.o. male coming in with complaint of back pain. Last seen in 2019 for OMT. Patient states That low back/sciatic region on left side is very painful, hard to stand from sitting position.  Patient does state that there is radiation down the leg.  Seems to be only on the left side.  Seems to stop in the quadricep area.   Resolved patient previously in 2019 for a pain in the hip after replacement.  Patient responded fairly well to osteopathic manipulation.    Past Medical History:  Diagnosis Date   ADD (attention deficit disorder)    Anxiety    hx of   Arthritis    Diabetes (HCC)    Eczema    Family history of adverse reaction to anesthesia    mother slow to awaken   Headache    High cholesterol    Hypertension    Right rotator cuff tear    Past Surgical History:  Procedure Laterality Date   colonscopy  2014   SHOULDER SURGERY Left years ago   rotator, bicep and clavicle repair.   TOTAL HIP ARTHROPLASTY Right 10/08/2014   Procedure: RIGHT TOTAL HIP ARTHROPLASTY ANTERIOR APPROACH;  Surgeon: Durene Romans, MD;  Location: [facility];  Service: Orthopedics;  Laterality: Right;   Social History   Socioeconomic History   Marital status: Married    Spouse name: Not on file   Number of children: 3   Years of education: Not on file   Highest education level: Not on file  Occupational History   Not on file  Tobacco Use   Smoking status: Former    Packs/day: 0.50    Years: 15.00    Pack years: 7.50    Types: Cigarettes    Quit date: 05/03/2006    Years since quitting: 14.5   Smokeless tobacco: Never  Vaping Use   Vaping Use: Former  Substance and Sexual Activity   Alcohol use: Yes    Comment: rarely   Drug use: Never   Sexual  activity: Not on file  Other Topics Concern   Not on file  Social History Narrative   Not on file   Social Determinants of Health   Financial Resource Strain: Not on file  Food Insecurity: Not on file  Transportation Needs: Not on file  Physical Activity: Not on file  Stress: Not on file  Social Connections: Not on file   Allergies  Allergen Reactions   Ace Inhibitors Other (See Comments)   Ampicillin     NOT sure if its ampicillin or amoxicillin---stomach cramps   Bupropion Other (See Comments)   Venlafaxine Other (See Comments)   Family History  Problem Relation Age of Onset   Pancreatic cancer Mother    Prostate cancer Father    Allergic rhinitis Sister     Current Outpatient Medications (Endocrine & Metabolic):    Insulin Glargine (LANTUS SOLOSTAR [place]), Inject into the skin. 20 units daily   insulin lispro (HUMALOG) 100 UNIT/ML injection, Inject into the skin 3 (three) times daily before meals. 10 units 3 times   metFORMIN (GLUCOPHAGE) 500 MG tablet, Take by mouth.   predniSONE (DELTASONE) 20 MG tablet, Take 1 tablet (20 mg total) by mouth daily with  breakfast.  Current Outpatient Medications (Cardiovascular):    amLODipine (NORVASC) 10 MG tablet, Take 10 mg by mouth daily.   furosemide (LASIX) 40 MG tablet, Take 40 mg by mouth daily.   nebivolol (BYSTOLIC) 5 MG tablet, Take 2 tablets (10 mg total) by mouth daily.   losartan (COZAAR) 50 MG tablet, Take 1 tablet (50 mg total) by mouth daily.   rosuvastatin (CRESTOR) 20 MG tablet, Take 1 tablet (20 mg total) by mouth daily.  Current Outpatient Medications (Respiratory):    albuterol (VENTOLIN HFA) 108 (90 Base) MCG/ACT inhaler, Inhale 2 puffs into the lungs every 4 (four) hours as needed.  Current Outpatient Medications (Analgesics):    aspirin EC 81 MG tablet, Take 81 mg by mouth daily. Swallow whole.   Current Outpatient Medications (Other):    cholecalciferol (VITAMIN D) 1000 units tablet, Take 1,000 Units by  mouth daily.   citalopram (CELEXA) 40 MG tablet, Take 40 mg by mouth daily.   gabapentin (NEURONTIN) 100 MG capsule, Take two capsules (total 200mg ) daily at bedtime   Omega-3 Fatty Acids (FISH OIL PO), Take 3,600 mg by mouth every evening.   polyvinyl alcohol (LIQUIFILM TEARS) 1.4 % ophthalmic solution, Place 1 drop into both eyes 4 (four) times daily as needed for dry eyes.   Potassium 99 MG TABS, Take 1 tablet by mouth every evening.   Vitamin D, Ergocalciferol, 50 MCG (2000 UT) CAPS, Take 50 mcg by mouth daily.   zolpidem (AMBIEN) 10 MG tablet, Take 10 mg by mouth at bedtime as needed.   Reviewed prior external information including notes and imaging from  primary care provider As well as notes that were available from care everywhere and other healthcare systems.  Past medical history, social, surgical and family history all reviewed in electronic medical record.  No pertanent information unless stated regarding to the chief complaint.   Review of Systems:  No headache, visual changes, nausea, vomiting, diarrhea, constipation, dizziness, abdominal pain, skin rash, fevers, chills, night sweats, weight loss, swollen lymph nodes, body aches, joint swelling, chest pain, shortness of breath, mood changes. POSITIVE muscle aches  Objective  Blood pressure 140/80, pulse 88, height 5\' 8"  (1.727 m), weight 250 lb (113.4 kg), SpO2 98 %.   General: No apparent distress alert and oriented x3 mood and affect normal, dressed appropriately.  HEENT: Pupils equal, extraocular movements intact  Respiratory: Patient's speak in full sentences and does not appear short of breath  Cardiovascular: No lower extremity edema, non tender, no erythema  Gait antalgic noted. MSK: Poor core strength noted.  Patient does have a mild positive straight leg on the left side.  Difficulty with any range of motion of the left hip with external range of motion or internal range of motion.  Neurovascular intact distally.   Severe tenderness over the L2 and L3 area on the left side.    Impression and Recommendations:     The above documentation has been reviewed and is accurate and complete , DO

## 2020-12-03 NOTE — Patient Instructions (Addendum)
Good to see you  Injections given today X-ray on your way out Prednisone 20mg  daily Gabapentin 200mg  daily at night time  Referral placed for Dr.  See me again in 3-4 weeks

## 2020-12-04 ENCOUNTER — Encounter: Payer: Self-pay | Admitting: Family Medicine

## 2020-12-05 ENCOUNTER — Ambulatory Visit: Payer: Commercial Managed Care - PPO | Admitting: Family Medicine

## 2020-12-05 ENCOUNTER — Other Ambulatory Visit: Payer: Self-pay

## 2020-12-05 ENCOUNTER — Other Ambulatory Visit (INDEPENDENT_AMBULATORY_CARE_PROVIDER_SITE_OTHER): Payer: Medicare Other

## 2020-12-05 DIAGNOSIS — M2550 Pain in unspecified joint: Secondary | ICD-10-CM | POA: Diagnosis not present

## 2020-12-05 LAB — CBC WITH DIFFERENTIAL/PLATELET
Basophils Absolute: 0.1 10*3/uL (ref 0.0–0.1)
Basophils Relative: 0.5 % (ref 0.0–3.0)
Eosinophils Absolute: 0.1 10*3/uL (ref 0.0–0.7)
Eosinophils Relative: 1.1 % (ref 0.0–5.0)
HCT: 44.7 % (ref 39.0–52.0)
Hemoglobin: 15.1 g/dL (ref 13.0–17.0)
Lymphocytes Relative: 12.9 % (ref 12.0–46.0)
Lymphs Abs: 1.4 10*3/uL (ref 0.7–4.0)
MCHC: 33.8 g/dL (ref 30.0–36.0)
MCV: 83.7 fl (ref 78.0–100.0)
Monocytes Absolute: 0.6 10*3/uL (ref 0.1–1.0)
Monocytes Relative: 5 % (ref 3.0–12.0)
Neutro Abs: 9.1 10*3/uL — ABNORMAL HIGH (ref 1.4–7.7)
Neutrophils Relative %: 80.5 % — ABNORMAL HIGH (ref 43.0–77.0)
Platelets: 288 10*3/uL (ref 150.0–400.0)
RBC: 5.34 Mil/uL (ref 4.22–5.81)
RDW: 13.1 % (ref 11.5–15.5)
WBC: 11.2 10*3/uL — ABNORMAL HIGH (ref 4.0–10.5)

## 2020-12-05 LAB — URINALYSIS
Bilirubin Urine: NEGATIVE
Hgb urine dipstick: NEGATIVE
Ketones, ur: NEGATIVE
Leukocytes,Ua: NEGATIVE
Nitrite: NEGATIVE
Specific Gravity, Urine: 1.015 (ref 1.000–1.030)
Total Protein, Urine: NEGATIVE
Urine Glucose: NEGATIVE
Urobilinogen, UA: 0.2 (ref 0.0–1.0)
pH: 5.5 (ref 5.0–8.0)

## 2020-12-05 LAB — SEDIMENTATION RATE: Sed Rate: 19 mm/hr (ref 0–20)

## 2020-12-08 ENCOUNTER — Other Ambulatory Visit: Payer: Self-pay

## 2020-12-08 ENCOUNTER — Encounter: Payer: Self-pay | Admitting: Family Medicine

## 2020-12-08 DIAGNOSIS — N200 Calculus of kidney: Secondary | ICD-10-CM

## 2020-12-10 ENCOUNTER — Ambulatory Visit
Admission: RE | Admit: 2020-12-10 | Discharge: 2020-12-10 | Disposition: A | Discharge status: Home / Self Care | Payer: Medicare Other | Source: Ambulatory Visit | Attending: Family Medicine | Admitting: Family Medicine

## 2020-12-10 ENCOUNTER — Other Ambulatory Visit: Payer: Self-pay

## 2020-12-10 DIAGNOSIS — N200 Calculus of kidney: Secondary | ICD-10-CM

## 2020-12-11 ENCOUNTER — Other Ambulatory Visit: Payer: Self-pay

## 2020-12-11 ENCOUNTER — Encounter: Payer: Self-pay | Admitting: Family Medicine

## 2020-12-11 DIAGNOSIS — R109 Unspecified abdominal pain: Secondary | ICD-10-CM

## 2020-12-11 DIAGNOSIS — N200 Calculus of kidney: Secondary | ICD-10-CM

## 2020-12-11 NOTE — Progress Notes (Signed)
Referrals placed 

## 2020-12-11 NOTE — Addendum Note (Signed)
Addended by: Edwena Felty T on: 12/11/2020 01:59 PM   Modules accepted: Orders

## 2020-12-16 ENCOUNTER — Other Ambulatory Visit: Payer: Self-pay | Admitting: Urology

## 2020-12-16 ENCOUNTER — Other Ambulatory Visit (HOSPITAL_COMMUNITY): Payer: Self-pay | Admitting: Urology

## 2020-12-16 DIAGNOSIS — N200 Calculus of kidney: Secondary | ICD-10-CM

## 2020-12-16 NOTE — Progress Notes (Signed)
Sent message, via epic in basket, requesting orders in epic from surgeon.  

## 2020-12-16 NOTE — Patient Instructions (Addendum)
DUE TO COVID-19 ONLY ONE VISITOR IS ALLOWED TO COME WITH YOU AND STAY IN THE WAITING ROOM ONLY DURING PRE OP AND PROCEDURE.   **NO VISITORS ARE ALLOWED IN THE SHORT STAY AREA OR RECOVERY ROOM!!**  IF YOU WILL BE ADMITTED INTO THE HOSPITAL YOU ARE ALLOWED ONLY TWO SUPPORT PEOPLE DURING VISITATION HOURS ONLY (10AM -8PM)   The support person(s) may change daily. The support person(s) must pass our screening, gel in and out, and wear a mask at all times, including in the patient's room. Patients must also wear a mask when staff or their support person are in the room.  No visitors under the age of 91. Any visitor under the age of 16 must be accompanied by an adult.    COVID SWAB TESTING MUST BE COMPLETED ON:  Thursday, Aug. 25, 2022 **MUST PRESENT COMPLETED FORM AT TESTING SITE**    [address] [place] (backside of the building) You are not required to quarantine, however you are required to wear a well-fitted mask when you are out and around people not in your household.  Hand Hygiene often Do NOT share personal items Notify your provider if you are in close contact with someone who has COVID or you develop fever 100.4 or greater, new onset of sneezing, cough, sore throat, shortness of breath or body aches.  [facility] Entrance [address], [address], must go inside of the hospital, NOT A DRIVE THRU!  (Must self quarantine after testing. Follow instructions on handout.)       Your procedure is scheduled on: Monday, Aug. 29, 2022   Report to [facility] Main  Entrance    Report to admitting at 8:00 AM   Call this number if you have problems the morning of surgery [phone]   Do not eat food :After Midnight.   May have liquids until 8:00AM   day of surgery  CLEAR LIQUID DIET  Foods Allowed                                                                     Foods Excluded  Water, Black Coffee and tea, regular and  decaf                             liquids that you cannot  Plain Jell-O in any flavor  (No red)                                           see through such as: Fruit ices (not with fruit pulp)                                     milk, soups, orange juice              Iced Popsicles (No red)  All solid food                                   Apple juices Sports drinks like Gatorade (No red) Lightly seasoned clear broth or consume(fat free) Sugar, honey syrup  Sample Menu Breakfast                                Lunch                                     Supper Cranberry juice                    Beef broth                            Chicken broth Jell-O                                     Grape juice                           Apple juice Coffee or tea                        Jell-O                                      Popsicle                                                Coffee or tea                        Coffee or tea       Oral Hygiene is also important to reduce your risk of infection.                                    Remember - BRUSH YOUR TEETH THE MORNING OF SURGERY WITH YOUR REGULAR TOOTHPASTE   Do NOT smoke after Midnight  Take these medicines the morning of surgery with A SIP OF WATER: Amlodipine, Citalopram, Nebivolol, Prednisone, Rosuvastatin  DO NOT TAKE ANY ORAL DIABETIC MEDICATIONS DAY OF YOUR SURGERY                              You may not have any metal on your body including hair pins, jewelry, and body piercing             Do not wear lotions, powders, perfumes/cologne, or deodorant              Men may shave face and neck.   Do not bring valuables to the hospital. [facility] IS NOT             RESPONSIBLE  FOR VALUABLES.   Contacts, dentures or bridgework may not be worn into surgery.   Bring small overnight bag day of surgery.    Special Instructions: Bring a copy of your healthcare power of attorney and living will  documents         the day of surgery if you haven't scanned them in before.              Please read over the following fact sheets you were given: IF YOU HAVE QUESTIONS ABOUT YOUR PRE OP INSTRUCTIONS PLEASE CALL [phone]  How to Manage Your Diabetes Before and After Surgery  Why is it important to control my blood sugar before and after surgery? Improving blood sugar levels before and after surgery helps healing and can limit problems. A way of improving blood sugar control is eating a healthy diet by:  Eating less sugar and carbohydrates  Increasing activity/exercise  Talking with your doctor about reaching your blood sugar goals High blood sugars (greater than 180 mg/dL) can raise your risk of infections and slow your recovery, so you will need to focus on controlling your diabetes during the weeks before surgery. Make sure that the doctor who takes care of your diabetes knows about your planned surgery including the date and location.  How do I manage my blood sugar before surgery? Check your blood sugar at least 4 times a day, starting 2 days before surgery, to make sure that the level is not too high or low. Check your blood sugar the morning of your surgery when you wake up and every 2 hours until you get to the Short Stay unit. If your blood sugar is less than 70 mg/dL, you will need to treat for low blood sugar: Do not take insulin. Treat a low blood sugar (less than 70 mg/dL) with  cup of clear juice (cranberry or apple), 4 glucose tablets, OR glucose gel. Recheck blood sugar in 15 minutes after treatment (to make sure it is greater than 70 mg/dL). If your blood sugar is not greater than 70 mg/dL on recheck, call [phone] for further instructions. Report your blood sugar to the short stay nurse when you get to Short Stay.  If you are admitted to the hospital after surgery: Your blood sugar will be checked by the staff and you will probably be given insulin after surgery  (instead of oral diabetes medicines) to make sure you have good blood sugar levels. The goal for blood sugar control after surgery is 80-180 mg/dL.   WHAT DO I DO ABOUT MY DIABETES MEDICATION?  Do not take oral diabetes medicines (pills) the morning of surgery.  THE NIGHT BEFORE SURGERY, take     units of       insulin.       THE MORNING OF SURGERY, take  units of       insulin.  Reviewed and Endorsed by [facility] Patient Education Committee, August 2015    [facility] - Preparing for Surgery Before surgery, you can play an important role.  Because skin is not sterile, your skin needs to be as free of germs as possible.  You can reduce the number of germs on your skin by washing with CHG (chlorahexidine gluconate) soap before surgery.  CHG is an antiseptic cleaner which kills germs and bonds with the skin to continue killing germs even after washing. Please DO NOT use if you have an allergy to CHG or antibacterial soaps.  If your skin becomes reddened/irritated  stop using the CHG and inform your nurse when you arrive at Short Stay. Do not shave (including legs and underarms) for at least 48 hours prior to the first CHG shower.  You may shave your face/neck.  Please follow these instructions carefully:  1.  Shower with CHG Soap the night before surgery and the  morning of surgery.  2.  If you choose to wash your hair, wash your hair first as usual with your normal  shampoo.  3.  After you shampoo, rinse your hair and body thoroughly to remove the shampoo.                             4.  Use CHG as you would any other liquid soap.  You can apply chg directly to the skin and wash.  Gently with a scrungie or clean washcloth.  5.  Apply the CHG Soap to your body ONLY FROM THE NECK DOWN.   Do   not use on face/ open                           Wound or open sores. Avoid contact with eyes, ears mouth and   genitals (private parts).                       Wash face,  Genitals (private parts) with  your normal soap.             6.  Wash thoroughly, paying special attention to the area where your    surgery  will be performed.  7.  Thoroughly rinse your body with warm water from the neck down.  8.  DO NOT shower/wash with your normal soap after using and rinsing off the CHG Soap.                9.  Pat yourself dry with a clean towel.            10.  Wear clean pajamas.            11.  Place clean sheets on your bed the night of your first shower and do not  sleep with pets. Day of Surgery : Do not apply any lotions/deodorants the morning of surgery.  Please wear clean clothes to the hospital/surgery center.  FAILURE TO FOLLOW THESE INSTRUCTIONS MAY RESULT IN THE CANCELLATION OF YOUR SURGERY  PATIENT SIGNATURE_________________________________  NURSE SIGNATURE__________________________________  ________________________________________________________________________   WHAT IS A BLOOD TRANSFUSION? Blood Transfusion Information  A transfusion is the replacement of blood or some of its parts. Blood is made up of multiple cells which provide different functions. Red blood cells carry oxygen and are used for blood loss replacement. White blood cells fight against infection. Platelets control bleeding. Plasma helps clot blood. Other blood products are available for specialized needs, such as hemophilia or other clotting disorders. BEFORE THE TRANSFUSION  Who gives blood for transfusions?  Healthy volunteers who are fully evaluated to make sure their blood is safe. This is blood bank blood. Transfusion therapy is the safest it has ever been in the practice of medicine. Before blood is taken from a donor, a complete history is taken to make sure that person has no history of diseases nor engages in risky social behavior (examples are intravenous drug use or sexual activity with multiple partners). The donor's travel history is screened to minimize risk  of transmitting infections, such as  malaria. The donated blood is tested for signs of infectious diseases, such as HIV and hepatitis. The blood is then tested to be sure it is compatible with you in order to minimize the chance of a transfusion reaction. If you or a relative donates blood, this is often done in anticipation of surgery and is not appropriate for emergency situations. It takes many days to process the donated blood. RISKS AND COMPLICATIONS Although transfusion therapy is very safe and saves many lives, the main dangers of transfusion include:  Getting an infectious disease. Developing a transfusion reaction. This is an allergic reaction to something in the blood you were given. Every precaution is taken to prevent this. The decision to have a blood transfusion has been considered carefully by your caregiver before blood is given. Blood is not given unless the benefits outweigh the risks. AFTER THE TRANSFUSION Right after receiving a blood transfusion, you will usually feel much better and more energetic. This is especially true if your red blood cells have gotten low (anemic). The transfusion raises the level of the red blood cells which carry oxygen, and this usually causes an energy increase. The nurse administering the transfusion will monitor you carefully for complications. HOME CARE INSTRUCTIONS  No special instructions are needed after a transfusion. You may find your energy is better. Speak with your caregiver about any limitations on activity for underlying diseases you may have. SEEK MEDICAL CARE IF:  Your condition is not improving after your transfusion. You develop redness or irritation at the intravenous (IV) site. SEEK IMMEDIATE MEDICAL CARE IF:  Any of the following symptoms occur over the next 12 hours: Shaking chills. You have a temperature by mouth above 102 F (38.9 C), not controlled by medicine. Chest, back, or muscle pain. People around you feel you are not acting correctly or are  confused. Shortness of breath or difficulty breathing. Dizziness and fainting. You get a rash or develop hives. You have a decrease in urine output. Your urine turns a dark color or changes to pink, red, or brown. Any of the following symptoms occur over the next 10 days: You have a temperature by mouth above 102 F (38.9 C), not controlled by medicine. Shortness of breath. Weakness after normal activity. The white part of the eye turns yellow (jaundice). You have a decrease in the amount of urine or are urinating less often. Your urine turns a dark color or changes to pink, red, or brown. Document Released: 04/16/2000 Document Revised: 07/12/2011 Document Reviewed: 12/04/2007 [facility] Patient Information 2014 [place], [place].  _______________________________________________________________________

## 2020-12-17 ENCOUNTER — Other Ambulatory Visit: Payer: Self-pay

## 2020-12-17 ENCOUNTER — Encounter (HOSPITAL_COMMUNITY)
Admission: RE | Admit: 2020-12-17 | Discharge: 2020-12-17 | Disposition: A | Discharge status: Home / Self Care | Payer: Medicare Other | Source: Ambulatory Visit | Attending: Urology | Admitting: Urology

## 2020-12-17 ENCOUNTER — Other Ambulatory Visit: Payer: Self-pay | Admitting: Urology

## 2020-12-17 ENCOUNTER — Encounter (HOSPITAL_COMMUNITY): Payer: Self-pay

## 2020-12-17 DIAGNOSIS — E119 Type 2 diabetes mellitus without complications: Secondary | ICD-10-CM | POA: Insufficient documentation

## 2020-12-17 DIAGNOSIS — Z79899 Other long term (current) drug therapy: Secondary | ICD-10-CM | POA: Insufficient documentation

## 2020-12-17 DIAGNOSIS — Z794 Long term (current) use of insulin: Secondary | ICD-10-CM | POA: Insufficient documentation

## 2020-12-17 DIAGNOSIS — Z01812 Encounter for preprocedural laboratory examination: Secondary | ICD-10-CM | POA: Diagnosis not present

## 2020-12-17 DIAGNOSIS — I10 Essential (primary) hypertension: Secondary | ICD-10-CM | POA: Insufficient documentation

## 2020-12-17 DIAGNOSIS — Z7984 Long term (current) use of oral hypoglycemic drugs: Secondary | ICD-10-CM | POA: Insufficient documentation

## 2020-12-17 DIAGNOSIS — N200 Calculus of kidney: Secondary | ICD-10-CM | POA: Diagnosis not present

## 2020-12-17 DIAGNOSIS — I2510 Atherosclerotic heart disease of native coronary artery without angina pectoris: Secondary | ICD-10-CM | POA: Insufficient documentation

## 2020-12-17 DIAGNOSIS — Z7982 Long term (current) use of aspirin: Secondary | ICD-10-CM | POA: Insufficient documentation

## 2020-12-17 HISTORY — DX: Personal history of urinary calculi: Z87.442

## 2020-12-17 LAB — CBC
HCT: 46.5 % (ref 39.0–52.0)
Hemoglobin: 15.3 g/dL (ref 13.0–17.0)
MCH: 28.4 pg (ref 26.0–34.0)
MCHC: 32.9 g/dL (ref 30.0–36.0)
MCV: 86.3 fL (ref 80.0–100.0)
Platelets: 279 10*3/uL (ref 150–400)
RBC: 5.39 MIL/uL (ref 4.22–5.81)
RDW: 13.2 % (ref 11.5–15.5)
WBC: 13.9 10*3/uL — ABNORMAL HIGH (ref 4.0–10.5)
nRBC: 0 % (ref 0.0–0.2)

## 2020-12-17 LAB — BASIC METABOLIC PANEL
Anion gap: 10 (ref 5–15)
BUN: 21 mg/dL (ref 8–23)
CO2: 25 mmol/L (ref 22–32)
Calcium: 10.1 mg/dL (ref 8.9–10.3)
Chloride: 99 mmol/L (ref 98–111)
Creatinine, Ser: 0.95 mg/dL (ref 0.61–1.24)
GFR, Estimated: >60 mL/min (ref >60)
Glucose, Bld: 192 mg/dL — ABNORMAL HIGH (ref 70–99)
Potassium: 3.9 mmol/L (ref 3.5–5.1)
Sodium: 134 mmol/L — ABNORMAL LOW (ref 135–145)

## 2020-12-17 LAB — PROTIME-INR
INR: 0.9 (ref 0.8–1.2)
Prothrombin Time: 12.6 seconds (ref 11.4–15.2)

## 2020-12-17 LAB — GLUCOSE, CAPILLARY: Glucose-Capillary: 179 mg/dL — ABNORMAL HIGH (ref 70–99)

## 2020-12-17 LAB — TYPE AND SCREEN
ABO/RH(D): O POS
Antibody Screen: NEGATIVE

## 2020-12-17 LAB — HEMOGLOBIN A1C
Hgb A1c MFr Bld: 8 % — ABNORMAL HIGH (ref 4.8–5.6)
Mean Plasma Glucose: 182.9 mg/dL

## 2020-12-17 NOTE — Progress Notes (Signed)
  PCP - Dr [name] Cardiologist - Dr Ricardo Jericho last office visit note/epic 06/11/2020  EKG - 02/22/2020/epic Stress Test - 03/05/2020/epic ECHO - 02/27/2021  Sleep Study - 11/07/2019/epic pt uses APAP was instructed to bring tubing and breathing equipment day of surgery  Pt does not check cbgs on regular basis; states he does not have all equipment present and not up to day with strips; when did check it was 2 times per day with fasting sugars ranging between 135-160  Activity level:  Can go up a flight of stairs and activities of daily living without stopping and without symptoms       Anesthesia review:   Patient denies shortness of breath, fever, cough and chest pain at PAT appointment   Patient verbalized understanding of instructions that were given to them at the PAT appointment. Patient was also instructed that they will need to review over the PAT instructions again at home before surgery.

## 2020-12-17 NOTE — Patient Instructions (Addendum)
DUE TO COVID-19 ONLY ONE VISITOR IS ALLOWED TO COME WITH YOU AND STAY IN THE WAITING ROOM ONLY DURING PRE OP AND PROCEDURE DAY OF SURGERY. THE 1 VISITOR  MAY VISIT WITH YOU AFTER SURGERY IN YOUR PRIVATE ROOM DURING VISITING HOURS ONLY!  ONCE YOUR COVID TEST IS COMPLETED,  PLEASE BEGIN THE QUARANTINE INSTRUCTIONS AS OUTLINED IN YOUR HANDOUT.                Steven SalmonRobert K Contreras  12/17/2020   Your procedure is scheduled on: Monday December 29, 2020   [facility]   Report to admitting at 0730AM     Call this number if you have problems the morning of surgery [phone]    Remember: Do not eat food After Midnight. Clear Liquids from Midnight till 0600 then nothing by mouth.  BRUSH YOUR TEETH MORNING OF SURGERY AND RINSE YOUR MOUTH OUT, NO CHEWING GUM CANDY OR MINTS.     Take these medicines the morning of surgery with A SIP OF WATER: Amlodipine, Nebivolol, Rosuvastatin   DO NOT TAKE ANY DIABETIC MEDICATIONS DAY OF YOUR SURGERY                               You may not have any metal on your body including hair pins and              piercings  Do not wear jewelry, lotions, powders or colognes, deodorant                    Men may shave face and neck.   Do not bring valuables to the hospital. [facility] IS NOT             RESPONSIBLE   FOR VALUABLES.  Contacts, dentures or bridgework may not be worn into surgery.  Leave suitcase in the car. After surgery it may be brought to your room.     Patients discharged the day of surgery will not be allowed to drive home. IF YOU ARE HAVING SURGERY AND GOING HOME THE SAME DAY, YOU MUST HAVE AN ADULT TO DRIVE YOU HOME AND BE WITH YOU FOR 24 HOURS. YOU MAY GO HOME BY TAXI OR UBER OR ORTHERWISE, BUT AN ADULT MUST ACCOMPANY YOU HOME AND STAY WITH YOU FOR 24 HOURS.              Please read over the following fact sheets you were given: _____________________________________________________________________             How  to Manage Your Diabetes Before and After Surgery  Why is it important to control my blood sugar before and after surgery? Improving blood sugar levels before and after surgery helps healing and can limit problems. A way of improving blood sugar control is eating a healthy diet by:  Eating less sugar and carbohydrates  Increasing activity/exercise  Talking with your doctor about reaching your blood sugar goals High blood sugars (greater than 180 mg/dL) can raise your risk of infections and slow your recovery, so you will need to focus on controlling your diabetes during the weeks before surgery. Make sure that the doctor who takes care of your diabetes knows about your planned surgery including the date and location.  How do I manage my blood sugar before surgery? Check your blood sugar at least 4 times a day, starting 2 days before surgery, to make sure  that the level is not too high or low. Check your blood sugar the morning of your surgery when you wake up and every 2 hours until you get to the Short Stay unit. If your blood sugar is less than 70 mg/dL, you will need to treat for low blood sugar: Do not take insulin. Treat a low blood sugar (less than 70 mg/dL) with  cup of clear juice (cranberry or apple), 4 glucose tablets, OR glucose gel. Recheck blood sugar in 15 minutes after treatment (to make sure it is greater than 70 mg/dL). If your blood sugar is not greater than 70 mg/dL on recheck, call [phone] for further instructions. Report your blood sugar to the short stay nurse when you get to Short Stay.  If you are admitted to the hospital after surgery: Your blood sugar will be checked by the staff and you will probably be given insulin after surgery (instead of oral diabetes medicines) to make sure you have good blood sugar levels. The goal for blood sugar control after surgery is 80-180 mg/dL.   WHAT DO I DO ABOUT MY DIABETES MEDICATION?  Do not take oral diabetes medicines  (pills) the morning of surgery.   The day of surgery, do not take other diabetes injectables, including Byetta (exenatide), Bydureon (exenatide ER), Victoza (liraglutide), or Trulicity (dulaglutide)..     Patient Signature:  Date:   Nurse Signature:  Date:   Reviewed and Endorsed by [facility] Patient Education Committee, August 2015    CLEAR LIQUID DIET   Foods Allowed                                                                     Foods Excluded  Coffee and tea, regular and decaf                             liquids that you cannot  Plain Jell-O any favor except red or purple                                           see through such as: Fruit ices (not with fruit pulp)                                     milk, soups, orange juice  Iced Popsicles                                    All solid food Carbonated beverages, regular and diet                                    Cranberry, grape and apple juices Sports drinks like Gatorade Lightly seasoned clear broth or consume(fat free) Sugar, honey syrup  Sample Menu Breakfast  Lunch                                     Supper Cranberry juice                    Beef broth                            Chicken broth Jell-O                                     Grape juice                           Apple juice Coffee or tea                        Jell-O                                      Popsicle                                                Coffee or tea                        Coffee or tea  _____________________________________________________________________   WHAT IS A BLOOD TRANSFUSION? Blood Transfusion Information  A transfusion is the replacement of blood or some of its parts. Blood is made up of multiple cells which provide different functions. Red blood cells carry oxygen and are used for blood loss replacement. White blood cells fight against infection. Platelets control bleeding. Plasma  helps clot blood. Other blood products are available for specialized needs, such as hemophilia or other clotting disorders. BEFORE THE TRANSFUSION  Who gives blood for transfusions?  Healthy volunteers who are fully evaluated to make sure their blood is safe. This is blood bank blood. Transfusion therapy is the safest it has ever been in the practice of medicine. Before blood is taken from a donor, a complete history is taken to make sure that person has no history of diseases nor engages in risky social behavior (examples are intravenous drug use or sexual activity with multiple partners). The donor's travel history is screened to minimize risk of transmitting infections, such as malaria. The donated blood is tested for signs of infectious diseases, such as HIV and hepatitis. The blood is then tested to be sure it is compatible with you in order to minimize the chance of a transfusion reaction. If you or a relative donates blood, this is often done in anticipation of surgery and is not appropriate for emergency situations. It takes many days to process the donated blood. RISKS AND COMPLICATIONS Although transfusion therapy is very safe and saves many lives, the main dangers of transfusion include:  Getting an infectious disease. Developing a transfusion reaction. This is an allergic reaction to something in the blood you were given. Every precaution is taken to prevent this. The decision to have a blood transfusion has been considered carefully by  your caregiver before blood is given. Blood is not given unless the benefits outweigh the risks. AFTER THE TRANSFUSION Right after receiving a blood transfusion, you will usually feel much better and more energetic. This is especially true if your red blood cells have gotten low (anemic). The transfusion raises the level of the red blood cells which carry oxygen, and this usually causes an energy increase. The nurse administering the transfusion will monitor  you carefully for complications. HOME CARE INSTRUCTIONS  No special instructions are needed after a transfusion. You may find your energy is better. Speak with your caregiver about any limitations on activity for underlying diseases you may have. SEEK MEDICAL CARE IF:  Your condition is not improving after your transfusion. You develop redness or irritation at the intravenous (IV) site. SEEK IMMEDIATE MEDICAL CARE IF:  Any of the following symptoms occur over the next 12 hours: Shaking chills. You have a temperature by mouth above 102 F (38.9 C), not controlled by medicine. Chest, back, or muscle pain. People around you feel you are not acting correctly or are confused. Shortness of breath or difficulty breathing. Dizziness and fainting. You get a rash or develop hives. You have a decrease in urine output. Your urine turns a dark color or changes to pink, red, or brown. Any of the following symptoms occur over the next 10 days: You have a temperature by mouth above 102 F (38.9 C), not controlled by medicine. Shortness of breath. Weakness after normal activity. The white part of the eye turns yellow (jaundice). You have a decrease in the amount of urine or are urinating less often. Your urine turns a dark color or changes to pink, red, or brown. Document Released: 04/16/2000 Document Revised: 07/12/2011 Document Reviewed: 12/04/2007 ExitCare Patient Information 2014 [employer].  _______________________________________________________________________ [facility] - Preparing for Surgery Before surgery, you can play an important role.  Because skin is not sterile, your skin needs to be as free of germs as possible.  You can reduce the number of germs on your skin by washing with CHG (chlorahexidine gluconate) soap before surgery.  CHG is an antiseptic cleaner which kills germs and bonds with the skin to continue killing germs even after washing. Please DO NOT use if you have an  allergy to CHG or antibacterial soaps.  If your skin becomes reddened/irritated stop using the CHG and inform your nurse when you arrive at Short Stay. Do not shave (including legs and underarms) for at least 48 hours prior to the first CHG shower.  You may shave your face/neck. Please follow these instructions carefully:  1.  Shower with CHG Soap the night before surgery and the  morning of Surgery.  2.  If you choose to wash your hair, wash your hair first as usual with your  normal  shampoo.  3.  After you shampoo, rinse your hair and body thoroughly to remove the  shampoo.                           4.  Use CHG as you would any other liquid soap.  You can apply chg directly  to the skin and wash                       Gently with a scrungie or clean washcloth.  5.  Apply the CHG Soap to your body ONLY FROM THE NECK DOWN.   Do not use on  face/ open                           Wound or open sores. Avoid contact with eyes, ears mouth and genitals (private parts).                       Wash face,  Genitals (private parts) with your normal soap.             6.  Wash thoroughly, paying special attention to the area where your surgery  will be performed.  7.  Thoroughly rinse your body with warm water from the neck down.  8.  DO NOT shower/wash with your normal soap after using and rinsing off  the CHG Soap.                9.  Pat yourself dry with a clean towel.            10.  Wear clean pajamas.            11.  Place clean sheets on your bed the night of your first shower and do not  sleep with pets. Day of Surgery : Do not apply any lotions/deodorants the morning of surgery.  Please wear clean clothes to the hospital/surgery center.  FAILURE TO FOLLOW THESE INSTRUCTIONS MAY RESULT IN THE CANCELLATION OF YOUR SURGERY PATIENT SIGNATURE_________________________________  NURSE SIGNATURE__________________________________  ________________________________________________________________________

## 2020-12-17 NOTE — Addendum Note (Signed)
Addended by: Ethlyn Daniels on: 12/17/2020 10:41 AM   Modules accepted: Orders

## 2020-12-18 ENCOUNTER — Ambulatory Visit
Admission: RE | Admit: 2020-12-18 | Discharge: 2020-12-18 | Disposition: A | Discharge status: Home / Self Care | Payer: Medicare Other | Source: Ambulatory Visit | Attending: Family Medicine | Admitting: Family Medicine

## 2020-12-18 ENCOUNTER — Encounter: Payer: Self-pay | Admitting: Family Medicine

## 2020-12-18 DIAGNOSIS — N200 Calculus of kidney: Secondary | ICD-10-CM

## 2020-12-18 DIAGNOSIS — R109 Unspecified abdominal pain: Secondary | ICD-10-CM

## 2020-12-18 NOTE — Anesthesia Preprocedure Evaluation (Addendum)
Anesthesia Evaluation  Patient identified by MRN, date of birth, ID band Patient awake    Reviewed: Allergy & Precautions, NPO status , Patient's Chart, lab work & pertinent test results  Airway Mallampati: II  TM Distance: >3 FB Neck ROM: Full    Dental no notable dental hx.    Pulmonary neg pulmonary ROS, former smoker,    Pulmonary exam normal breath sounds clear to auscultation       Cardiovascular hypertension, Pt. on medications and Pt. on home beta blockers Normal cardiovascular exam Rhythm:Regular Rate:Normal     Neuro/Psych negative neurological ROS  negative psych ROS   GI/Hepatic negative GI ROS, Neg liver ROS,   Endo/Other  diabetes, Type 2  Renal/GU negative Renal ROS  negative genitourinary   Musculoskeletal negative musculoskeletal ROS (+)   Abdominal   Peds negative pediatric ROS (+)  Hematology negative hematology ROS (+)   Anesthesia Other Findings   Reproductive/Obstetrics negative OB ROS                            Anesthesia Physical Anesthesia Plan  ASA: 2  Anesthesia Plan: General   Post-op Pain Management:    Induction: Intravenous  PONV Risk Score and Plan: 2 and Ondansetron and Dexamethasone  Airway Management Planned: Oral ETT  Additional Equipment:   Intra-op Plan:   Post-operative Plan: Extubation in OR  Informed Consent: I have reviewed the patients History and Physical, chart, labs and discussed the procedure including the risks, benefits and alternatives for the proposed anesthesia with the patient or authorized representative who has indicated his/her understanding and acceptance.     Dental advisory given  Plan Discussed with: CRNA and Surgeon  Anesthesia Plan Comments: (See APP note by Joslyn Hy, FNP )       Anesthesia Quick Evaluation

## 2020-12-18 NOTE — Progress Notes (Addendum)
Anesthesia Chart Review:   Case: [id number] Date/Time: 12/29/20 1145   Procedure: LEFT NEPHROLITHOTOMY PERCUTANEOUS (Left)   Anesthesia type: General   Pre-op diagnosis: LEFT RENAL STONES   Location: [facility] ROOM 08 / [facility]   Surgeons: Crista Elliot, MD       DISCUSSION: Pt is 65 years old with hx HTN, DM.   Pt had likely normal stress test in 2021 but elevated cardiac calcium score; Dr. Rosemary Holms suspects CAD and is treating for it.    VS: BP (!) 144/78   Pulse 75   Temp 36.9 C (Oral)   Resp 20   Ht 5\' 8"  (1.727 m)   Wt 113.5 kg   SpO2 99%   BMI 38.04 kg/m   PROVIDERS: - PCP is , MD - Cardiologist is [name], MD. Last office visit 06/11/20  LABS: Labs reviewed: Acceptable for surgery. (all labs ordered are listed, but only abnormal results are displayed)  Labs Reviewed  HEMOGLOBIN A1C - Abnormal; Notable for the following components:      Result Value   Hgb A1c MFr Bld 8.0 (*)    All other components within normal limits  CBC - Abnormal; Notable for the following components:   WBC 13.9 (*)    All other components within normal limits  BASIC METABOLIC PANEL - Abnormal; Notable for the following components:   Sodium 134 (*)    Glucose, Bld 192 (*)    All other components within normal limits  GLUCOSE, CAPILLARY - Abnormal; Notable for the following components:   Glucose-Capillary 179 (*)    All other components within normal limits  PROTIME-INR  TYPE AND SCREEN     IMAGES: Renal 08/09/20 12/10/20:  - Obstructing 1.6 cm stone at the ureteropelvic junction with upstream hydronephrosis of the left kidney. - Multiple cystic lesions bilaterally, some of which appear complex. - Recommend renal protocol multiphase CT for further evaluation.   EKG 02/22/20:  Sinus rhythm 67 bpm Left axis deviation Poor R wave progression  CV: Nuclear stress test 03/05/20:  There is a  moderate defect in the lateral, inferior and apical regions, due to  prominent gut uptake artifact possibly large hiatal hernia noted in the inferolateral wall. In the absence of wall motion abnormality and normal LVEF, ischemia is less likely. Overall LV systolic function is normal without regional wall motion abnormalities. Stress LV EF: 62%.  No previous exam available for comparison. Low risk. Clinical correlation recommended.  CT cardiac calcium scoring 02/28/20 (care everywhere):  IMPRESSION: The total coronary calcium score is 287 which is between the 50th and 75th percentile for a male patient this age  Echo 02/25/21:  - Left ventricle cavity is normal in size and wall thickness. - Normal global wall motion. Normal LV systolic function with EF 65%. Normal diastolic filling pattern.  - No significant valvular abnormality.  - normal right atrial pressure  Past Medical History:  Diagnosis Date   ADD (attention deficit disorder)    Anxiety    hx of   Arthritis    Diabetes (HCC)    Eczema    Family history of adverse reaction to anesthesia    mother slow to awaken   Headache    High cholesterol    History of kidney stones    Hypertension    Right rotator cuff tear     Past Surgical History:  Procedure Laterality Date   colonscopy  2014   SHOULDER SURGERY Left years ago  rotator, bicep and clavicle repair.   TOTAL HIP ARTHROPLASTY Right 10/08/2014   Procedure: RIGHT TOTAL HIP ARTHROPLASTY ANTERIOR APPROACH;  Surgeon: Durene Romans, MD;  Location: [facility];  Service: Orthopedics;  Laterality: Right;    MEDICATIONS:  amLODipine (NORVASC) 10 MG tablet   aspirin EC 81 MG tablet   Cholecalciferol (DIALYVITE VITAMIN D 5000) 125 MCG (5000 UT) capsule   citalopram (CELEXA) 40 MG tablet   Colloidal Oatmeal (GOLD BOND ECZEMA RELIEF) 2 % CREA   etodolac (LODINE) 400 MG tablet   furosemide (LASIX) 40 MG tablet   gabapentin (NEURONTIN) 100 MG capsule   insulin glargine (LANTUS SOLOSTAR) 100 UNIT/ML Solostar Pen   loratadine (CLARITIN) 10 MG tablet    losartan (COZAAR) 50 MG tablet   metFORMIN (GLUCOPHAGE) 500 MG tablet   nebivolol (BYSTOLIC) 5 MG tablet   polyvinyl alcohol (LIQUIFILM TEARS) 1.4 % ophthalmic solution   potassium gluconate 595 (99 K) MG TABS tablet   predniSONE (DELTASONE) 20 MG tablet   rosuvastatin (CRESTOR) 20 MG tablet   zolpidem (AMBIEN) 10 MG tablet   No current facility-administered medications for this encounter.    If no changes, I anticipate pt can proceed with surgery as scheduled.   Rica Mast, PhD, FNP-BC [facility]/Anesthesiology Phone: [phone] 12/18/2020 12:15 PM

## 2020-12-20 ENCOUNTER — Encounter: Payer: Self-pay | Admitting: Family Medicine

## 2020-12-24 ENCOUNTER — Other Ambulatory Visit: Payer: Self-pay

## 2020-12-24 ENCOUNTER — Ambulatory Visit (INDEPENDENT_AMBULATORY_CARE_PROVIDER_SITE_OTHER): Payer: Medicare Other | Admitting: Family Medicine

## 2020-12-24 DIAGNOSIS — R931 Abnormal findings on diagnostic imaging of heart and coronary circulation: Secondary | ICD-10-CM | POA: Diagnosis not present

## 2020-12-24 DIAGNOSIS — M5416 Radiculopathy, lumbar region: Secondary | ICD-10-CM

## 2020-12-24 NOTE — Patient Instructions (Addendum)
Good to see you  Will be thinking of you on Monday Let us know how you are doing in about a week or so  Happy [place] Ouch  See me again in 4 weeks

## 2020-12-24 NOTE — Assessment & Plan Note (Signed)
Patient does have some mild numbness and tingling in the thigh at this point.  Patient does back pain is completely improved at the moment.  Patient is undergoing removal of a kidney stone next week.  Do believe that it is a potential of the contributing to some of the discomfort and pain he was having.  Patient has been doing well though with the patient.  Did have some elevation in the blood pressure and blood glucose.  Follow-up again in 4 to 6 weeks

## 2020-12-24 NOTE — Progress Notes (Signed)
[name] [address] [place] [address] Phone: [phone] Subjective:    I'm seeing this patient by the request  of:  [name], MD  CC: Low back pain follow-up  WEX:[id number]  12/03/2020 Patient is having signs and symptoms consistent with more of a lumbar radiculopathy.  Concern for possible L2-L3 area.  We will get x-rays to rule out anything such as a compression fracture.  Toradol and Depo-Medrol given today.  We will do a short course of prednisone at 20 mg.  Patient has done better with his blood sugar so we will monitor closely.  In addition to this patient is very motivated to lose weight and we will send patient to healthy weight and wellness.  BMI is 38.  Patient was started on gabapentin to help him with some of the neurologic component of this.  If worsening pain advanced imaging may be warranted.  Follow-up with me again in 3 weeks.  Update 12/24/2020 Luz Burcher Ferrin is a 65 y.o. male coming in with complaint of back pain. Patient states that the back feels fine but the L lateral thigh pain that feels like the skin is on fire half the time that will be painful to the touch. States the pain is constant but the degree of the pain will be different, pain is worse at night.   Patient was found to have a kidney stone.  Patient is scheduled to have a removal on Monday.  Patient denies any fevers or chills at the moment.     Past Medical History:  Diagnosis Date   ADD (attention deficit disorder)    Anxiety    hx of   Arthritis    Diabetes (HCC)    Eczema    Family history of adverse reaction to anesthesia    mother slow to awaken   Headache    High cholesterol    History of kidney stones    Hypertension    Right rotator cuff tear    Past Surgical History:  Procedure Laterality Date   colonscopy  2014   SHOULDER SURGERY Left years ago   rotator, bicep and clavicle repair.   TOTAL HIP ARTHROPLASTY Right 10/08/2014    Procedure: RIGHT TOTAL HIP ARTHROPLASTY ANTERIOR APPROACH;  Surgeon: Durene Romans, MD;  Location: [facility];  Service: Orthopedics;  Laterality: Right;   Social History   Socioeconomic History   Marital status: Married    Spouse name: Not on file   Number of children: 3   Years of education: Not on file   Highest education level: Not on file  Occupational History   Not on file  Tobacco Use   Smoking status: Former    Packs/day: 0.50    Years: 15.00    Pack years: 7.50    Types: Cigarettes    Quit date: 05/03/2006    Years since quitting: 14.6   Smokeless tobacco: Never  Vaping Use   Vaping Use: Former  Substance and Sexual Activity   Alcohol use: Yes    Comment: rarely   Drug use: Never   Sexual activity: Not on file  Other Topics Concern   Not on file  Social History Narrative   Not on file   Social Determinants of Health   Financial Resource Strain: Not on file  Food Insecurity: Not on file  Transportation Needs: Not on file  Physical Activity: Not on file  Stress: Not on file  Social Connections: Not on file  Allergies  Allergen Reactions   Ace Inhibitors Cough   Ampicillin     NOT sure if its ampicillin or amoxicillin---stomach cramps   Bupropion Other (See Comments)    Unknown reaction   Venlafaxine Other (See Comments)    Unknown reaction    Family History  Problem Relation Age of Onset   Pancreatic cancer Mother    Prostate cancer Father    Allergic rhinitis Sister     Current Outpatient Medications (Endocrine & Metabolic):    insulin glargine (LANTUS SOLOSTAR) 100 UNIT/ML Solostar Pen, Inject 20 Units into the skin daily.   metFORMIN (GLUCOPHAGE) 500 MG tablet, Take 1,000 mg by mouth 2 (two) times daily.   predniSONE (DELTASONE) 20 MG tablet, Take 1 tablet (20 mg total) by mouth daily with breakfast.  Current Outpatient Medications (Cardiovascular):    amLODipine (NORVASC) 10 MG tablet, Take 10 mg by mouth daily.   furosemide (LASIX) 40 MG tablet,  Take 40 mg by mouth daily.   nebivolol (BYSTOLIC) 5 MG tablet, Take 2 tablets (10 mg total) by mouth daily. (Patient taking differently: Take 5 mg by mouth daily.)   losartan (COZAAR) 50 MG tablet, Take 1 tablet (50 mg total) by mouth daily. (Patient not taking: No sig reported)   rosuvastatin (CRESTOR) 20 MG tablet, Take 1 tablet (20 mg total) by mouth daily.  Current Outpatient Medications (Respiratory):    loratadine (CLARITIN) 10 MG tablet, Take 10 mg by mouth daily as needed for allergies.  Current Outpatient Medications (Analgesics):    aspirin EC 81 MG tablet, Take 81 mg by mouth daily. Swallow whole.   etodolac (LODINE) 400 MG tablet, Take 400 mg by mouth 2 (two) times daily.   Current Outpatient Medications (Other):    Cholecalciferol (DIALYVITE VITAMIN D 5000) 125 MCG (5000 UT) capsule, Take 10,000 Units by mouth every evening.   citalopram (CELEXA) 40 MG tablet, Take 40 mg by mouth daily.   Colloidal Oatmeal (GOLD BOND ECZEMA RELIEF) 2 % CREA, Apply 1 application topically daily as needed (eczema).   gabapentin (NEURONTIN) 100 MG capsule, Take two capsules (total 200mg ) daily at bedtime   polyvinyl alcohol (LIQUIFILM TEARS) 1.4 % ophthalmic solution, Place 1 drop into both eyes 4 (four) times daily as needed for dry eyes.   potassium gluconate 595 (99 K) MG TABS tablet, Take 595 mg by mouth daily.   zolpidem (AMBIEN) 10 MG tablet, Take 10 mg by mouth at bedtime as needed for sleep.   Reviewed prior external information including notes and imaging from  primary care provider As well as notes that were available from care everywhere and other healthcare systems.  Past medical history, social, surgical and family history all reviewed in electronic medical record.  No pertanent information unless stated regarding to the chief complaint.   Review of Systems:  No headache, visual changes, nausea, vomiting, diarrhea, constipation, dizziness, abdominal pain, skin rash, fevers,  chills, night sweats, weight loss, swollen lymph nodes, body aches, joint swelling, chest pain, shortness of breath, mood changes. POSITIVE muscle aches but improved   Objective  Blood pressure 132/90, pulse 72, height 5\' 8"  (1.727 m), weight 248 lb (112.5 kg), SpO2 98 %.   General: No apparent distress alert and oriented x3 mood and affect normal, dressed appropriately.  HEENT: Pupils equal, extraocular movements intact  Respiratory: Patient's speak in full sentences and does not appear short of breath  Cardiovascular: No lower extremity edema, non tender, no erythema  Gait normal with good balance and  coordination.  MSK: Low back exam does have some mild loss of lordosis.  Patient has tightness with Pearlean Brownie but negative straight leg test.  Patient's left thigh is minorly tender to palpation but no masses appreciated.  No skin type of rash.    Impression and Recommendations:     The above documentation has been reviewed and is accurate and complete Judi Saa, DO

## 2020-12-25 ENCOUNTER — Other Ambulatory Visit: Payer: Self-pay | Admitting: Urology

## 2020-12-25 LAB — SARS CORONAVIRUS 2 (TAT 6-24 HRS): SARS Coronavirus 2: NEGATIVE

## 2020-12-26 ENCOUNTER — Other Ambulatory Visit: Payer: Self-pay | Admitting: Radiology

## 2020-12-29 ENCOUNTER — Other Ambulatory Visit: Payer: Self-pay

## 2020-12-29 ENCOUNTER — Observation Stay (HOSPITAL_COMMUNITY)
Admission: RE | Admit: 2020-12-29 | Discharge: 2020-12-30 | Disposition: A | Discharge status: Home / Self Care | Payer: Medicare Other | Attending: Urology | Admitting: Urology

## 2020-12-29 ENCOUNTER — Ambulatory Visit (HOSPITAL_COMMUNITY): Payer: Medicare Other | Admitting: Certified Registered Nurse Anesthetist

## 2020-12-29 ENCOUNTER — Ambulatory Visit (HOSPITAL_COMMUNITY)
Admission: RE | Admit: 2020-12-29 | Discharge: 2020-12-29 | Disposition: A | Discharge status: Home / Self Care | Payer: Medicare Other | Source: Ambulatory Visit | Attending: Urology | Admitting: Urology

## 2020-12-29 ENCOUNTER — Encounter (HOSPITAL_COMMUNITY)
Admission: RE | Disposition: A | Discharge status: Home / Self Care | Payer: Self-pay | Source: Home / Self Care | Attending: Urology

## 2020-12-29 ENCOUNTER — Ambulatory Visit (HOSPITAL_COMMUNITY): Payer: Medicare Other | Admitting: Emergency Medicine

## 2020-12-29 ENCOUNTER — Encounter (HOSPITAL_COMMUNITY): Payer: Self-pay | Admitting: Urology

## 2020-12-29 ENCOUNTER — Ambulatory Visit (HOSPITAL_COMMUNITY): Payer: Medicare Other

## 2020-12-29 DIAGNOSIS — Z87891 Personal history of nicotine dependence: Secondary | ICD-10-CM | POA: Insufficient documentation

## 2020-12-29 DIAGNOSIS — Z7984 Long term (current) use of oral hypoglycemic drugs: Secondary | ICD-10-CM | POA: Insufficient documentation

## 2020-12-29 DIAGNOSIS — I2510 Atherosclerotic heart disease of native coronary artery without angina pectoris: Secondary | ICD-10-CM | POA: Diagnosis not present

## 2020-12-29 DIAGNOSIS — Z96641 Presence of right artificial hip joint: Secondary | ICD-10-CM | POA: Insufficient documentation

## 2020-12-29 DIAGNOSIS — Z794 Long term (current) use of insulin: Secondary | ICD-10-CM | POA: Insufficient documentation

## 2020-12-29 DIAGNOSIS — Z79899 Other long term (current) drug therapy: Secondary | ICD-10-CM | POA: Insufficient documentation

## 2020-12-29 DIAGNOSIS — E119 Type 2 diabetes mellitus without complications: Secondary | ICD-10-CM | POA: Insufficient documentation

## 2020-12-29 DIAGNOSIS — I10 Essential (primary) hypertension: Secondary | ICD-10-CM | POA: Insufficient documentation

## 2020-12-29 DIAGNOSIS — Z888 Allergy status to other drugs, medicaments and biological substances status: Secondary | ICD-10-CM | POA: Insufficient documentation

## 2020-12-29 DIAGNOSIS — E785 Hyperlipidemia, unspecified: Secondary | ICD-10-CM | POA: Insufficient documentation

## 2020-12-29 DIAGNOSIS — Z881 Allergy status to other antibiotic agents status: Secondary | ICD-10-CM | POA: Insufficient documentation

## 2020-12-29 DIAGNOSIS — I119 Hypertensive heart disease without heart failure: Secondary | ICD-10-CM | POA: Insufficient documentation

## 2020-12-29 DIAGNOSIS — N132 Hydronephrosis with renal and ureteral calculous obstruction: Principal | ICD-10-CM | POA: Insufficient documentation

## 2020-12-29 DIAGNOSIS — N201 Calculus of ureter: Secondary | ICD-10-CM | POA: Diagnosis present

## 2020-12-29 DIAGNOSIS — Z7982 Long term (current) use of aspirin: Secondary | ICD-10-CM | POA: Insufficient documentation

## 2020-12-29 DIAGNOSIS — N200 Calculus of kidney: Secondary | ICD-10-CM

## 2020-12-29 HISTORY — PX: NEPHROLITHOTOMY: SHX5134

## 2020-12-29 HISTORY — PX: IR URETERAL STENT LEFT NEW ACCESS W/O SEP NEPHROSTOMY CATH: IMG6075

## 2020-12-29 LAB — CBC
HCT: 44.7 % (ref 39.0–52.0)
Hemoglobin: 15.3 g/dL (ref 13.0–17.0)
MCH: 28.9 pg (ref 26.0–34.0)
MCHC: 34.2 g/dL (ref 30.0–36.0)
MCV: 84.3 fL (ref 80.0–100.0)
Platelets: 223 10*3/uL (ref 150–400)
RBC: 5.3 MIL/uL (ref 4.22–5.81)
RDW: 13.1 % (ref 11.5–15.5)
WBC: 8.5 10*3/uL (ref 4.0–10.5)
nRBC: 0 % (ref 0.0–0.2)

## 2020-12-29 LAB — BASIC METABOLIC PANEL
Anion gap: 7 (ref 5–15)
BUN: 20 mg/dL (ref 8–23)
CO2: 30 mmol/L (ref 22–32)
Calcium: 9.7 mg/dL (ref 8.9–10.3)
Chloride: 104 mmol/L (ref 98–111)
Creatinine, Ser: 1.04 mg/dL (ref 0.61–1.24)
GFR, Estimated: >60 mL/min (ref >60)
Glucose, Bld: 179 mg/dL — ABNORMAL HIGH (ref 70–99)
Potassium: 4.7 mmol/L (ref 3.5–5.1)
Sodium: 141 mmol/L (ref 135–145)

## 2020-12-29 LAB — GLUCOSE, CAPILLARY
Glucose-Capillary: 178 mg/dL — ABNORMAL HIGH (ref 70–99)
Glucose-Capillary: 189 mg/dL — ABNORMAL HIGH (ref 70–99)
Glucose-Capillary: 198 mg/dL — ABNORMAL HIGH (ref 70–99)
Glucose-Capillary: 232 mg/dL — ABNORMAL HIGH (ref 70–99)

## 2020-12-29 LAB — PROTIME-INR
INR: 0.9 (ref 0.8–1.2)
Prothrombin Time: 12 seconds (ref 11.4–15.2)

## 2020-12-29 LAB — HEMOGLOBIN AND HEMATOCRIT, BLOOD
HCT: 44.4 % (ref 39.0–52.0)
Hemoglobin: 14.4 g/dL (ref 13.0–17.0)

## 2020-12-29 SURGERY — NEPHROLITHOTOMY PERCUTANEOUS
Anesthesia: General | Laterality: Left

## 2020-12-29 MED ORDER — FENTANYL CITRATE (PF) 100 MCG/2ML IJ SOLN
INTRAMUSCULAR | Status: DC | PRN
  Administered 2020-12-29: 50 ug via INTRAVENOUS
  Administered 2020-12-29: 75 ug via INTRAVENOUS
  Administered 2020-12-29: 50 ug via INTRAVENOUS

## 2020-12-29 MED ORDER — FENTANYL CITRATE (PF) 250 MCG/5ML IJ SOLN
INTRAMUSCULAR | Status: AC
  Filled 2020-12-29: qty 5

## 2020-12-29 MED ORDER — 0.9 % SODIUM CHLORIDE (POUR BTL) OPTIME
TOPICAL | Status: DC | PRN
  Administered 2020-12-29: 1000 mL

## 2020-12-29 MED ORDER — PROPOFOL 10 MG/ML IV BOLUS
INTRAVENOUS | Status: DC | PRN
  Administered 2020-12-29: 140 mg via INTRAVENOUS

## 2020-12-29 MED ORDER — COLLOIDAL OATMEAL 2 % EX CREA: 1.0000 "application " | TOPICAL_CREAM | Freq: Every day | CUTANEOUS | Status: DC | PRN

## 2020-12-29 MED ORDER — ETODOLAC 200 MG PO CAPS
400.0000 mg | ORAL_CAPSULE | Freq: Two times a day (BID) | ORAL | Status: DC
  Administered 2020-12-29 – 2020-12-30 (×2): 400 mg via ORAL
  Filled 2020-12-29 (×2): qty 2

## 2020-12-29 MED ORDER — ONDANSETRON HCL 4 MG/2ML IJ SOLN
INTRAMUSCULAR | Status: DC | PRN
  Administered 2020-12-29: 4 mg via INTRAVENOUS

## 2020-12-29 MED ORDER — CHLORHEXIDINE GLUCONATE 0.12 % MT SOLN
15.0000 mL | Freq: Once | OROMUCOSAL | Status: AC
  Administered 2020-12-29: 15 mL via OROMUCOSAL

## 2020-12-29 MED ORDER — CIPROFLOXACIN IN D5W 400 MG/200ML IV SOLN
400.0000 mg | Freq: Two times a day (BID) | INTRAVENOUS | Status: DC
  Filled 2020-12-29: qty 200

## 2020-12-29 MED ORDER — ZOLPIDEM TARTRATE 5 MG PO TABS: 5.0000 mg | ORAL_TABLET | Freq: Every evening | ORAL | Status: DC | PRN

## 2020-12-29 MED ORDER — DEXAMETHASONE SODIUM PHOSPHATE 10 MG/ML IJ SOLN
INTRAMUSCULAR | Status: AC
  Filled 2020-12-29: qty 1

## 2020-12-29 MED ORDER — SODIUM CHLORIDE 0.9 % IR SOLN
Status: DC | PRN
  Administered 2020-12-29: 18000 mL

## 2020-12-29 MED ORDER — OXYCODONE HCL 5 MG PO TABS: 5.0000 mg | ORAL_TABLET | Freq: Once | ORAL | Status: DC | PRN

## 2020-12-29 MED ORDER — BELLADONNA ALKALOIDS-OPIUM 16.2-60 MG RE SUPP: 1.0000 | Freq: Four times a day (QID) | RECTAL | Status: DC | PRN

## 2020-12-29 MED ORDER — PROMETHAZINE HCL 25 MG/ML IJ SOLN: 6.2500 mg | INTRAMUSCULAR | Status: DC | PRN

## 2020-12-29 MED ORDER — AMLODIPINE BESYLATE 10 MG PO TABS
10.0000 mg | ORAL_TABLET | Freq: Every day | ORAL | Status: DC
  Administered 2020-12-30: 10 mg via ORAL
  Filled 2020-12-29: qty 1

## 2020-12-29 MED ORDER — VITAMIN D 25 MCG (1000 UNIT) PO TABS
5000.0000 [IU] | ORAL_TABLET | Freq: Every evening | ORAL | Status: DC
  Administered 2020-12-29: 5000 [IU] via ORAL
  Filled 2020-12-29: qty 5

## 2020-12-29 MED ORDER — DIPHENHYDRAMINE HCL 50 MG/ML IJ SOLN: 12.5000 mg | Freq: Four times a day (QID) | INTRAMUSCULAR | Status: DC | PRN

## 2020-12-29 MED ORDER — SUGAMMADEX SODIUM 500 MG/5ML IV SOLN
INTRAVENOUS | Status: AC
  Filled 2020-12-29: qty 5

## 2020-12-29 MED ORDER — FENTANYL CITRATE (PF) 100 MCG/2ML IJ SOLN
INTRAMUSCULAR | Status: AC
  Filled 2020-12-29: qty 4

## 2020-12-29 MED ORDER — HYDROMORPHONE HCL 1 MG/ML IJ SOLN: 0.2500 mg | INTRAMUSCULAR | Status: DC | PRN

## 2020-12-29 MED ORDER — DIPHENHYDRAMINE HCL 12.5 MG/5ML PO ELIX: 12.5000 mg | ORAL_SOLUTION | Freq: Four times a day (QID) | ORAL | Status: DC | PRN

## 2020-12-29 MED ORDER — FUROSEMIDE 40 MG PO TABS
40.0000 mg | ORAL_TABLET | Freq: Every day | ORAL | Status: DC
  Administered 2020-12-30: 40 mg via ORAL
  Filled 2020-12-29: qty 1

## 2020-12-29 MED ORDER — ONDANSETRON HCL 4 MG/2ML IJ SOLN
INTRAMUSCULAR | Status: AC
  Filled 2020-12-29: qty 2

## 2020-12-29 MED ORDER — ROCURONIUM BROMIDE 10 MG/ML (PF) SYRINGE
PREFILLED_SYRINGE | INTRAVENOUS | Status: AC
  Filled 2020-12-29: qty 10

## 2020-12-29 MED ORDER — ONDANSETRON HCL 4 MG/2ML IJ SOLN: 4.0000 mg | INTRAMUSCULAR | Status: DC | PRN

## 2020-12-29 MED ORDER — DEXAMETHASONE SODIUM PHOSPHATE 10 MG/ML IJ SOLN
INTRAMUSCULAR | Status: DC | PRN
  Administered 2020-12-29: 4 mg via INTRAVENOUS

## 2020-12-29 MED ORDER — CITALOPRAM HYDROBROMIDE 20 MG PO TABS
40.0000 mg | ORAL_TABLET | Freq: Every day | ORAL | Status: DC
  Administered 2020-12-29: 40 mg via ORAL
  Filled 2020-12-29: qty 2

## 2020-12-29 MED ORDER — ACETAMINOPHEN 325 MG PO TABS
650.0000 mg | ORAL_TABLET | ORAL | Status: DC | PRN
Start: 2020-12-29 — End: 2020-12-30

## 2020-12-29 MED ORDER — ZOLPIDEM TARTRATE 5 MG PO TABS: 10.0000 mg | ORAL_TABLET | Freq: Every evening | ORAL | Status: DC | PRN

## 2020-12-29 MED ORDER — CIPROFLOXACIN IN D5W 400 MG/200ML IV SOLN: 400.0000 mg | Freq: Two times a day (BID) | INTRAVENOUS | Status: DC

## 2020-12-29 MED ORDER — LIDOCAINE HCL (PF) 1 % IJ SOLN
INTRAMUSCULAR | Status: AC | PRN
  Administered 2020-12-29: 10 mL via INTRADERMAL

## 2020-12-29 MED ORDER — OXYCODONE HCL 5 MG/5ML PO SOLN: 5.0000 mg | Freq: Once | ORAL | Status: DC | PRN

## 2020-12-29 MED ORDER — LEVOFLOXACIN IN D5W 750 MG/150ML IV SOLN
750.0000 mg | Freq: Once | INTRAVENOUS | Status: AC
  Administered 2020-12-29: 750 mg via INTRAVENOUS
  Filled 2020-12-29 (×2): qty 150

## 2020-12-29 MED ORDER — IOHEXOL 300 MG/ML  SOLN
50.0000 mL | Freq: Once | INTRAMUSCULAR | Status: AC | PRN
  Administered 2020-12-29: 15 mL

## 2020-12-29 MED ORDER — LIDOCAINE HCL 1 % IJ SOLN
INTRAMUSCULAR | Status: AC
  Filled 2020-12-29: qty 20

## 2020-12-29 MED ORDER — DOCUSATE SODIUM 100 MG PO CAPS
100.0000 mg | ORAL_CAPSULE | Freq: Two times a day (BID) | ORAL | Status: DC
  Administered 2020-12-30: 100 mg via ORAL
  Filled 2020-12-29 (×2): qty 1

## 2020-12-29 MED ORDER — MIDAZOLAM HCL 2 MG/2ML IJ SOLN
INTRAMUSCULAR | Status: AC
  Filled 2020-12-29: qty 4

## 2020-12-29 MED ORDER — ROCURONIUM BROMIDE 10 MG/ML (PF) SYRINGE
PREFILLED_SYRINGE | INTRAVENOUS | Status: DC | PRN
  Administered 2020-12-29: 70 mg via INTRAVENOUS

## 2020-12-29 MED ORDER — LACTATED RINGERS IV SOLN: INTRAVENOUS | Status: DC

## 2020-12-29 MED ORDER — ORAL CARE MOUTH RINSE: 15.0000 mL | Freq: Once | OROMUCOSAL | Status: AC

## 2020-12-29 MED ORDER — NEBIVOLOL HCL 5 MG PO TABS
5.0000 mg | ORAL_TABLET | Freq: Every day | ORAL | Status: DC
  Administered 2020-12-30: 5 mg via ORAL
  Filled 2020-12-29: qty 1

## 2020-12-29 MED ORDER — ROSUVASTATIN CALCIUM 20 MG PO TABS
20.0000 mg | ORAL_TABLET | Freq: Every day | ORAL | Status: DC
  Administered 2020-12-30: 20 mg via ORAL
  Filled 2020-12-29: qty 1

## 2020-12-29 MED ORDER — LIDOCAINE 2% (20 MG/ML) 5 ML SYRINGE
INTRAMUSCULAR | Status: DC | PRN
  Administered 2020-12-29: 100 mg via INTRAVENOUS

## 2020-12-29 MED ORDER — SENNA 8.6 MG PO TABS
1.0000 | ORAL_TABLET | Freq: Two times a day (BID) | ORAL | Status: DC
  Administered 2020-12-30: 8.6 mg via ORAL
  Filled 2020-12-29 (×2): qty 1

## 2020-12-29 MED ORDER — MIDAZOLAM HCL 2 MG/2ML IJ SOLN
INTRAMUSCULAR | Status: AC | PRN
  Administered 2020-12-29 (×2): 1 mg via INTRAVENOUS

## 2020-12-29 MED ORDER — OXYBUTYNIN CHLORIDE 5 MG PO TABS: 5.0000 mg | ORAL_TABLET | Freq: Three times a day (TID) | ORAL | Status: DC | PRN

## 2020-12-29 MED ORDER — PROPOFOL 10 MG/ML IV BOLUS
INTRAVENOUS | Status: AC
  Filled 2020-12-29: qty 20

## 2020-12-29 MED ORDER — POLYVINYL ALCOHOL 1.4 % OP SOLN
1.0000 [drp] | Freq: Four times a day (QID) | OPHTHALMIC | Status: DC | PRN
  Filled 2020-12-29: qty 15

## 2020-12-29 MED ORDER — VANCOMYCIN HCL IN DEXTROSE 1-5 GM/200ML-% IV SOLN: 1000.0000 mg | Freq: Once | INTRAVENOUS | Status: DC

## 2020-12-29 MED ORDER — BACITRACIN-NEOMYCIN-POLYMYXIN 400-5-5000 EX OINT: 1.0000 "application " | TOPICAL_OINTMENT | Freq: Three times a day (TID) | CUTANEOUS | Status: DC | PRN

## 2020-12-29 MED ORDER — FENTANYL CITRATE (PF) 100 MCG/2ML IJ SOLN
INTRAMUSCULAR | Status: AC | PRN
  Administered 2020-12-29 (×4): 50 ug via INTRAVENOUS

## 2020-12-29 MED ORDER — IOHEXOL 300 MG/ML  SOLN
INTRAMUSCULAR | Status: DC | PRN
  Administered 2020-12-29: 17 mL

## 2020-12-29 MED ORDER — HYDROMORPHONE HCL 1 MG/ML IJ SOLN
0.5000 mg | INTRAMUSCULAR | Status: DC | PRN
  Administered 2020-12-29: 1 mg via INTRAVENOUS
  Filled 2020-12-29: qty 1

## 2020-12-29 MED ORDER — OXYCODONE HCL 5 MG PO TABS: 5.0000 mg | ORAL_TABLET | ORAL | Status: DC | PRN

## 2020-12-29 MED ORDER — LIDOCAINE 2% (20 MG/ML) 5 ML SYRINGE
INTRAMUSCULAR | Status: AC
  Filled 2020-12-29: qty 5

## 2020-12-29 MED ORDER — SUGAMMADEX SODIUM 200 MG/2ML IV SOLN
INTRAVENOUS | Status: DC | PRN
  Administered 2020-12-29: 250 mg via INTRAVENOUS

## 2020-12-29 MED ORDER — SODIUM CHLORIDE 0.9 % IV SOLN: INTRAVENOUS | Status: DC

## 2020-12-29 MED ORDER — PREDNISONE 20 MG PO TABS: 20.0000 mg | ORAL_TABLET | Freq: Every day | ORAL | Status: DC

## 2020-12-29 MED ORDER — INSULIN ASPART 100 UNIT/ML IJ SOLN
0.0000 [IU] | Freq: Three times a day (TID) | INTRAMUSCULAR | Status: DC
  Administered 2020-12-30: 3 [IU] via SUBCUTANEOUS

## 2020-12-29 MED ORDER — GABAPENTIN 100 MG PO CAPS: 200.0000 mg | ORAL_CAPSULE | Freq: Every day | ORAL | Status: DC

## 2020-12-29 SURGICAL SUPPLY — 57 items
APL PRP STRL LF DISP 70% ISPRP (MISCELLANEOUS) ×1
APL SKNCLS STERI-STRIP NONHPOA (GAUZE/BANDAGES/DRESSINGS) ×1
BAG COUNTER SPONGE SURGICOUNT (BAG) IMPLANT
BAG DRN RND TRDRP ANRFLXCHMBR (UROLOGICAL SUPPLIES)
BAG SPNG CNTER NS LX DISP (BAG)
BAG URINE DRAIN 2000ML AR STRL (UROLOGICAL SUPPLIES) IMPLANT
BASKET ZERO TIP NITINOL 2.4FR (BASKET) ×1 IMPLANT
BENZOIN TINCTURE PRP APPL 2/3 (GAUZE/BANDAGES/DRESSINGS) ×2 IMPLANT
BLADE SURG 15 STRL LF DISP TIS (BLADE) ×1 IMPLANT
BLADE SURG 15 STRL SS (BLADE) ×2
BSKT STON RTRVL ZERO TP 2.4FR (BASKET) ×1
CATH FOLEY 2W COUNCIL 20FR 5CC (CATHETERS) IMPLANT
CATH ROBINSON RED A/P 20FR (CATHETERS) IMPLANT
CATH URET DUAL LUMEN 6-10FR 50 (CATHETERS) ×2 IMPLANT
CATH UROLOGY TORQUE 65 (CATHETERS) ×1 IMPLANT
CATH X-FORCE N30 NEPHROSTOMY (TUBING) ×2 IMPLANT
CHLORAPREP W/TINT 26 (MISCELLANEOUS) ×2 IMPLANT
COVER SURGICAL LIGHT HANDLE (MISCELLANEOUS) IMPLANT
DRAPE C-ARM 42X120 X-RAY (DRAPES) ×2 IMPLANT
DRAPE LINGEMAN PERC (DRAPES) ×2 IMPLANT
DRAPE SURG IRRIG POUCH 19X23 (DRAPES) ×2 IMPLANT
DRSG PAD ABDOMINAL 8X10 ST (GAUZE/BANDAGES/DRESSINGS) ×4 IMPLANT
DRSG TEGADERM 4X4.75 (GAUZE/BANDAGES/DRESSINGS) ×1 IMPLANT
DRSG TEGADERM 8X12 (GAUZE/BANDAGES/DRESSINGS) IMPLANT
GAUZE SPONGE 4X4 12PLY STRL (GAUZE/BANDAGES/DRESSINGS) ×2 IMPLANT
GLOVE SURG ENC MOIS LTX SZ7.5 (GLOVE) ×2 IMPLANT
GOWN STRL REUS W/TWL XL LVL3 (GOWN DISPOSABLE) ×2 IMPLANT
GUIDEWIRE AMPLAZ .035X145 (WIRE) ×4 IMPLANT
GUIDEWIRE ANG ZIPWIRE 038X150 (WIRE) ×1 IMPLANT
GUIDEWIRE STR DUAL SENSOR (WIRE) ×1 IMPLANT
GUIDEWIRE ZIPWRE .038 STRAIGHT (WIRE) ×1 IMPLANT
KIT BASIN OR (CUSTOM PROCEDURE TRAY) ×2 IMPLANT
KIT PROBE TRILOGY 3.9X350 (MISCELLANEOUS) IMPLANT
KIT TURNOVER KIT A (KITS) ×2 IMPLANT
LASER FIB FLEXIVA PULSE ID 365 (Laser) IMPLANT
MANIFOLD NEPTUNE II (INSTRUMENTS) ×2 IMPLANT
NS IRRIG 1000ML POUR BTL (IV SOLUTION) ×2 IMPLANT
PACK CYSTO (CUSTOM PROCEDURE TRAY) ×2 IMPLANT
SET AMPLATZ RENAL DILATOR (MISCELLANEOUS) ×1 IMPLANT
SPONGE T-LAP 4X18 ~~LOC~~+RFID (SPONGE) ×2 IMPLANT
STENT ENDOURETEROTOMY 7-14 26C (STENTS) IMPLANT
STENT URET 6FRX26 CONTOUR (STENTS) ×1 IMPLANT
SUT CHROMIC 3 0 SH 27 (SUTURE) IMPLANT
SUT MNCRL AB 4-0 PS2 18 (SUTURE) ×1 IMPLANT
SUT SILK 2 0 30  PSL (SUTURE)
SUT SILK 2 0 30 PSL (SUTURE) IMPLANT
SYR 10ML LL (SYRINGE) ×2 IMPLANT
SYR 20ML LL LF (SYRINGE) ×4 IMPLANT
TOWEL OR 17X26 10 PK STRL BLUE (TOWEL DISPOSABLE) ×2 IMPLANT
TOWEL OR NON WOVEN STRL DISP B (DISPOSABLE) ×2 IMPLANT
TRACTIP FLEXIVA PULS ID 200XHI (Laser) IMPLANT
TRACTIP FLEXIVA PULSE ID 200 (Laser)
TRAY FOLEY MTR SLVR 16FR STAT (SET/KITS/TRAYS/PACK) ×2 IMPLANT
TUBING CONNECTING 10 (TUBING) ×4 IMPLANT
TUBING STONE CATCHER TRILOGY (MISCELLANEOUS) IMPLANT
TUBING UROLOGY SET (TUBING) ×2 IMPLANT
WATER STERILE IRR 1000ML POUR (IV SOLUTION) ×2 IMPLANT

## 2020-12-29 NOTE — Op Note (Signed)
Operative Note  Preoperative diagnosis:  1.  Left renal calculus/ureteropelvic junction calculus  Postoperative diagnosis: 1.  Left renal calculus/ureteropelvic junction calculus with hydronephrosis and obstruction  Procedure(s): 1.  Left percutaneous nephrolithotomy   Surgeon: Link Snuffer, MD  Assistants: Reola Mosher  Anesthesia: General  Complications: None immediate  EBL: 50 cc  Specimens: 1.  Renal calculus  Drains/Catheters: 1.  6 x 26 double-J ureteral stent  Intraoperative findings: 1.  Impacted ureteropelvic junction stone completely removed.  Quite a bit of ureteropelvic junction inflammation after removal of the stone but no evidence of any perforation.  Had significant hydronephrosis.  Multiple other renal calculi that were able to be extracted.  Total stone burden was over 2 cm.  Indication: 65 year old male with a large left renal calculus presents for the previously mentioned operation.  Description of procedure:  The patient was identified and consent was obtained.  The patient was taken to the operating room and placed in the supine position.  The patient was placed under general anesthesia.  Perioperative antibiotics were administered.  The patient was placed in prone position and all pressure points were padded.  Patient was prepped and draped in a standard sterile fashion and a timeout was performed.  A Super Stiff wire was advanced through the nephroureteral stent down to the bladder under fluoroscopic guidance and the nephroureteral stent was removed.  A dual-lumen ureteral catheter was advanced over the Super Stiff wire but met resistance at the renal parenchymal margin.  In the process of advancing the dual-lumen ureteral catheter, the wire was withdrawn from the ureter.  It maintained access in the kidney.  I withdrew the dual-lumen ureteral catheter and subsequently used renal dilators to sequentially dilate from 6 [place] up to 10 [place].  The dual-lumen  ureteral catheter then was able to be advanced over the wire and into the kidney.  Antegrade nephrostogram was performed that revealed severe hydronephrosis.  No contrast extended into the ureter consistent with severe obstruction from the ureteropelvic junction calculus.  I passed a second wire through the dual-lumen into the kidney.  I withdrew the dual-lumen. An incision was made alongside the wires.  The balloon dilator was then advanced over one of the wires and into the renal pelvis fluoroscopic guidance and the tract was dilated to a pressure of 18.  The sheath was advanced over the balloon and into the renal pelvis.  The balloon was withdrawn keeping the sheath in place.  The nephroscope was advanced into the kidney and the stone of interest was encountered.  The ureteropelvic junction stone was then removed with a combination of pneumatic and ultrasound with suction followed by graspers.  Once the obstruction was relieved, I advanced a sensor wire through the scope and down the ureter and into the bladder under fluoroscopic guidance.  Nephroscopy was performed with the rigid scope followed by the cystoscope.  Additional stone fragments were extracted.  All stone was removed and there was no evidence of any other stones within the kidney.  The nephroscope along with the sheath were withdrawn.    A 6 x 26 double-J ureteral stent was advanced over 1 of the wires under fluoroscopic guidance and the wire was withdrawn.  Fluoroscopy confirmed a good coil within the bladder as well as a good coil in the renal pelvis proximally.  The incision was closed with a running 4-0 Monocryl.  This concluded the operation.  The patient tolerated procedure well and was stable postoperatively.  Plan: Patient will remain  under observation overnight. Stent to be removed in 2 weeks.

## 2020-12-29 NOTE — Anesthesia Procedure Notes (Signed)
Procedure Name: Intubation Date/Time: 12/29/2020 11:39 AM Performed by: Montel Clock, CRNA Pre-anesthesia Checklist: Patient identified, Emergency Drugs available, Suction available, Patient being monitored and Timeout performed Patient Re-evaluated:Patient Re-evaluated prior to induction Oxygen Delivery Method: Circle system utilized Preoxygenation: Pre-oxygenation with 100% oxygen Induction Type: IV induction Ventilation: Mask ventilation without difficulty and Oral airway inserted - appropriate to patient size Laryngoscope Size: Mac and 3 Grade View: Grade II Tube type: Oral Tube size: 7.5 mm Number of attempts: 1 Airway Equipment and Method: Stylet Placement Confirmation: ETT inserted through vocal cords under direct vision, positive ETCO2 and breath sounds checked- equal and bilateral Secured at: 23 cm Tube secured with: Tape Dental Injury: Teeth and Oropharynx as per pre-operative assessment  Comments: Ett easily passed through cords with MAC3. Glottis deep(MAC 4 may be more optimal)

## 2020-12-29 NOTE — H&P (Signed)
CC/HPI: CC: Left hydronephrosis with ureteral calculus  HPI:  12/12/2020  64 year old male with flank pain underwent renal ultrasound. This was performed on 12/10/2020 and revealed a likely left-sided ureteropelvic junction calculus with left-sided Hydronephrosis. He denies any flank pain. He does have microscopic hematuria today. Denies any fever, chill, nausea, vomiting. Denies gross hematuria. He passed a few stones several months ago. Never required intervention. He has a family history of prostate cancer. He states that his primary care physician is following him for that. He wants to continue to be followed therefore his screening. He has a history of hyperparathyroidism and his Ca in 10/21 was 10.6. He has not been treated for that.     ALLERGIES: Amoxicillin Augmentin    MEDICATIONS: Lisinopril  Metformin Hcl  Amlodipine Besylate  Furosemide  Lantus  Potassium Gluconate  Rosuvastatin Calcium  Vitamin D3     GU PSH: None   NON-GU PSH: Hip Replacement, Right Shoulder Arthroscopy/surgery, Left     GU PMH: None   NON-GU PMH: Anxiety Depression Diabetes Type 2 Hypercholesterolemia Hypertension Sleep Apnea    FAMILY HISTORY: 1 Daughter - Other 1 son - Other pancreatic cancer - Mother Prostate Cancer - Father   SOCIAL HISTORY: Marital Status: Married Preferred Language: English; Race: White Current Smoking Status: Patient does not smoke anymore. Has not smoked since 12/02/2002.   Tobacco Use Assessment Completed: Used Tobacco in last 30 days? Drinks 2 caffeinated drinks per day.    REVIEW OF SYSTEMS:    GU Review Male:   Patient reports erection problems. Patient denies frequent urination, hard to postpone urination, burning/ pain with urination, get up at night to urinate, leakage of urine, stream starts and stops, trouble starting your stream, have to strain to urinate , and penile pain.  Gastrointestinal (Upper):   Patient denies nausea, vomiting, and  indigestion/ heartburn.  Gastrointestinal (Lower):   Patient denies diarrhea and constipation.  Constitutional:   Patient reports fatigue. Patient denies fever, night sweats, and weight loss.  Skin:   Patient denies itching and skin rash/ lesion.  Eyes:   Patient denies blurred vision and double vision.  Ears/ Nose/ Throat:   Patient denies sore throat and sinus problems.  Hematologic/Lymphatic:   Patient denies swollen glands and easy bruising.  Cardiovascular:   Patient denies leg swelling and chest pains.  Respiratory:   Patient denies cough and shortness of breath.  Endocrine:   Patient denies excessive thirst.  Musculoskeletal:   Patient reports back pain. Patient denies joint pain.  Neurological:   Patient denies headaches and dizziness.  Psychologic:   Patient denies depression and anxiety.   VITAL SIGNS:      12/12/2020 01:05 PM  Weight 246 lb / 111.58 kg  Height 67 in / 170.18 cm  BP 157/82 mmHg  Heart Rate 84 /min  Temperature 97.0 F / 36.1 C  BMI 38.5 kg/m   MULTI-SYSTEM PHYSICAL EXAMINATION:    Constitutional: Obese. No physical deformities. Normally developed. Good grooming.   Respiratory: Normal breath sounds. No labored breathing, no use of accessory muscles.   Cardiovascular: Regular rate and rhythm. No murmur, no gallop.   Skin: No paleness, no jaundice, no cyanosis. No lesion, no ulcer, no rash.  Neurologic / Psychiatric: Oriented to time, oriented to place, oriented to person. No depression, no anxiety, no agitation.  Gastrointestinal: Obese abdomen. No mass, no tenderness, no rigidity.   Musculoskeletal: Normal gait and station of head and neck.     Complexity of Data:  Source Of History:  Patient  Records Review:   Previous Doctor Records, Previous Patient Records  Urine Test Review:   Urinalysis  X-Ray Review: Lower Spine: Reviewed Films. Discussed With Patient.  Renal Ultrasound: Reviewed Films. Reviewed Report. Discussed With Patient.  C.T. Stone  Protocol: Reviewed Films. Discussed With Patient.     PROCEDURES:         C.T. Urogram - [id number]      Patient confirmed No Neulasta OnPro Device.         Urinalysis w/Scope Dipstick Dipstick Cont'd Micro  Color: Yellow Bilirubin: Neg mg/dL WBC/hpf: NS (Not Seen)  Appearance: Clear Ketones: Neg mg/dL RBC/hpf: 3 - 02/DXA  Specific Gravity: 1.015 Blood: 2+ ery/uL Bacteria: Rare (0-9/hpf)  pH: <=5.0 Protein: Neg mg/dL Cystals: Ca Oxalate  Glucose: Neg mg/dL Urobilinogen: 0.2 mg/dL Casts: NS (Not Seen)    Nitrites: Neg Trichomonas: Not Present    Leukocyte Esterase: Neg leu/uL Mucous: Not Present      Epithelial Cells: NS (Not Seen)      Yeast: NS (Not Seen)      Sperm: Not Present    ASSESSMENT:      ICD-10 Details  1 GU:   Renal and ureteral calculus - N20.2 Chronic, Threat to Bodily Function - He has a 1.3cm left UPJ stone with obstruction and a 2.3cm cluster of stones in the lower pole and renal pelvis. He has no pain but is obstructed and is concerned about renal damage. I reviewed the options including ESWL, URS and PCNL and believe he will be best served by PCNL. I reviewed the risks of bleeding, infection, injury to the kidney and adjacent structures, renal loss, need for secondary procedures, urine leaks, thrombotic events and anesthetic complications.   2   Ureteral obstruction secondary to calculous - N13.2 Chronic, Threat to Bodily Function  3 NON-GU:   Primary hyperparathyroidism - E21.0 Chronic, Stable - He is follow by Dr. Talmage Nap for hyperparathyroidsim with a Ca of 10.6 last fall. He will need this worked up more aggressively and treated in the face of the active stone disease.   4   Hypercalcemia - E83.52 Chronic, Stable   PLAN:            Medications New Meds: Bactrim Ds 800 mg-160 mg tablet 1 tablet PO BID   #28  0 Refill(s)            Orders Labs Urine Culture  X-Rays: C.T. Stone Protocol Without I.V. Contrast          Schedule Return Visit/Planned Activity:  ASAP - Schedule Surgery  Procedure: Unspecified Date - PCNL - 50080, left Notes: Next available.           Document Letter(s):  Created for Patient: Clinical Summary         Notes:   We discussed the management of urinary stones. These options include observation, ureteroscopy, shockwave lithotripsy, and PCNL. We discussed which options are relevant to these particular stones. We discussed the natural history of stones as well as the complications of untreated stones and the impact on quality of life without treatment as well as with each of the above listed treatments. We also discussed the efficacy of each treatment in its ability to clear the stone burden. With any of these management options I discussed the signs and symptoms of infection and the need for emergent treatment should these be experienced. For each option we discussed the ability of each procedure to clear the patient of  their stone burden.   For observation I described the risks which include but are not limited to silent renal damage, life-threatening infection, need for emergent surgery, failure to pass stone, and pain.   For ureteroscopy I described the risks which include heart attack, stroke, pulmonary embolus, death, bleeding, infection, damage to contiguous structures, positioning injury, ureteral stricture, ureteral avulsion, ureteral injury, need for ureteral stent, inability to perform ureteroscopy, need for an interval procedure, inability to clear stone burden, stent discomfort and pain.   For shockwave lithotripsy I described the risks which include arrhythmia, kidney contusion, kidney hemorrhage, need for transfusion, pain, inability to break up stone, inability to pass stone fragments, Steinstrasse, infection associated with obstructing stones, need for different surgical procedure, need for repeat shockwave lithotripsy, and death.   For PCNL I described the risks including heart attack, pulmonary embolus, death,  positioning injury, pneumothorax, hydrothorax, need for chest tube, inability to clear stone burden, renal laceration, arterial venous fistula or malformation, need for embolization of kidney, loss of kidney or renal function, need for repeat procedure, need for prolonged nephrostomy tube, ureteral avulsion.   he would like to proceed with left PCNL.   Cc: Dr. Paulino Rily  Dr. Katrinka Blazing    Signed by Bjorn Pippin, M.D. on 12/12/20 at 5:25 PM (EDT

## 2020-12-29 NOTE — Anesthesia Postprocedure Evaluation (Signed)
Anesthesia Post Note  Patient: Steven Contreras  Procedure(s) Performed: LEFT NEPHROLITHOTOMY PERCUTANEOUS (Left)     Patient location during evaluation: PACU Anesthesia Type: General Level of consciousness: awake and alert Pain management: pain level controlled Vital Signs Assessment: post-procedure vital signs reviewed and stable Respiratory status: spontaneous breathing, nonlabored ventilation, respiratory function stable and patient connected to nasal cannula oxygen Cardiovascular status: blood pressure returned to baseline and stable Postop Assessment: no apparent nausea or vomiting Anesthetic complications: no   No notable events documented.  Last Vitals:  Vitals:   12/29/20 1400 12/29/20 1415  BP: 137/76 128/72  Pulse: 69 64  Resp: 17 13  Temp:    SpO2: 99% 97%    Last Pain:  Vitals:   12/29/20 1415  TempSrc:   PainSc: 0-No pain                 Chardonay Scritchfield S

## 2020-12-29 NOTE — Procedures (Signed)
Vascular and Interventional Radiology Procedure Note  Patient: Steven Contreras DOB: 1956/04/02 Medical Record Number: [id number] Note Date/Time: 12/29/20 10:52 AM   Performing Physician: Roanna Banning, MD Assistant(s): None  Diagnosis: L UPJ stone  Procedure:  LEFT NEPHROSTOMY TUBE PLACEMENT LEFT ANTEROGRADE NEPHROSTOGRAM  Anesthesia: Conscious Sedation Complications: None Estimated Blood Loss: Minimal Specimens:  None  Findings:  - Successful placement of left-sided, 5 F nephroureteral access catheter into the left kidney(s).  - Tip of catheter within urinary bladder.  Plan: PCNL per Urology.  See detailed procedure note with images in PACS. The patient tolerated the procedure well without incident or complication and was returned to PACU in stable condition.    Roanna Banning, MD [facility]   Pager. [phone] Clinic. [phone]

## 2020-12-29 NOTE — Discharge Instructions (Signed)
Discharge instructions following PCNL ° °Call your doctor for: °Fevers greater than 100.5 °Severe nausea or vomiting °Increasing pain not controlled by pain medication °Increasing redness or drainage from incisions °Decreased urine output or a catheter is no longer draining ° °The number for questions is [phone]. ° °Activity: °Gradually increase activity with short frequent walks, 3-4 times a day.  Avoid strenuous activities, like sports, lawn-mowing, or heavy lifting (more than 10-15 pounds).  Wear loose, comfortable clothing that pull or kink the tube or tubes.  Do not drive while taking pain medication, or until your doctor permitts it. ° °Bathing and dressing changes: °You should not shower for 48 hours after surgery.  Do not soak your back in a bathtub. ° °Drainage bag care: °You may be discharged with a drainage bag around the site of your surgery.  The drainage bag should be secured such that it never pulls or loosens to prevent it from leaking.  It is important to wash her hands before and after emptying the drainage bag to help prevent the spread of infection.  The drainage bag should be emptied as needed.  When the wound stops draining or it is manageable with a dry gauze dressing, you can remove the bag. ° °If your tube in the back was removed, you should expect to have some leakage of fluid from the back incision.  This should slowly decrease and stop over the next couple of days.  If you have severe pain or persistent leakage, please call the number above.  Otherwise, your dressing can be changed 1-2 times daily or more if needed. ° °Diet: °It is extremely important to drink plenty of fluids after surgery, especially water.  You may resume your regular diet, unless otherwise instructed. ° °Medications: °May take Tylenol (acetaminophen) or ibuprofen (Advil, Motrin) as directed over-the-counter. °Take any prescriptions as directed. ° °Follow-up appointments: °Follow-up appointment will be scheduled  with Dr. Brylan Seubert °

## 2020-12-29 NOTE — H&P (Signed)
Chief Complaint: Left sided obstructing kidney stone with hydroureteronephrosis. Request is for left sided nephrostomy tube placement for nephrolithotomy access.   Referring Physician(s): Ray ChurchBell,Eugene D III  Supervising Physician: Roanna BanningMugweru, Jon  [facility]  History of Present Illness: Steven Contreras is a 65 y.o. male outpatient. History of DM, HLD, HTN,  known left sided ureteral stone.  CT abd pelvis from 8.18.22 reads Similar moderate left hydroureteronephrosis to the level of a 12 mm stone near the UPJ. Multiple additional non obstructing left renal calculi measuring up to 8 mm. team is requesting a left sided nephrostomy tube placement for access for scheduled nephrolithotomy.  Currently without any significant complaints. Patient alert and laying in bed, calm and comfortable. Endorses lower back pain right greater than left. Denies any fevers, headache, chest pain, SOB, cough, abdominal pain, nausea, vomiting or bleeding.    Past Medical History:  Diagnosis Date   ADD (attention deficit disorder)    Anxiety    hx of   Arthritis    Diabetes (HCC)    Eczema    Family history of adverse reaction to anesthesia    mother slow to awaken   Headache    High cholesterol    History of kidney stones    Hypertension    Right rotator cuff tear     Past Surgical History:  Procedure Laterality Date   colonscopy  2014   SHOULDER SURGERY Left years ago   rotator, bicep and clavicle repair.   TOTAL HIP ARTHROPLASTY Right 10/08/2014   Procedure: RIGHT TOTAL HIP ARTHROPLASTY ANTERIOR APPROACH;  Surgeon: Durene RomansMatthew Olin, MD;  Location: [facility];  Service: Orthopedics;  Laterality: Right;    Allergies: Ace inhibitors, Ampicillin, Bupropion, and Venlafaxine  Medications: Prior to Admission medications   Medication Sig Start Date End Date Taking? Authorizing Provider  amLODipine (NORVASC) 10 MG tablet Take 10 mg by mouth daily.    [provider]  aspirin EC 81  MG tablet Take 81 mg by mouth daily. Swallow whole.    [provider]  Cholecalciferol (DIALYVITE VITAMIN D 5000) 125 MCG (5000 UT) capsule Take 10,000 Units by mouth every evening.    [provider]  citalopram (CELEXA) 40 MG tablet Take 40 mg by mouth daily.    [provider]  Colloidal Oatmeal (GOLD BOND ECZEMA RELIEF) 2 % CREA Apply 1 application topically daily as needed (eczema).    [provider]  etodolac (LODINE) 400 MG tablet Take 400 mg by mouth 2 (two) times daily.    [provider]  furosemide (LASIX) 40 MG tablet Take 40 mg by mouth daily. 02/12/20   [provider]  gabapentin (NEURONTIN) 100 MG capsule Take two capsules (total 200mg ) daily at bedtime 12/03/20   Judi SaaSmith, Zachary M, DO  insulin glargine (LANTUS SOLOSTAR) 100 UNIT/ML Solostar Pen Inject 20 Units into the skin daily.    [provider]  loratadine (CLARITIN) 10 MG tablet Take 10 mg by mouth daily as needed for allergies.    [provider]  losartan (COZAAR) 50 MG tablet Take 1 tablet (50 mg total) by mouth daily. Patient not taking: No sig reported 02/22/20 12/16/20  Patwardhan, Anabel BeneManish J, MD  metFORMIN (GLUCOPHAGE) 500 MG tablet Take 1,000 mg by mouth 2 (two) times daily.    [provider]  nebivolol (BYSTOLIC) 5 MG tablet Take 2 tablets (10 mg total) by mouth daily. Patient taking differently: Take 5 mg by mouth daily. 03/11/20  Patwardhan, Manish J, MD  polyvinyl alcohol (LIQUIFILM TEARS) 1.4 % ophthalmic solution Place 1 drop into both eyes 4 (four) times daily as needed for dry eyes.    [provider]  potassium gluconate 595 (99 K) MG TABS tablet Take 595 mg by mouth daily.    [provider]  predniSONE (DELTASONE) 20 MG tablet Take 1 tablet (20 mg total) by mouth daily with breakfast. 12/03/20   Judi Saa, DO  rosuvastatin (CRESTOR) 20 MG tablet Take 1 tablet (20 mg total) by mouth daily. 03/11/20 12/16/20   Patwardhan, Anabel Bene, MD  zolpidem (AMBIEN) 10 MG tablet Take 10 mg by mouth at bedtime as needed for sleep. 05/09/20   [provider]     Family History  Problem Relation Age of Onset   Pancreatic cancer Mother    Prostate cancer Father    Allergic rhinitis Sister     Social History   Socioeconomic History   Marital status: Married    Spouse name: Not on file   Number of children: 3   Years of education: Not on file   Highest education level: Not on file  Occupational History   Not on file  Tobacco Use   Smoking status: Former    Packs/day: 0.50    Years: 15.00    Pack years: 7.50    Types: Cigarettes    Quit date: 05/03/2006    Years since quitting: 14.6   Smokeless tobacco: Never  Vaping Use   Vaping Use: Former  Substance and Sexual Activity   Alcohol use: Yes    Comment: rarely   Drug use: Never   Sexual activity: Not on file  Other Topics Concern   Not on file  Social History Narrative   Not on file   Social Determinants of Health   Financial Resource Strain: Not on file  Food Insecurity: Not on file  Transportation Needs: Not on file  Physical Activity: Not on file  Stress: Not on file  Social Connections: Not on file     Review of Systems: A 12 point ROS discussed and pertinent positives are indicated in the HPI above.  All other systems are negative.  Review of Systems  Constitutional:  Negative for fever.  HENT:  Negative for congestion.   Respiratory:  Negative for cough and shortness of breath.   Cardiovascular:  Negative for chest pain.  Gastrointestinal:  Negative for abdominal pain.  Musculoskeletal:  Positive for back pain (lower back worse right then left.).  Neurological:  Negative for headaches.  Psychiatric/Behavioral:  Negative for behavioral problems and confusion.    Vital Signs: There were no vitals taken for this visit.  Physical Exam Vitals and nursing note reviewed.  Constitutional:      Appearance: He is  well-developed.  HENT:     Head: Normocephalic.  Cardiovascular:     Rate and Rhythm: Normal rate and regular rhythm.     Heart sounds: Normal heart sounds.  Pulmonary:     Effort: Pulmonary effort is normal.     Breath sounds: Normal breath sounds.  Musculoskeletal:        General: Normal range of motion.     Cervical back: Normal range of motion.  Skin:    General: Skin is dry.  Neurological:     Mental Status: He is alert and oriented to person, place, and time.    Imaging: CT ABDOMEN PELVIS WO CONTRAST  Result Date: 12/18/2020 CLINICAL DATA:  Left-sided flank  pain with known left ureteral stone. EXAM: CT ABDOMEN AND PELVIS WITHOUT CONTRAST TECHNIQUE: Multidetector CT imaging of the abdomen and pelvis was performed following the standard protocol without IV contrast. COMPARISON:  December 12, 2020 FINDINGS: Lower chest: No acute abnormality. Hepatobiliary: Diffuse hepatic steatosis. Focal fatty sparing along the falciform ligament and gallbladder fossa. Tiny cholelithiasis without evidence of acute cholecystitis. No biliary ductal dilation. Pancreas: Unremarkable noncontrast appearance of the pancreatic parenchyma. No pancreatic ductal dilation. Spleen: Within normal limits. Adrenals/Urinary Tract: Bilateral adrenal glands are unremarkable. There are 4 layering calculi in the left lower pole collecting system and 4 calculi layering in the renal pelvis. Similar moderate left hydroureteronephrosis to the level of a 12 mm stone near the UPJ. No right renal calculi or hydronephrosis. Simple bilateral renal cysts measuring up to 2.8 cm on the right. Bilateral hyperdense renal cysts measuring up to 18 mm in the upper pole of left kidney. Urinary bladder is unremarkable for degree of distension. Stomach/Bowel: Stomach is within normal limits. Appendix is not discretely visualized. No evidence of bowel wall thickening, distention, or inflammatory changes. Vascular/Lymphatic: Aortic and branch vessel  atherosclerosis without abdominal aortic aneurysm. No pathologically enlarged abdominal or pelvic lymph nodes. Similar prominent left periaortic lymph nodes, likely reactive. Reproductive: Dystrophic prostatic calcifications. Other: No abdominopelvic ascites. Musculoskeletal: Right total hip arthroplasty. Multilevel degenerative changes spine. No acute osseous abnormality. IMPRESSION: 1. Similar moderate left hydroureteronephrosis to the level of a 12 mm stone near the UPJ. 2. Multiple additional nonobstructing left renal calculi measuring up to 8 mm. 3. Multiple bilateral renal lesions, although likely representing a combination of simple and hemorrhagic/proteinaceous cysts these are incompletely evaluated on non contrasted CT. Consider more definitive characterization with renal protocol MRI with and without contrast if clinically indicated. The 4. Diffuse hepatic steatosis. 5. Cholelithiasis without evidence of acute cholecystitis. 6.  Aortic Atherosclerosis (ICD10-I70.0). Electronically Signed   By: Maudry Mayhew M.D.   On: 12/18/2020 16:08   DG Lumbar Spine 2-3 Views  Result Date: 12/05/2020 CLINICAL DATA:  Low back pain EXAM: LUMBAR SPINE - 2-3 VIEW COMPARISON:  None. FINDINGS: Normal lumbar lordosis. No acute fracture or listhesis of the lumbar spine. Bilateral L5 pars defects may be present. Vertebral body height has been preserved. There is intervertebral disc space narrowing and endplate remodeling of L1-2 and to a lesser extent L4-5 and L5-S1 in keeping with changes of mild to moderate degenerative disc disease. Remaining intervertebral disc heights are preserved. Multiple calculi are seen within the expected interpolar region of the left kidney as well as the left renal pelvis. IMPRESSION: Multilevel degenerative disc disease within the lumbar spine, most severe at L1-2. Possible bilateral L5 pars defects without associated spondylolisthesis. Left nephro and urolithiasis. Renal sonography may be  helpful to assess for the presence of obstruction and Electronically Signed   By: Helyn Numbers MD   On: 12/05/2020 03:06   US RENAL  Result Date: 12/11/2020 CLINICAL DATA:  Kidney stone EXAM: RENAL / URINARY TRACT ULTRASOUND COMPLETE COMPARISON:  Radiograph 12/03/2020 FINDINGS: Right Kidney: Renal measurements: 14.3 x 8.4 x 8.8 cm = volume: 554 mL. There is a cystic lesion in the mid kidney measuring 1.6 x 1.5 x 1.5 cm with some internal echoes. There is an exophytic more complex appearing cystic lesion in the lower pole measuring 3.5 x 3.4 x 3.0 cm. Left Kidney: Renal measurements: 13.1 x 7.1 x 9.9 cm = volume: 483 mL. There is moderate-severe hydronephrosis with visible 1.6 cm shadowing stone at the  ureteropelvic junction. Additional 7 mm calculi in the upper pole the left kidney. There is a complex cystic lesion which is exophytic along the lower pole measuring 3.8 x 2.6 x 3.4 cm. There is another complex cystic lesion in the upper pole measuring 3.0 x 2.7 x 2.7 cm. Bladder: Appears normal for degree of bladder distention. Other: None. IMPRESSION: Obstructing 1.6 cm stone at the ureteropelvic junction with upstream hydronephrosis of the left kidney. Multiple cystic lesions bilaterally, some of which appear complex. Recommend renal protocol multiphase CT for further evaluation. These results will be called to the ordering clinician or representative by the Radiologist Assistant, and communication documented in the PACS or [employer]. Electronically Signed   By: Caprice Renshaw M.D.   On: 12/11/2020 10:44    Labs:  CBC: Recent Labs    12/05/20 1108 12/17/20 0945  WBC 11.2* 13.9*  HGB 15.1 15.3  HCT 44.7 46.5  PLT 288.0 279    COAGS: Recent Labs    12/17/20 0945  INR 0.9    BMP: Recent Labs    02/29/20 0859 12/17/20 0945  NA 137 134*  K 4.4 3.9  CL 99 99  CO2 22 25  GLUCOSE 142* 192*  BUN 12 21  CALCIUM 10.6* 10.1  CREATININE 1.03 0.95  GFRNONAA 76 >60  GFRAA 88  --      LIVER FUNCTION TESTS: No results for input(s): BILITOT, AST, ALT, ALKPHOS, PROT, ALBUMIN in the last 8760 hours.   Assessment and Plan:  65 y.o. male outpatient. History of DM, HLD, HTN,  known left sided ureteral stone.  CT abd pelvis from 8.18.22 reads Similar moderate left hydroureteronephrosis to the level of a 12 mm stone near the UPJ. Multiple additional non obstructing left renal calculi measuring up to 8 mm. Team is requesting a left sided nephrostomy tube placement for access for scheduled nephrolithotomy.   All labs and medications are within acceptable parameters. No pertinent allergies. NPO since midnight.  Risks and benefits of left sided PCN placement was discussed with the patient including, but not limited to, infection, bleeding, significant bleeding causing loss or decrease in renal function or damage to adjacent structures.   All of the patient's questions were answered, patient is agreeable to proceed.  Consent signed and in chart.    Thank you for this interesting consult.  I greatly enjoyed meeting Alexiz Sustaita Bachmeier and look forward to participating in their care.  A copy of this report was sent to the requesting provider on this date.  Electronically Signed: Alene Mires, NP 12/29/2020, 8:24 AM   I spent a total of  30 Minutes   in face to face in clinical consultation, greater than 50% of which was counseling/coordinating care for left nephrostomy tube placement

## 2020-12-29 NOTE — Transfer of Care (Signed)
Immediate Anesthesia Transfer of Care Note  Patient: Steven Contreras  Procedure(s) Performed: LEFT NEPHROLITHOTOMY PERCUTANEOUS (Left)  Patient Location: PACU  Anesthesia Type:General  Level of Consciousness: drowsy and patient cooperative  Airway & Oxygen Therapy: Patient Spontanous Breathing and Patient connected to face mask oxygen  Post-op Assessment: Report given to RN and Post -op Vital signs reviewed and stable  Post vital signs: Reviewed and stable  Last Vitals:  Vitals Value Taken Time  BP 152/81 12/29/20 1354  Temp    Pulse 70 12/29/20 1356  Resp 12 12/29/20 1356  SpO2 98 % 12/29/20 1356  Vitals shown include unvalidated device data.  Last Pain:  Vitals:   12/29/20 0823  TempSrc: Oral  PainSc: 0-No pain      Patients Stated Pain Goal: 4 (12/29/20 5364)  Complications: No notable events documented.

## 2020-12-30 ENCOUNTER — Encounter (HOSPITAL_COMMUNITY): Payer: Self-pay | Admitting: Urology

## 2020-12-30 DIAGNOSIS — N132 Hydronephrosis with renal and ureteral calculous obstruction: Secondary | ICD-10-CM | POA: Diagnosis not present

## 2020-12-30 LAB — BASIC METABOLIC PANEL
Anion gap: 7 (ref 5–15)
BUN: 20 mg/dL (ref 8–23)
CO2: 28 mmol/L (ref 22–32)
Calcium: 9.8 mg/dL (ref 8.9–10.3)
Chloride: 103 mmol/L (ref 98–111)
Creatinine, Ser: 0.85 mg/dL (ref 0.61–1.24)
GFR, Estimated: >60 mL/min (ref >60)
Glucose, Bld: 206 mg/dL — ABNORMAL HIGH (ref 70–99)
Potassium: 4.1 mmol/L (ref 3.5–5.1)
Sodium: 138 mmol/L (ref 135–145)

## 2020-12-30 LAB — GLUCOSE, CAPILLARY: Glucose-Capillary: 188 mg/dL — ABNORMAL HIGH (ref 70–99)

## 2020-12-30 LAB — HIV ANTIBODY (ROUTINE TESTING W REFLEX): HIV Screen 4th Generation wRfx: NONREACTIVE

## 2020-12-30 LAB — HEMOGLOBIN AND HEMATOCRIT, BLOOD
HCT: 41.1 % (ref 39.0–52.0)
Hemoglobin: 13.5 g/dL (ref 13.0–17.0)

## 2020-12-30 MED ORDER — OXYCODONE HCL 5 MG PO TABS: 5.0000 mg | ORAL_TABLET | ORAL | 0 refills | Status: DC | PRN

## 2020-12-30 MED ORDER — SENNA 8.6 MG PO TABS: 1.0000 | ORAL_TABLET | Freq: Two times a day (BID) | ORAL | 0 refills | Status: DC

## 2020-12-30 NOTE — Progress Notes (Signed)
Patient voiding 100-300 ml at a time. Bladder scan showing 15ml approx 10 min after last void. Patient states he is ready for discharge.

## 2020-12-30 NOTE — Discharge Summary (Signed)
Date of admission: 12/29/2020  Date of discharge: 12/30/2020  Admission diagnosis: Left renal calculus/ureteropelvic junction calculus with hydronephrosis and obstruction  Discharge diagnosis: Left renal calculus/ureteropelvic junction calculus with hydronephrosis and obstruction  Secondary diagnoses:  Patient Active Problem List   Diagnosis Date Noted   Renal calculi 12/29/2020   Lumbar radiculopathy 12/03/2020   Obesity 12/03/2020   Elevated coronary artery calcium score 06/11/2020   Coronary artery disease involving native coronary artery of native heart without angina pectoris 06/11/2020   Abnormal stress test 03/10/2020   Essential hypertension 02/22/2020   Exertional dyspnea 02/22/2020   Hypertensive heart disease without heart failure  02/22/2020   Screening for cardiovascular condition 02/16/2020   Mixed hyperlipidemia 02/16/2020   Chronic cough 09/07/2018   Other allergic rhinitis 09/07/2018   Somatic dysfunction of right sacroiliac joint 05/01/2018   Arthritis of right sacroiliac joint 05/01/2018   Nonallopathic lesion of thoracic region 05/01/2018   Nonallopathic lesion of lumbosacral region 05/01/2018   Nonallopathic lesion of rib cage 05/01/2018   Nonallopathic lesion of cervical region 05/01/2018   Chronic back pain 12/20/2017   Pain in hip region after total hip replacement ([place]) 12/20/2017   S/P right THA, AA 10/08/2014    Procedures performed: Procedure(s): LEFT NEPHROLITHOTOMY PERCUTANEOUS  History and Physical: For full details, please see admission history and physical. Briefly, Steven Contreras is a 65 y.o. year old patient with Left renal calculus/ureteropelvic junction calculus with hydronephrosis and obstruction who underwent the above procedure.   Hospital Course: Patient tolerated the procedure well.  He was then transferred to the floor after an uneventful PACU stay.  His hospital course was uncomplicated.  On POD#1 he had met discharge criteria: was  eating a regular diet, was up and ambulating independently,  pain was well controlled, was voiding without a catheter, and was ready to for discharge.   Laboratory values:  Recent Labs    12/29/20 0839 12/29/20 1433 12/30/20 0454  WBC 8.5  --   --   HGB 15.3 14.4 13.5  HCT 44.7 44.4 41.1   Recent Labs    12/29/20 1433 12/30/20 0454  NA 141 138  K 4.7 4.1  CL 104 103  CO2 30 28  GLUCOSE 179* 206*  BUN 20 20  CREATININE 1.04 0.85  CALCIUM 9.7 9.8   Recent Labs    12/29/20 0839  INR 0.9   No results for input(s): LABURIN in the last 72 hours. Results for orders placed or performed in visit on 12/25/20  SARS Coronavirus 2 (TAT 6-24 hrs)     Status: None   Collection Time: 12/25/20 12:00 AM  Result Value Ref Range Status   SARS Coronavirus 2 RESULT: NEGATIVE  Final    Comment: RESULT: NEGATIVESARS-CoV-2 INTERPRETATION:A NEGATIVE  test result means that SARS-CoV-2 RNA was not present in the specimen above the limit of detection of this test. This does not preclude a possible SARS-CoV-2 infection and should not be used as the  sole basis for patient management decisions. Negative results must be combined with clinical observations, patient history, and epidemiological information. Optimum specimen types and timing for peak viral levels during infections caused by SARS-CoV-2  have not been determined. Collection of multiple specimens or types of specimens may be necessary to detect virus. Improper specimen collection and handling, sequence variability under primers/probes, or organism present below the limit of detection may  lead to false negative results. Positive and negative predictive values of testing are highly dependent on prevalence. False negative  test results are more likely when prevalence of disease is high.The expected result is NEGATIVE.Fact S heet for  Healthcare Providers: LocalChronicle.no Sheet for Patients:  SalonLookup.es Reference Range - Negative     Disposition: Home  Discharge instruction: The patient was instructed to be ambulatory but told to refrain from heavy lifting, strenuous activity, or driving.  Discharge medications:  Allergies as of 12/30/2020       Reactions   Ace Inhibitors Cough   Ampicillin    NOT sure if its ampicillin or amoxicillin---stomach cramps   Bupropion Other (See Comments)   Unknown reaction   Venlafaxine Other (See Comments)   Unknown reaction         Medication List     TAKE these medications    amLODipine 10 MG tablet Commonly known as: NORVASC Take 10 mg by mouth daily.   aspirin EC 81 MG tablet Take 81 mg by mouth daily. Swallow whole.   citalopram 40 MG tablet Commonly known as: CELEXA Take 40 mg by mouth daily.   Dialyvite Vitamin D 5000 125 MCG (5000 UT) capsule Generic drug: Cholecalciferol Take 10,000 Units by mouth every evening.   etodolac 400 MG tablet Commonly known as: LODINE Take 400 mg by mouth 2 (two) times daily.   furosemide 40 MG tablet Commonly known as: LASIX Take 40 mg by mouth daily.   gabapentin 100 MG capsule Commonly known as: NEURONTIN Take two capsules (total 273m) daily at bedtime   Gold Bond Eczema Relief 2 % Crea Generic drug: Colloidal Oatmeal Apply 1 application topically daily as needed (eczema).   Lantus SoloStar 100 UNIT/ML Solostar Pen Generic drug: insulin glargine Inject 20 Units into the skin daily.   loratadine 10 MG tablet Commonly known as: CLARITIN Take 10 mg by mouth daily as needed for allergies.   losartan 50 MG tablet Commonly known as: COZAAR Take 1 tablet (50 mg total) by mouth daily.   metFORMIN 500 MG tablet Commonly known as: GLUCOPHAGE Take 1,000 mg by mouth 2 (two) times daily.   nebivolol 5 MG tablet Commonly known as: BYSTOLIC Take 2 tablets (10 mg total) by mouth daily. What changed: how much to take   oxyCODONE 5 MG  immediate release tablet Commonly known as: Oxy IR/ROXICODONE Take 1 tablet (5 mg total) by mouth every 4 (four) hours as needed for up to 7 doses for moderate pain or severe pain.   polyvinyl alcohol 1.4 % ophthalmic solution Commonly known as: LIQUIFILM TEARS Place 1 drop into both eyes 4 (four) times daily as needed for dry eyes.   potassium gluconate 595 (99 K) MG Tabs tablet Take 595 mg by mouth daily.   predniSONE 20 MG tablet Commonly known as: DELTASONE Take 1 tablet (20 mg total) by mouth daily with breakfast.   rosuvastatin 20 MG tablet Commonly known as: CRESTOR Take 1 tablet (20 mg total) by mouth daily.   senna 8.6 MG Tabs tablet Commonly known as: SENOKOT Take 1 tablet (8.6 mg total) by mouth 2 (two) times daily.   zolpidem 10 MG tablet Commonly known as: AMBIEN Take 10 mg by mouth at bedtime as needed for sleep.        Followup: Follow up in 2 weeks for local stent removal

## 2021-01-22 NOTE — Progress Notes (Signed)
[facility] [address] [place] [address] Phone: [phone] Subjective:   Steven Contreras, am serving as a scribe for Dr. Antoine Primas. This visit occurred during the SARS-CoV-2 public health emergency.  Safety protocols were in place, including screening questions prior to the visit, additional usage of staff PPE, and extensive cleaning of exam room while observing appropriate contact time as indicated for disinfecting solutions.   I'm seeing this patient by the request  of:  [name], MD  CC: Back pain follow-up  [id number]  12/24/2020 Patient does have some mild numbness and tingling in the thigh at this point.  Patient does back pain is completely improved at the moment.  Patient is undergoing removal of a kidney stone next week.  Do believe that it is a potential of the contributing to some of the discomfort and pain he was having.  Patient has been doing well though with the patient.  Did have some elevation in the blood pressure and blood glucose.  Follow-up again in 4 to 6 weeks  Update 01/23/2021 Steven Contreras is a 65 y.o. male coming in with complaint of lumbar spine pain. Had surgery to remove kidney stones on 12/29/2020. Patient states since surgery his back has gotten much better. He has been doing recovery yoga which has helped his overall aches and pains. Only complaint today is that he's feeling depleated during the day. By noon he has little to no energy.   Patient's laboratory work-up doing fine and patient did have a low vitamin D level and PTH was unremarkable but has had elevation in calcium previously.    Past Medical History:  Diagnosis Date   ADD (attention deficit disorder)    Anxiety    hx of   Arthritis    Diabetes (HCC)    Eczema    Family history of adverse reaction to anesthesia    mother slow to awaken   Headache    High cholesterol    History of kidney stones    Hypertension    Right rotator cuff  tear    Past Surgical History:  Procedure Laterality Date   colonscopy  2014   IR URETERAL STENT LEFT NEW ACCESS W/O SEP NEPHROSTOMY CATH  12/29/2020   NEPHROLITHOTOMY Left 12/29/2020   Procedure: LEFT NEPHROLITHOTOMY PERCUTANEOUS;  Surgeon: Crista Elliot, MD;  Location: [facility];  Service: Urology;  Laterality: Left;   SHOULDER SURGERY Left years ago   rotator, bicep and clavicle repair.   TOTAL HIP ARTHROPLASTY Right 10/08/2014   Procedure: RIGHT TOTAL HIP ARTHROPLASTY ANTERIOR APPROACH;  Surgeon: Durene Romans, MD;  Location: [facility];  Service: Orthopedics;  Laterality: Right;   Social History   Socioeconomic History   Marital status: Married    Spouse name: Not on file   Number of children: 3   Years of education: Not on file   Highest education level: Not on file  Occupational History   Not on file  Tobacco Use   Smoking status: Former    Packs/day: 0.50    Years: 15.00    Pack years: 7.50    Types: Cigarettes    Quit date: 05/03/2006    Years since quitting: 14.7   Smokeless tobacco: Never  Vaping Use   Vaping Use: Former  Substance and Sexual Activity   Alcohol use: Yes    Comment: rarely   Drug use: Never   Sexual activity: Not on file  Other Topics Concern  Not on file  Social History Narrative   Not on file   Social Determinants of Health   Financial Resource Strain: Not on file  Food Insecurity: Not on file  Transportation Needs: Not on file  Physical Activity: Not on file  Stress: Not on file  Social Connections: Not on file   Allergies  Allergen Reactions   Ace Inhibitors Cough   Ampicillin     NOT sure if its ampicillin or amoxicillin---stomach cramps   Bupropion Other (See Comments)    Unknown reaction   Venlafaxine Other (See Comments)    Unknown reaction    Family History  Problem Relation Age of Onset   Pancreatic cancer Mother    Prostate cancer Father    Allergic rhinitis Sister     Current Outpatient Medications (Endocrine &  Metabolic):    insulin glargine (LANTUS SOLOSTAR) 100 UNIT/ML Solostar Pen, Inject 20 Units into the skin daily.   metFORMIN (GLUCOPHAGE) 500 MG tablet, Take 1,000 mg by mouth 2 (two) times daily.   predniSONE (DELTASONE) 20 MG tablet, Take 1 tablet (20 mg total) by mouth daily with breakfast.  Current Outpatient Medications (Cardiovascular):    amLODipine (NORVASC) 10 MG tablet, Take 10 mg by mouth daily.   furosemide (LASIX) 40 MG tablet, Take 40 mg by mouth daily.   losartan (COZAAR) 50 MG tablet, Take 1 tablet (50 mg total) by mouth daily. (Patient not taking: No sig reported)   nebivolol (BYSTOLIC) 5 MG tablet, Take 2 tablets (10 mg total) by mouth daily. (Patient taking differently: Take 5 mg by mouth daily.)   rosuvastatin (CRESTOR) 20 MG tablet, Take 1 tablet (20 mg total) by mouth daily.  Current Outpatient Medications (Respiratory):    loratadine (CLARITIN) 10 MG tablet, Take 10 mg by mouth daily as needed for allergies.  Current Outpatient Medications (Analgesics):    aspirin EC 81 MG tablet, Take 81 mg by mouth daily. Swallow whole.   etodolac (LODINE) 400 MG tablet, Take 400 mg by mouth 2 (two) times daily.   oxyCODONE (OXY IR/ROXICODONE) 5 MG immediate release tablet, Take 1 tablet (5 mg total) by mouth every 4 (four) hours as needed for up to 7 doses for moderate pain or severe pain.   Current Outpatient Medications (Other):    Cholecalciferol (DIALYVITE VITAMIN D 5000) 125 MCG (5000 UT) capsule, Take 10,000 Units by mouth every evening.   citalopram (CELEXA) 40 MG tablet, Take 40 mg by mouth daily.   Colloidal Oatmeal (GOLD BOND ECZEMA RELIEF) 2 % CREA, Apply 1 application topically daily as needed (eczema).   gabapentin (NEURONTIN) 100 MG capsule, Take two capsules (total 200mg ) daily at bedtime   polyvinyl alcohol (LIQUIFILM TEARS) 1.4 % ophthalmic solution, Place 1 drop into both eyes 4 (four) times daily as needed for dry eyes.   potassium gluconate 595 (99 K) MG TABS  tablet, Take 595 mg by mouth daily.   senna (SENOKOT) 8.6 MG TABS tablet, Take 1 tablet (8.6 mg total) by mouth 2 (two) times daily.   zolpidem (AMBIEN) 10 MG tablet, Take 10 mg by mouth at bedtime as needed for sleep.   Reviewed prior external information including notes and imaging from  primary care provider this also includes patient's most recent surgery for the kidney stones. As well as notes that were available from care everywhere and other healthcare systems.  Past medical history, social, surgical and family history all reviewed in electronic medical record.  No pertanent information unless stated regarding to  the chief complaint.   Review of Systems:  No headache, visual changes, nausea, vomiting, diarrhea, constipation, dizziness, abdominal pain, skin rash, fevers, chills, night sweats, weight loss, swollen lymph nodes, body aches, joint swelling, chest pain, shortness of breath, mood changes. POSITIVE muscle aches not improving  Objective  Blood pressure (!) 158/84, pulse 84, height 5\' 8"  (1.727 m), weight 249 lb (112.9 kg), SpO2 97 %.   General: No apparent distress alert and oriented x3 mood and affect normal, dressed appropriately.  HEENT: Pupils equal, extraocular movements intact  Respiratory: Patient's speak in full sentences and does not appear short of breath  Cardiovascular: No lower extremity edema, non tender, no erythema  Gait normal with good balance and coordination.  MSK: Tightness in the lower back but no significant pain at the moment.  Sitting fairly comfortably.  No CVA tenderness.    Impression and Recommendations:     The above documentation has been reviewed and is accurate and complete , DO

## 2021-01-23 ENCOUNTER — Encounter: Payer: Self-pay | Admitting: Family Medicine

## 2021-01-23 ENCOUNTER — Other Ambulatory Visit: Payer: Self-pay

## 2021-01-23 ENCOUNTER — Ambulatory Visit (INDEPENDENT_AMBULATORY_CARE_PROVIDER_SITE_OTHER): Payer: Medicare Other | Admitting: Family Medicine

## 2021-01-23 VITALS — BP 158/84 | HR 84 | Ht 68.0 in | Wt 249.0 lb

## 2021-01-23 DIAGNOSIS — M47818 Spondylosis without myelopathy or radiculopathy, sacral and sacrococcygeal region: Secondary | ICD-10-CM | POA: Diagnosis not present

## 2021-01-23 DIAGNOSIS — R635 Abnormal weight gain: Secondary | ICD-10-CM | POA: Diagnosis not present

## 2021-01-23 DIAGNOSIS — M2550 Pain in unspecified joint: Secondary | ICD-10-CM | POA: Diagnosis not present

## 2021-01-23 DIAGNOSIS — E559 Vitamin D deficiency, unspecified: Secondary | ICD-10-CM

## 2021-01-23 DIAGNOSIS — R931 Abnormal findings on diagnostic imaging of heart and coronary circulation: Secondary | ICD-10-CM

## 2021-01-23 NOTE — Patient Instructions (Addendum)
Walk in the AM is a good idea (EAT SOMETHING IN THE MORNING) In 4 weeks come in for labs Dhea 50mg  daily for 4 weeks Update me 3-4 weeks  See you again in 6-8 weeks

## 2021-01-23 NOTE — Assessment & Plan Note (Signed)
Patient has known arthritic changes of the sacroiliac joint.  We discussed with patient about icing regimen and home exercises.  Do feel that the back pain was more secondary to the kidney stones and is improving.  Patient now would like to know why the kidney stones happen.  We will get laboratory work-up again to see if calcium stays low or if anything else needs to be done.  Patient has had some increasing fatigue recently and we did discuss the potential for mild DHEA.  Discussed with patient that if this continues to please follow-up with primary care provider as well.  We will continue to look after his low back pain.

## 2021-01-29 ENCOUNTER — Encounter: Payer: Self-pay | Admitting: Family Medicine

## 2021-01-29 NOTE — Telephone Encounter (Signed)
From patient.

## 2021-02-03 ENCOUNTER — Other Ambulatory Visit: Payer: Self-pay

## 2021-02-03 ENCOUNTER — Other Ambulatory Visit (INDEPENDENT_AMBULATORY_CARE_PROVIDER_SITE_OTHER): Payer: Medicare Other

## 2021-02-03 DIAGNOSIS — M2550 Pain in unspecified joint: Secondary | ICD-10-CM

## 2021-02-03 DIAGNOSIS — R635 Abnormal weight gain: Secondary | ICD-10-CM | POA: Diagnosis not present

## 2021-02-03 DIAGNOSIS — E559 Vitamin D deficiency, unspecified: Secondary | ICD-10-CM

## 2021-02-03 LAB — COMPREHENSIVE METABOLIC PANEL
ALT: 41 U/L (ref 0–53)
AST: 32 U/L (ref 0–37)
Albumin: 4.7 g/dL (ref 3.5–5.2)
Alkaline Phosphatase: 61 U/L (ref 39–117)
BUN: 14 mg/dL (ref 6–23)
CO2: 25 mEq/L (ref 19–32)
Calcium: 10 mg/dL (ref 8.4–10.5)
Chloride: 100 mEq/L (ref 96–112)
Creatinine, Ser: 0.91 mg/dL (ref 0.40–1.50)
GFR: 88.08 mL/min (ref >60.00)
Glucose, Bld: 170 mg/dL — ABNORMAL HIGH (ref 70–99)
Potassium: 3.6 mEq/L (ref 3.5–5.1)
Sodium: 137 mEq/L (ref 135–145)
Total Bilirubin: 0.7 mg/dL (ref 0.2–1.2)
Total Protein: 7.5 g/dL (ref 6.0–8.3)

## 2021-02-03 LAB — URIC ACID: Uric Acid, Serum: 2.7 mg/dL — ABNORMAL LOW (ref 4.0–7.8)

## 2021-02-03 LAB — TESTOSTERONE: Testosterone: 218.68 ng/dL — ABNORMAL LOW (ref 300.00–890.00)

## 2021-02-03 LAB — SEDIMENTATION RATE: Sed Rate: 18 mm/hr (ref 0–20)

## 2021-02-03 LAB — VITAMIN D 25 HYDROXY (VIT D DEFICIENCY, FRACTURES): VITD: 65.06 ng/mL (ref 30.00–100.00)

## 2021-02-03 LAB — TSH: TSH: 1.6 u[IU]/mL (ref 0.35–5.50)

## 2021-02-05 LAB — PTH, INTACT AND CALCIUM
Calcium: 10.3 mg/dL (ref 8.6–10.3)
PTH: 33 pg/mL (ref 16–77)

## 2021-02-05 LAB — CALCIUM, IONIZED: Calcium, Ion: 5.21 mg/dL (ref 4.8–5.6)

## 2021-03-03 ENCOUNTER — Other Ambulatory Visit: Payer: Self-pay | Admitting: Cardiology

## 2021-03-03 ENCOUNTER — Telehealth: Payer: Medicare Other | Admitting: Adult Health

## 2021-03-03 DIAGNOSIS — E782 Mixed hyperlipidemia: Secondary | ICD-10-CM

## 2021-03-06 ENCOUNTER — Ambulatory Visit: Payer: Medicare Other | Admitting: Family Medicine

## 2021-03-09 NOTE — Progress Notes (Signed)
[name] [address] [place] [address] Phone: [phone] Subjective:   Steven Contreras, am serving as a scribe for Dr. Antoine Primas. This visit occurred during the SARS-CoV-2 public health emergency.  Safety protocols were in place, including screening questions prior to the visit, additional usage of staff PPE, and extensive cleaning of exam room while observing appropriate contact time as indicated for disinfecting solutions.   I'm seeing this patient by the request  of:  [name], MD  CC: Back pain, hip pain follow-up  [id number]  01/23/2021 Patient has known arthritic changes of the sacroiliac joint.  We discussed with patient about icing regimen and home exercises.  Do feel that the back pain was more secondary to the kidney stones and is improving.  Patient now would like to know why the kidney stones happen.  We will get laboratory work-up again to see if calcium stays low or if anything else needs to be done.  Patient has had some increasing fatigue recently and we did discuss the potential for mild DHEA.  Discussed with patient that if this continues to please follow-up with primary care provider as well.  We will continue to look after his low back pain.  Update 03/10/2021 Steven Contreras is a 65 y.o. male coming in with complaint of SI joint pain. Patient states hes doing well. Progressively getting better. The recovery yoga is really helping him. Wants to talk about labs that were taken recently. No new complaints.  Patient feels like he is making progress.  Was found initially to have low testosterone.  The patient has been doing DHEA.  Feels like he is feeling better and has noticed weight loss recently.       Past Medical History:  Diagnosis Date   ADD (attention deficit disorder)    Anxiety    hx of   Arthritis    Diabetes (HCC)    Eczema    Family history of adverse reaction to anesthesia    mother slow to awaken    Headache    High cholesterol    History of kidney stones    Hypertension    Right rotator cuff tear    Past Surgical History:  Procedure Laterality Date   colonscopy  2014   IR URETERAL STENT LEFT NEW ACCESS W/O SEP NEPHROSTOMY CATH  12/29/2020   NEPHROLITHOTOMY Left 12/29/2020   Procedure: LEFT NEPHROLITHOTOMY PERCUTANEOUS;  Surgeon: Crista Elliot, MD;  Location: [facility];  Service: Urology;  Laterality: Left;   SHOULDER SURGERY Left years ago   rotator, bicep and clavicle repair.   TOTAL HIP ARTHROPLASTY Right 10/08/2014   Procedure: RIGHT TOTAL HIP ARTHROPLASTY ANTERIOR APPROACH;  Surgeon: Durene Romans, MD;  Location: [facility];  Service: Orthopedics;  Laterality: Right;   Social History   Socioeconomic History   Marital status: Married    Spouse name: Not on file   Number of children: 3   Years of education: Not on file   Highest education level: Not on file  Occupational History   Not on file  Tobacco Use   Smoking status: Former    Packs/day: 0.50    Years: 15.00    Pack years: 7.50    Types: Cigarettes    Quit date: 05/03/2006    Years since quitting: 14.8   Smokeless tobacco: Never  Vaping Use   Vaping Use: Former  Substance and Sexual Activity   Alcohol use: Yes  Comment: rarely   Drug use: Never   Sexual activity: Not on file  Other Topics Concern   Not on file  Social History Narrative   Not on file   Social Determinants of Health   Financial Resource Strain: Not on file  Food Insecurity: Not on file  Transportation Needs: Not on file  Physical Activity: Not on file  Stress: Not on file  Social Connections: Not on file   Allergies  Allergen Reactions   Ace Inhibitors Cough   Ampicillin     NOT sure if its ampicillin or amoxicillin---stomach cramps   Bupropion Other (See Comments)    Unknown reaction   Venlafaxine Other (See Comments)    Unknown reaction    Family History  Problem Relation Age of Onset   Pancreatic cancer Mother    Prostate  cancer Father    Allergic rhinitis Sister     Current Outpatient Medications (Endocrine & Metabolic):    insulin glargine (LANTUS SOLOSTAR) 100 UNIT/ML Solostar Pen, Inject 20 Units into the skin daily.   metFORMIN (GLUCOPHAGE) 500 MG tablet, Take 1,000 mg by mouth 2 (two) times daily.   predniSONE (DELTASONE) 20 MG tablet, Take 1 tablet (20 mg total) by mouth daily with breakfast.  Current Outpatient Medications (Cardiovascular):    amLODipine (NORVASC) 10 MG tablet, Take 10 mg by mouth daily.   furosemide (LASIX) 40 MG tablet, Take 40 mg by mouth daily.   losartan (COZAAR) 50 MG tablet, Take 1 tablet (50 mg total) by mouth daily. (Patient not taking: No sig reported)   nebivolol (BYSTOLIC) 5 MG tablet, Take 2 tablets (10 mg total) by mouth daily. (Patient taking differently: Take 5 mg by mouth daily.)   rosuvastatin (CRESTOR) 20 MG tablet, TAKE 1 TABLET BY MOUTH EVERY DAY  Current Outpatient Medications (Respiratory):    loratadine (CLARITIN) 10 MG tablet, Take 10 mg by mouth daily as needed for allergies.  Current Outpatient Medications (Analgesics):    aspirin EC 81 MG tablet, Take 81 mg by mouth daily. Swallow whole.   etodolac (LODINE) 400 MG tablet, Take 400 mg by mouth 2 (two) times daily.   oxyCODONE (OXY IR/ROXICODONE) 5 MG immediate release tablet, Take 1 tablet (5 mg total) by mouth every 4 (four) hours as needed for up to 7 doses for moderate pain or severe pain.   Current Outpatient Medications (Other):    Cholecalciferol (DIALYVITE VITAMIN D 5000) 125 MCG (5000 UT) capsule, Take 10,000 Units by mouth every evening.   citalopram (CELEXA) 40 MG tablet, Take 40 mg by mouth daily.   Colloidal Oatmeal (GOLD BOND ECZEMA RELIEF) 2 % CREA, Apply 1 application topically daily as needed (eczema).   gabapentin (NEURONTIN) 100 MG capsule, Take two capsules (total 200mg ) daily at bedtime   polyvinyl alcohol (LIQUIFILM TEARS) 1.4 % ophthalmic solution, Place 1 drop into both eyes 4  (four) times daily as needed for dry eyes.   potassium gluconate 595 (99 K) MG TABS tablet, Take 595 mg by mouth daily.   senna (SENOKOT) 8.6 MG TABS tablet, Take 1 tablet (8.6 mg total) by mouth 2 (two) times daily.   zolpidem (AMBIEN) 10 MG tablet, Take 10 mg by mouth at bedtime as needed for sleep.   Reviewed prior external information including notes and imaging from  primary care provider As well as notes that were available from care everywhere and other healthcare systems.  Past medical history, social, surgical and family history all reviewed in electronic medical record.  No pertanent information unless stated regarding to the chief complaint.   Review of Systems:  No headache, visual changes, nausea, vomiting, diarrhea, constipation, dizziness, abdominal pain, skin rash, fevers, chills, night sweats, weight loss, swollen lymph nodes, body aches, joint swelling, chest pain, shortness of breath, mood changes. POSITIVE muscle aches  Objective  Blood pressure 122/78, pulse 70, height 5\' 8"  (1.727 m), weight 245 lb (111.1 kg), SpO2 96 %.   General: No apparent distress alert and oriented x3 mood and affect normal, dressed appropriately.  HEENT: Pupils equal, extraocular movements intact  Respiratory: Patient's speak in full sentences and does not appear short of breath  Cardiovascular: No lower extremity edema, non tender, no erythema  Gait normal with good balance and coordination.  MSK: Sitting comfortably in the chair.  Patient has lost significant weight since last exam.  Patient still has some mild tightness with straight leg test but otherwise fairly unremarkable.    Impression and Recommendations:     The above documentation has been reviewed and is accurate and complete , DO

## 2021-03-10 ENCOUNTER — Telehealth: Payer: Self-pay | Admitting: Adult Health

## 2021-03-10 ENCOUNTER — Other Ambulatory Visit: Payer: Self-pay

## 2021-03-10 ENCOUNTER — Ambulatory Visit (INDEPENDENT_AMBULATORY_CARE_PROVIDER_SITE_OTHER): Payer: Medicare Other | Admitting: Family Medicine

## 2021-03-10 VITALS — BP 122/78 | HR 70 | Ht 68.0 in | Wt 245.0 lb

## 2021-03-10 DIAGNOSIS — R931 Abnormal findings on diagnostic imaging of heart and coronary circulation: Secondary | ICD-10-CM | POA: Diagnosis not present

## 2021-03-10 DIAGNOSIS — M2550 Pain in unspecified joint: Secondary | ICD-10-CM | POA: Diagnosis not present

## 2021-03-10 NOTE — Patient Instructions (Signed)
See me in 2-3 months

## 2021-05-08 NOTE — Progress Notes (Signed)
[facility] [address] [place] [address] Phone: [phone] Subjective:   Steven Contreras, am serving as a scribe for Dr. Antoine Primas. This visit occurred during the SARS-CoV-2 public health emergency.  Safety protocols were in place, including screening questions prior to the visit, additional usage of staff PPE, and extensive cleaning of exam room while observing appropriate contact time as indicated for disinfecting solutions.  I'm seeing this patient by the request  of:  [name], MD  CC: Low back pain follow-up  [id number]  Steven Contreras is a 66 y.o. male coming in with complaint of polyarthralgia and low back pain. Last seen 03/10/2021. Patient states that his neuropathy has not decreased since last visit. Pain was in R leg but is now in L leg and L SI joint. Patient said that aches prevent him from sleeping. Is doing therapeutic yoga which does help but he remains in pain. Also having B arm and hand tingling that also keep him awake at night. Using Lodine which takes edge off but is not working as well as it once did. Has increased supplement dosages DHEA, Zinc, Potassium, Magnesium.       Past Medical History:  Diagnosis Date   ADD (attention deficit disorder)    Anxiety    hx of   Arthritis    Diabetes (HCC)    Eczema    Family history of adverse reaction to anesthesia    mother slow to awaken   Headache    High cholesterol    History of kidney stones    Hypertension    Right rotator cuff tear    Past Surgical History:  Procedure Laterality Date   colonscopy  2014   IR URETERAL STENT LEFT NEW ACCESS W/O SEP NEPHROSTOMY CATH  12/29/2020   NEPHROLITHOTOMY Left 12/29/2020   Procedure: LEFT NEPHROLITHOTOMY PERCUTANEOUS;  Surgeon: Crista Elliot, MD;  Location: [facility];  Service: Urology;  Laterality: Left;   SHOULDER SURGERY Left years ago   rotator, bicep and clavicle repair.   TOTAL HIP ARTHROPLASTY Right 10/08/2014    Procedure: RIGHT TOTAL HIP ARTHROPLASTY ANTERIOR APPROACH;  Surgeon: Durene Romans, MD;  Location: [facility];  Service: Orthopedics;  Laterality: Right;   Social History   Socioeconomic History   Marital status: Married    Spouse name: Not on file   Number of children: 3   Years of education: Not on file   Highest education level: Not on file  Occupational History   Not on file  Tobacco Use   Smoking status: Former    Packs/day: 0.50    Years: 15.00    Pack years: 7.50    Types: Cigarettes    Quit date: 05/03/2006    Years since quitting: 15.0   Smokeless tobacco: Never  Vaping Use   Vaping Use: Former  Substance and Sexual Activity   Alcohol use: Yes    Comment: rarely   Drug use: Never   Sexual activity: Not on file  Other Topics Concern   Not on file  Social History Narrative   Not on file   Social Determinants of Health   Financial Resource Strain: Not on file  Food Insecurity: Not on file  Transportation Needs: Not on file  Physical Activity: Not on file  Stress: Not on file  Social Connections: Not on file   Allergies  Allergen Reactions   Ace Inhibitors Cough   Ampicillin     NOT sure  if its ampicillin or amoxicillin---stomach cramps   Bupropion Other (See Comments)    Unknown reaction   Venlafaxine Other (See Comments)    Unknown reaction    Family History  Problem Relation Age of Onset   Pancreatic cancer Mother    Prostate cancer Father    Allergic rhinitis Sister     Current Outpatient Medications (Endocrine & Metabolic):    insulin glargine (LANTUS SOLOSTAR) 100 UNIT/ML Solostar Pen, Inject 20 Units into the skin daily.   metFORMIN (GLUCOPHAGE) 500 MG tablet, Take 1,000 mg by mouth 2 (two) times daily.  Current Outpatient Medications (Cardiovascular):    amLODipine (NORVASC) 10 MG tablet, Take 10 mg by mouth daily.   furosemide (LASIX) 40 MG tablet, Take 40 mg by mouth daily.   nebivolol (BYSTOLIC) 5 MG tablet, Take 2 tablets (10 mg total) by  mouth daily. (Patient taking differently: Take 5 mg by mouth daily.)   rosuvastatin (CRESTOR) 20 MG tablet, TAKE 1 TABLET BY MOUTH EVERY DAY   losartan (COZAAR) 50 MG tablet, Take 1 tablet (50 mg total) by mouth daily. (Patient not taking: No sig reported)  Current Outpatient Medications (Respiratory):    loratadine (CLARITIN) 10 MG tablet, Take 10 mg by mouth daily as needed for allergies.  Current Outpatient Medications (Analgesics):    aspirin EC 81 MG tablet, Take 81 mg by mouth daily. Swallow whole.   etodolac (LODINE) 400 MG tablet, Take 400 mg by mouth 2 (two) times daily.   oxyCODONE (OXY IR/ROXICODONE) 5 MG immediate release tablet, Take 1 tablet (5 mg total) by mouth every 4 (four) hours as needed for up to 7 doses for moderate pain or severe pain.   Current Outpatient Medications (Other):    Cholecalciferol (DIALYVITE VITAMIN D 5000) 125 MCG (5000 UT) capsule, Take 10,000 Units by mouth every evening.   citalopram (CELEXA) 40 MG tablet, Take 40 mg by mouth daily.   Colloidal Oatmeal (GOLD BOND ECZEMA RELIEF) 2 % CREA, Apply 1 application topically daily as needed (eczema).   polyvinyl alcohol (LIQUIFILM TEARS) 1.4 % ophthalmic solution, Place 1 drop into both eyes 4 (four) times daily as needed for dry eyes.   potassium gluconate 595 (99 K) MG TABS tablet, Take 595 mg by mouth daily.   senna (SENOKOT) 8.6 MG TABS tablet, Take 1 tablet (8.6 mg total) by mouth 2 (two) times daily.   zolpidem (AMBIEN) 10 MG tablet, Take 10 mg by mouth at bedtime as needed for sleep.    Review of Systems:  No headache, visual changes, nausea, vomiting, diarrhea, constipation, dizziness, abdominal pain, skin rash, fevers, chills, night sweats, weight loss, swollen lymph nodes, body aches, joint swelling, chest pain, shortness of breath, mood changes. POSITIVE muscle aches  Objective  Blood pressure 138/82, pulse 84, height 5\' 8"  (1.727 m), weight 250 lb (113.4 kg), SpO2 96 %.   General: Alert and  oriented x3 but much more uncomfortable than previous exam HEENT: Pupils equal, extraocular movements intact  Respiratory: Patient's speak in full sentences and does not appear short of breath  Cardiovascular: No lower extremity edema, non tender, no erythema  Gait antalgic favoring the left hip Positive straight leg test on the left side.  Seems to be at 20 degrees.  Patient does have weakness with dorsiflexion on the left side as well with 3 out of 5 strength.    Impression and Recommendations:     The above documentation has been reviewed and is accurate and complete ,  DO

## 2021-05-12 ENCOUNTER — Encounter: Payer: Self-pay | Admitting: Family Medicine

## 2021-05-12 ENCOUNTER — Other Ambulatory Visit: Payer: Self-pay

## 2021-05-12 ENCOUNTER — Ambulatory Visit (INDEPENDENT_AMBULATORY_CARE_PROVIDER_SITE_OTHER): Payer: Medicare Other | Admitting: Family Medicine

## 2021-05-12 VITALS — BP 138/82 | HR 84 | Ht 68.0 in | Wt 250.0 lb

## 2021-05-12 DIAGNOSIS — M545 Low back pain, unspecified: Secondary | ICD-10-CM | POA: Diagnosis not present

## 2021-05-12 DIAGNOSIS — M2550 Pain in unspecified joint: Secondary | ICD-10-CM | POA: Diagnosis not present

## 2021-05-12 DIAGNOSIS — M5416 Radiculopathy, lumbar region: Secondary | ICD-10-CM | POA: Diagnosis not present

## 2021-05-12 LAB — URINALYSIS
Hgb urine dipstick: NEGATIVE
Ketones, ur: NEGATIVE
Leukocytes,Ua: NEGATIVE
Nitrite: NEGATIVE
Specific Gravity, Urine: 1.015 (ref 1.000–1.030)
Total Protein, Urine: NEGATIVE
Urine Glucose: 100 — AB
Urobilinogen, UA: 0.2 (ref 0.0–1.0)
pH: 6 (ref 5.0–8.0)

## 2021-05-12 NOTE — Patient Instructions (Signed)
MRI Lumbar [phone] Labs today Will write you results

## 2021-05-12 NOTE — Assessment & Plan Note (Signed)
Patient having worsening pain again.  Patient had this severe abdominal pain patient was found to have kidney stones.  We will get a urinalysis today to make sure there is no type of infectious etiology that could be contributing.  In addition to this though I do think that advanced imaging of the lumbar spine is necessary.  Depending on how patient findings are we will suggest the possibility of injections.  Increase activity slowly otherwise.  Follow-up again in 6 to 8 weeks no matter what symptoms we will discuss otherwise.

## 2021-05-13 ENCOUNTER — Ambulatory Visit
Admission: RE | Admit: 2021-05-13 | Discharge: 2021-05-13 | Disposition: A | Discharge status: Home / Self Care | Payer: Medicare Other | Source: Ambulatory Visit | Attending: Family Medicine | Admitting: Family Medicine

## 2021-05-13 ENCOUNTER — Encounter: Payer: Self-pay | Admitting: Family Medicine

## 2021-05-13 ENCOUNTER — Other Ambulatory Visit: Payer: Self-pay

## 2021-05-13 DIAGNOSIS — M545 Low back pain, unspecified: Secondary | ICD-10-CM

## 2021-05-13 DIAGNOSIS — M5416 Radiculopathy, lumbar region: Secondary | ICD-10-CM

## 2021-05-14 ENCOUNTER — Encounter: Payer: Self-pay | Admitting: Family Medicine

## 2021-05-14 ENCOUNTER — Ambulatory Visit (INDEPENDENT_AMBULATORY_CARE_PROVIDER_SITE_OTHER): Payer: Medicare Other | Admitting: Family Medicine

## 2021-05-14 DIAGNOSIS — M5416 Radiculopathy, lumbar region: Secondary | ICD-10-CM

## 2021-05-14 NOTE — Progress Notes (Signed)
Virtual Visit via Video Note  I connected with Steven Contreras on 05/14/21 at 12:30 PM EST by a video enabled telemedicine application and verified that I am speaking with the correct person using two identifiers.  Patient is on this video visit so long.  Difficulty with visual so switched to phone call  Location: Patient: Patient is alone in his house Provider: In office setting   I discussed the limitations of evaluation and management by telemedicine and the availability of in person appointments. The patient expressed understanding and agreed to proceed.  History of Present Illness: Patient is a 66 year old male to have difficulty with back pain.  Patient had failed all conservative therapy and we did send for an MRI.  MRI was independently visualized by me showing that at multiple levels patient does have disc protrusions and nerve root impingement.  Worsening area seems to be more of the facet arthropathy at L4 4 and 5 with a left-sided L5 nerve root impingement.  Discussed with patient that I did not think an epidural would be beneficial.  Patient wanted a virtual visit to discuss further.  Patient states saw urologist and had clean bill of health. Still having pain. Still affecting daily activities, wants to know how it will affect him and what we are looking for in the injection and then next step  Patient also found to have hematuria noted in the urine again.   Observations/Objective: Alert and asked very good questions at the moment. Unable to see patient did not get on the virtual platform due to technical difficulties   Assessment and Plan: A 66 year old gentleman who does have an L4-L5 left-sided L5 nerve root impingement secondary to disc fragment as well as facet arthropathy.  Going to attempt an epidural to see if this will be beneficial.  Discussed with patient that I think possible injections are may be necessary for diagnostic and therapeutic purposes.  We discussed differential  for treatment options would also include surgical intervention which we may need to talk about.  Discussed with patient about this at great length.  Hopeful that patient will respond well and follow-up with me again 4 weeks after the injection but will right within 48 hours of the injection.   Follow Up Instructions: 4 weeks    I discussed the assessment and treatment plan with the patient. The patient was provided an opportunity to ask questions and all were answered. The patient agreed with the plan and demonstrated an understanding of the instructions.   The patient was advised to call back or seek an in-person evaluation if the symptoms worsen or if the condition fails to improve as anticipated.  I provided 31 minutes of non-face-to-face time during this encounter as well as reviewing patient's MRI.[name]   Lyndal Pulley, DO

## 2021-05-15 ENCOUNTER — Encounter: Payer: Self-pay | Admitting: Family Medicine

## 2021-05-19 ENCOUNTER — Ambulatory Visit
Admission: RE | Admit: 2021-05-19 | Discharge: 2021-05-19 | Disposition: A | Discharge status: Home / Self Care | Payer: Medicare Other | Source: Ambulatory Visit | Attending: Family Medicine | Admitting: Family Medicine

## 2021-05-19 ENCOUNTER — Other Ambulatory Visit: Payer: Self-pay

## 2021-05-19 DIAGNOSIS — M5416 Radiculopathy, lumbar region: Secondary | ICD-10-CM

## 2021-05-19 MED ORDER — IOPAMIDOL (ISOVUE-M 200) INJECTION 41%
1.0000 mL | Freq: Once | INTRAMUSCULAR | Status: AC
  Administered 2021-05-19: 1 mL via EPIDURAL

## 2021-05-19 MED ORDER — METHYLPREDNISOLONE ACETATE 40 MG/ML INJ SUSP (RADIOLOG
80.0000 mg | Freq: Once | INTRAMUSCULAR | Status: AC
  Administered 2021-05-19: 80 mg via EPIDURAL

## 2021-05-19 NOTE — Discharge Instructions (Signed)

## 2021-05-20 ENCOUNTER — Encounter: Payer: Self-pay | Admitting: Family Medicine

## 2021-05-26 ENCOUNTER — Encounter: Payer: Medicare Other | Admitting: Adult Health

## 2021-05-26 NOTE — Progress Notes (Incomplete)
PATIENT: Steven SalmonRobert K Contreras DOB: [birth year]  REASON FOR VISIT: follow up HISTORY FROM: patient PRIMARY NEUROLOGIST:   Virtual Visit via Video Note  I connected with Steven Salmonobert K Contreras on 05/26/21 at  1:45 PM EST by a video enabled telemedicine application [facility] and verified that I am speaking with the correct person using two identifiers who was located at their own home.   I discussed the limitations of evaluation and management by telemedicine and the availability of in person appointments. The patient expressed understanding and agreed to proceed.   PATIENT: Steven SalmonRobert K Contreras DOB: [birth year]  REASON FOR VISIT: follow up HISTORY FROM: patient  HISTORY OF PRESENT ILLNESS: Today 05/26/21  HISTORY   REVIEW OF SYSTEMS: Out of a complete 14 system review of symptoms, the patient complains only of the following symptoms, and all other reviewed systems are negative.  ALLERGIES: Allergies  Allergen Reactions   Ace Inhibitors Cough   Ampicillin     NOT sure if its ampicillin or amoxicillin---stomach cramps   Bupropion Other (See Comments)    Unknown reaction   Venlafaxine Other (See Comments)    Unknown reaction     HOME MEDICATIONS: Outpatient Medications Prior to Visit  Medication Sig Dispense Refill   amLODipine (NORVASC) 10 MG tablet Take 10 mg by mouth daily.     aspirin EC 81 MG tablet Take 81 mg by mouth daily. Swallow whole.     Cholecalciferol (DIALYVITE VITAMIN D 5000) 125 MCG (5000 UT) capsule Take 10,000 Units by mouth every evening.     citalopram (CELEXA) 40 MG tablet Take 40 mg by mouth daily.     Colloidal Oatmeal (GOLD BOND ECZEMA RELIEF) 2 % CREA Apply 1 application topically daily as needed (eczema).     etodolac (LODINE) 400 MG tablet Take 400 mg by mouth 2 (two) times daily.     furosemide (LASIX) 40 MG tablet Take 40 mg by mouth daily.     insulin glargine (LANTUS SOLOSTAR) 100 UNIT/ML Solostar Pen Inject 20 Units  into the skin daily.     loratadine (CLARITIN) 10 MG tablet Take 10 mg by mouth daily as needed for allergies.     losartan (COZAAR) 50 MG tablet Take 1 tablet (50 mg total) by mouth daily. (Patient not taking: No sig reported) 30 tablet 3   metFORMIN (GLUCOPHAGE) 500 MG tablet Take 1,000 mg by mouth 2 (two) times daily.     nebivolol (BYSTOLIC) 5 MG tablet Take 2 tablets (10 mg total) by mouth daily. (Patient taking differently: Take 5 mg by mouth daily.) 90 tablet 3   oxyCODONE (OXY IR/ROXICODONE) 5 MG immediate release tablet Take 1 tablet (5 mg total) by mouth every 4 (four) hours as needed for up to 7 doses for moderate pain or severe pain. 7 tablet 0   polyvinyl alcohol (LIQUIFILM TEARS) 1.4 % ophthalmic solution Place 1 drop into both eyes 4 (four) times daily as needed for dry eyes.     potassium gluconate 595 (99 K) MG TABS tablet Take 595 mg by mouth daily.     rosuvastatin (CRESTOR) 20 MG tablet TAKE 1 TABLET BY MOUTH EVERY DAY 90 tablet 3   senna (SENOKOT) 8.6 MG TABS tablet Take 1 tablet (8.6 mg total) by mouth 2 (two) times daily. 30 tablet 0   zolpidem (AMBIEN) 10 MG tablet Take 10 mg by mouth at bedtime as needed for sleep.     No facility-administered medications prior  to visit.    PAST MEDICAL HISTORY: Past Medical History:  Diagnosis Date   ADD (attention deficit disorder)    Anxiety    hx of   Arthritis    Diabetes (HCC)    Eczema    Family history of adverse reaction to anesthesia    mother slow to awaken   Headache    High cholesterol    History of kidney stones    Hypertension    Right rotator cuff tear     PAST SURGICAL HISTORY: Past Surgical History:  Procedure Laterality Date   colonscopy  2014   IR URETERAL STENT LEFT NEW ACCESS W/O SEP NEPHROSTOMY CATH  12/29/2020   NEPHROLITHOTOMY Left 12/29/2020   Procedure: LEFT NEPHROLITHOTOMY PERCUTANEOUS;  Surgeon: Crista Elliot, MD;  Location: [facility];  Service: Urology;  Laterality: Left;   SHOULDER  SURGERY Left years ago   rotator, bicep and clavicle repair.   TOTAL HIP ARTHROPLASTY Right 10/08/2014   Procedure: RIGHT TOTAL HIP ARTHROPLASTY ANTERIOR APPROACH;  Surgeon: Durene Romans, MD;  Location: [facility];  Service: Orthopedics;  Laterality: Right;    FAMILY HISTORY: Family History  Problem Relation Age of Onset   Pancreatic cancer Mother    Prostate cancer Father    Allergic rhinitis Sister     SOCIAL HISTORY: Social History   Socioeconomic History   Marital status: Married    Spouse name: Not on file   Number of children: 3   Years of education: Not on file   Highest education level: Not on file  Occupational History   Not on file  Tobacco Use   Smoking status: Former    Packs/day: 0.50    Years: 15.00    Pack years: 7.50    Types: Cigarettes    Quit date: 05/03/2006    Years since quitting: 15.0   Smokeless tobacco: Never  Vaping Use   Vaping Use: Former  Substance and Sexual Activity   Alcohol use: Yes    Comment: rarely   Drug use: Never   Sexual activity: Not on file  Other Topics Concern   Not on file  Social History Narrative   Not on file   Social Determinants of Health   Financial Resource Strain: Not on file  Food Insecurity: Not on file  Transportation Needs: Not on file  Physical Activity: Not on file  Stress: Not on file  Social Connections: Not on file  Intimate Partner Violence: Not on file      PHYSICAL EXAM Generalized: Well developed, in no acute distress   Neurological examination  Mentation: Alert oriented to time, place, history taking. Follows all commands speech and language fluent Cranial nerve II-XII:Extraocular movements were full. Facial symmetry noted. uvula tongue midline. Head turning and shoulder shrug  were normal and symmetric. Motor: Good strength throughout subjectively per patient Sensory: Sensory testing is intact to soft touch on all 4 extremities subjectively per patient Coordination: Cerebellar testing  reveals good finger-nose-finger  Gait and station: Patient is able to stand from a seated position. gait is normal.  Reflexes: UTA  DIAGNOSTIC DATA (LABS, IMAGING, TESTING) - I reviewed patient records, labs, notes, testing and imaging myself where available.  Lab Results  Component Value Date   WBC 8.5 12/29/2020   HGB 13.5 12/30/2020   HCT 41.1 12/30/2020   MCV 84.3 12/29/2020   PLT 223 12/29/2020      Component Value Date/Time   NA 137 02/03/2021 0916   NA 137 02/29/2020  0859   K 3.6 02/03/2021 0916   CL 100 02/03/2021 0916   CO2 25 02/03/2021 0916   GLUCOSE 170 (H) 02/03/2021 0916   BUN 14 02/03/2021 0916   BUN 12 02/29/2020 0859   CREATININE 0.91 02/03/2021 0916   CALCIUM 10.0 02/03/2021 0916   CALCIUM 10.3 02/03/2021 0916   PROT 7.5 02/03/2021 0916   ALBUMIN 4.7 02/03/2021 0916   AST 32 02/03/2021 0916   ALT 41 02/03/2021 0916   ALKPHOS 61 02/03/2021 0916   BILITOT 0.7 02/03/2021 0916   GFRNONAA >60 12/30/2020 0454   GFRAA 88 02/29/2020 0859   No results found for: CHOL, HDL, LDLCALC, LDLDIRECT, TRIG, CHOLHDL Lab Results  Component Value Date   HGBA1C 8.0 (H) 12/17/2020   No results found for: VITAMINB12 Lab Results  Component Value Date   TSH 1.60 02/03/2021      ASSESSMENT AND PLAN 66 y.o. year old male  has a past medical history of ADD (attention deficit disorder), Anxiety, Arthritis, Diabetes (HCC), Eczema, Family history of adverse reaction to anesthesia, Headache, High cholesterol, History of kidney stones, Hypertension, and Right rotator cuff tear. here with:  OSA on CPAP  CPAP compliance excellent Residual AHI is good Encouraged patient to continue using CPAP nightly and > 4 hours each night F/U in 1 year or sooner if needed  I spent 20 minutes of face-to-face and non-face-to-face time with patient.  This included previsit chart review, lab review, study review, order entry, electronic health record documentation, patient education.  Butch Penny, MSN, NP-C 05/26/2021, 1:49 PM [facility] [address], [address] [place], [place] [address] [phone]

## 2021-05-27 ENCOUNTER — Encounter: Payer: Self-pay | Admitting: Adult Health

## 2021-05-27 ENCOUNTER — Ambulatory Visit (INDEPENDENT_AMBULATORY_CARE_PROVIDER_SITE_OTHER): Payer: Medicare Other | Admitting: Adult Health

## 2021-05-27 VITALS — BP 155/88 | HR 70 | Ht 67.0 in | Wt 250.2 lb

## 2021-05-27 DIAGNOSIS — G4733 Obstructive sleep apnea (adult) (pediatric): Secondary | ICD-10-CM

## 2021-05-27 DIAGNOSIS — Z9989 Dependence on other enabling machines and devices: Secondary | ICD-10-CM

## 2021-05-27 NOTE — Progress Notes (Signed)
This encounter was created in error - please disregard.

## 2021-05-27 NOTE — Patient Instructions (Signed)
Continue using CPAP nightly and greater than 4 hours each night °If your symptoms worsen or you develop new symptoms please let us know.  ° °

## 2021-05-27 NOTE — Progress Notes (Signed)
PATIENT: Steven Contreras DOB: Oct 27, 1955  REASON FOR VISIT: follow up HISTORY FROM: patient PRIMARY NEUROLOGIST:   HISTORY OF PRESENT ILLNESS: Today 05/27/21:  Steven Contreras is a 66 year old male with a history of obstructive sleep apnea on CPAP.  He returns today for follow-up.  His download indicates that he does not use his machine much in the last 30 days.  The patient states that he has had cough and nasal congestion.  The patient also deals with spinal stenosis and sometimes falls asleep on couch if he is having a flareup.  A 90-day download indicated that he uses machine 28 out of 90 days for compliance of 31%.  On average he uses his machine 2 hours and 42 minutes.  His residual AHI is 2.1 on 7 to 14 cm of water.  He does notate that if he uses his machine he does feel better the next day.  Patient also reports that his PCP sent a referral to our office for paresthesias of the skin.  The patient has been seen by sports medicine for spinal stenosis.  Recently got an injection.  It sounds as if the cause of his paresthesias is spinal stenosis?    REVIEW OF SYSTEMS: Out of a complete 14 system review of symptoms, the patient complains only of the following symptoms, and all other reviewed systems are negative.  ESS 7  ALLERGIES: Allergies  Allergen Reactions   Ace Inhibitors Cough   Ampicillin     NOT sure if its ampicillin or amoxicillin---stomach cramps   Bupropion Other (See Comments)    Unknown reaction   Venlafaxine Other (See Comments)    Unknown reaction     HOME MEDICATIONS: Outpatient Medications Prior to Visit  Medication Sig Dispense Refill   amLODipine (NORVASC) 10 MG tablet Take 10 mg by mouth daily.     aspirin EC 81 MG tablet Take 81 mg by mouth daily. Swallow whole.     cholecalciferol (VITAMIN D3) 25 MCG (1000 UNIT) tablet Take 1,000 Units by mouth daily.     citalopram (CELEXA) 40 MG tablet Take 40 mg by mouth daily.     Colloidal Oatmeal (GOLD BOND  ECZEMA RELIEF) 2 % CREA Apply 1 application topically daily as needed (eczema).     etodolac (LODINE) 400 MG tablet Take 400 mg by mouth 2 (two) times daily.     furosemide (LASIX) 40 MG tablet Take 40 mg by mouth daily.     insulin glargine (LANTUS SOLOSTAR) 100 UNIT/ML Solostar Pen Inject 20 Units into the skin daily.     loratadine (CLARITIN) 10 MG tablet Take 10 mg by mouth daily as needed for allergies.     metFORMIN (GLUCOPHAGE) 500 MG tablet Take 1,000 mg by mouth 2 (two) times daily.     nebivolol (BYSTOLIC) 5 MG tablet Take 2 tablets (10 mg total) by mouth daily. (Patient taking differently: Take 5 mg by mouth daily.) 90 tablet 3   polyvinyl alcohol (LIQUIFILM TEARS) 1.4 % ophthalmic solution Place 1 drop into both eyes 4 (four) times daily as needed for dry eyes.     potassium gluconate 595 (99 K) MG TABS tablet Take 595 mg by mouth daily.     rosuvastatin (CRESTOR) 20 MG tablet TAKE 1 TABLET BY MOUTH EVERY DAY 90 tablet 3   TRULICITY [id number] 0000000 SOPN Inject into the skin.     zolpidem (AMBIEN) 10 MG tablet Take 10 mg by mouth at bedtime as needed for sleep.  losartan (COZAAR) 50 MG tablet Take 1 tablet (50 mg total) by mouth daily. (Patient not taking: No sig reported) 30 tablet 3   Cholecalciferol (DIALYVITE VITAMIN D 5000) 125 MCG (5000 UT) capsule Take 10,000 Units by mouth every evening.     oxyCODONE (OXY IR/ROXICODONE) 5 MG immediate release tablet Take 1 tablet (5 mg total) by mouth every 4 (four) hours as needed for up to 7 doses for moderate pain or severe pain. 7 tablet 0   senna (SENOKOT) 8.6 MG TABS tablet Take 1 tablet (8.6 mg total) by mouth 2 (two) times daily. 30 tablet 0   No facility-administered medications prior to visit.    PAST MEDICAL HISTORY: Past Medical History:  Diagnosis Date   ADD (attention deficit disorder)    Anxiety    hx of   Arthritis    Diabetes ([place])    Eczema    Family history of adverse reaction to anesthesia    mother slow to  awaken   Headache    High cholesterol    History of kidney stones    Hypertension    Right rotator cuff tear     PAST SURGICAL HISTORY: Past Surgical History:  Procedure Laterality Date   colonscopy  2014   IR URETERAL STENT LEFT NEW ACCESS W/O SEP NEPHROSTOMY CATH  12/29/2020   NEPHROLITHOTOMY Left 12/29/2020   Procedure: LEFT NEPHROLITHOTOMY PERCUTANEOUS;  Surgeon: Lucas Mallow, MD;  Location: [facility];  Service: Urology;  Laterality: Left;   SHOULDER SURGERY Left years ago   rotator, bicep and clavicle repair.   TOTAL HIP ARTHROPLASTY Right 10/08/2014   Procedure: RIGHT TOTAL HIP ARTHROPLASTY ANTERIOR APPROACH;  Surgeon: Paralee Cancel, MD;  Location: [facility];  Service: Orthopedics;  Laterality: Right;    FAMILY HISTORY: Family History  Problem Relation Age of Onset   Pancreatic cancer Mother    Prostate cancer Father    Allergic rhinitis Sister     SOCIAL HISTORY: Social History   Socioeconomic History   Marital status: Married    Spouse name: Not on file   Number of children: 3   Years of education: Not on file   Highest education level: Not on file  Occupational History   Not on file  Tobacco Use   Smoking status: Former    Packs/day: 0.50    Years: 15.00    Pack years: 7.50    Types: Cigarettes    Quit date: 05/03/2006    Years since quitting: 15.0   Smokeless tobacco: Never  Vaping Use   Vaping Use: Former  Substance and Sexual Activity   Alcohol use: Yes    Comment: rarely   Drug use: Never   Sexual activity: Not on file  Other Topics Concern   Not on file  Social History Narrative   Not on file   Social Determinants of Health   Financial Resource Strain: Not on file  Food Insecurity: Not on file  Transportation Needs: Not on file  Physical Activity: Not on file  Stress: Not on file  Social Connections: Not on file  Intimate Partner Violence: Not on file      PHYSICAL EXAM  Vitals:   05/27/21 0913  BP: (!) 155/88  Pulse: 70  Weight:  250 lb 3.2 oz (113.5 kg)  Height: 5\' 7"  (1.702 m)   Body mass index is 39.19 kg/m.  Generalized: Well developed, in no acute distress  Chest: Lungs clear to auscultation bilaterally  Neurological examination  Mentation:  Alert oriented to time, place, history taking. Follows all commands speech and language fluent Cranial nerve II-XII: Extraocular movements were full, visual field were full on confrontational test Head turning and shoulder shrug  were normal and symmetric. Motor: The motor testing reveals 5 over 5 strength of all 4 extremities. Good symmetric motor tone is noted throughout.  Sensory: Sensory testing is intact to soft touch on all 4 extremities. No evidence of extinction is noted.  Gait and station: Gait is normal.    DIAGNOSTIC DATA (LABS, IMAGING, TESTING) - I reviewed patient records, labs, notes, testing and imaging myself where available.  Lab Results  Component Value Date   WBC 8.5 12/29/2020   HGB 13.5 12/30/2020   HCT 41.1 12/30/2020   MCV 84.3 12/29/2020   PLT 223 12/29/2020      Component Value Date/Time   NA 137 02/03/2021 0916   NA 137 02/29/2020 0859   K 3.6 02/03/2021 0916   CL 100 02/03/2021 0916   CO2 25 02/03/2021 0916   GLUCOSE 170 (H) 02/03/2021 0916   BUN 14 02/03/2021 0916   BUN 12 02/29/2020 0859   CREATININE 0.91 02/03/2021 0916   CALCIUM 10.0 02/03/2021 0916   CALCIUM 10.3 02/03/2021 0916   PROT 7.5 02/03/2021 0916   ALBUMIN 4.7 02/03/2021 0916   AST 32 02/03/2021 0916   ALT 41 02/03/2021 0916   ALKPHOS 61 02/03/2021 0916   BILITOT 0.7 02/03/2021 0916   GFRNONAA >60 12/30/2020 0454   GFRAA 88 02/29/2020 0859   No results found for: CHOL, HDL, LDLCALC, LDLDIRECT, TRIG, CHOLHDL Lab Results  Component Value Date   HGBA1C 8.0 (H) 12/17/2020   No results found for: VITAMINB12 Lab Results  Component Value Date   TSH 1.60 02/03/2021      ASSESSMENT AND PLAN 66 y.o. year old male  has a past medical history of ADD  (attention deficit disorder), Anxiety, Arthritis, Diabetes ([place]), Eczema, Family history of adverse reaction to anesthesia, Headache, High cholesterol, History of kidney stones, Hypertension, and Right rotator cuff tear. here with:  OSA on CPAP  -Noncompliance - Good treatment of AHI when he uses his machine - Encourage patient to use CPAP nightly and > 4 hours each night -Patient has had issues with spinal stenosis, nasal congestion and a cough that has prohibited him from using the CPAP consistently.   Patient reported at this visit that he has an appointment next month with Dr. Rexene Alberts for new referral for paresthesias of the skin sent by his PCP. However it seems that sports medicine has been treating him for spinal stenosis.  He recently got epidural steroid injection through [facility] imaging.  I advised the patient that he should reach out to his sports medicine doctor to discuss referral to neurology as it seems a diagnosis has already been established.  Advised that a more appropriate referral may be to neurosurgery for pain management or surgical intervention.  Patient voiced understanding.  He will discuss with Dr. Tamala Julian from sports medicine and we will cancel his appointment next month if they deem it not appropriate.  However advised that we would be happy to see him if it was needed.     Ward Givens, MSN, NP-C 05/27/2021, 9:20 AM [facility] [address] [place],  [address] [phone]

## 2021-05-27 NOTE — Progress Notes (Deleted)
Established Patient Office Visit  Subjective:  Patient ID: Steven Contreras, male    DOB: 1955-11-19  Age: 66 y.o. MRN: [id number]  CC:  Chief Complaint  Patient presents with   Follow-up    Rm 19, alone    HPI Steven Contreras presents for ***  Past Medical History:  Diagnosis Date   ADD (attention deficit disorder)    Anxiety    hx of   Arthritis    Diabetes (HCC)    Eczema    Family history of adverse reaction to anesthesia    mother slow to awaken   Headache    High cholesterol    History of kidney stones    Hypertension    Right rotator cuff tear     Past Surgical History:  Procedure Laterality Date   colonscopy  2014   IR URETERAL STENT LEFT NEW ACCESS W/O SEP NEPHROSTOMY CATH  12/29/2020   NEPHROLITHOTOMY Left 12/29/2020   Procedure: LEFT NEPHROLITHOTOMY PERCUTANEOUS;  Surgeon: Crista Elliot, MD;  Location: [facility];  Service: Urology;  Laterality: Left;   SHOULDER SURGERY Left years ago   rotator, bicep and clavicle repair.   TOTAL HIP ARTHROPLASTY Right 10/08/2014   Procedure: RIGHT TOTAL HIP ARTHROPLASTY ANTERIOR APPROACH;  Surgeon: Durene Romans, MD;  Location: [facility];  Service: Orthopedics;  Laterality: Right;    Family History  Problem Relation Age of Onset   Pancreatic cancer Mother    Prostate cancer Father    Allergic rhinitis Sister     Social History   Socioeconomic History   Marital status: Married    Spouse name: Not on file   Number of children: 3   Years of education: Not on file   Highest education level: Not on file  Occupational History   Not on file  Tobacco Use   Smoking status: Former    Packs/day: 0.50    Years: 15.00    Pack years: 7.50    Types: Cigarettes    Quit date: 05/03/2006    Years since quitting: 15.0   Smokeless tobacco: Never  Vaping Use   Vaping Use: Former  Substance and Sexual Activity   Alcohol use: Yes    Comment: rarely   Drug use: Never   Sexual activity: Not on file  Other Topics Concern   Not on  file  Social History Narrative   Not on file   Social Determinants of Health   Financial Resource Strain: Not on file  Food Insecurity: Not on file  Transportation Needs: Not on file  Physical Activity: Not on file  Stress: Not on file  Social Connections: Not on file  Intimate Partner Violence: Not on file    Outpatient Medications Prior to Visit  Medication Sig Dispense Refill   amLODipine (NORVASC) 10 MG tablet Take 10 mg by mouth daily.     aspirin EC 81 MG tablet Take 81 mg by mouth daily. Swallow whole.     cholecalciferol (VITAMIN D3) 25 MCG (1000 UNIT) tablet Take 1,000 Units by mouth daily.     citalopram (CELEXA) 40 MG tablet Take 40 mg by mouth daily.     Colloidal Oatmeal (GOLD BOND ECZEMA RELIEF) 2 % CREA Apply 1 application topically daily as needed (eczema).     etodolac (LODINE) 400 MG tablet Take 400 mg by mouth 2 (two) times daily.     furosemide (LASIX) 40 MG tablet Take 40 mg by mouth daily.     insulin  glargine (LANTUS SOLOSTAR) 100 UNIT/ML Solostar Pen Inject 20 Units into the skin daily.     loratadine (CLARITIN) 10 MG tablet Take 10 mg by mouth daily as needed for allergies.     metFORMIN (GLUCOPHAGE) 500 MG tablet Take 1,000 mg by mouth 2 (two) times daily.     nebivolol (BYSTOLIC) 5 MG tablet Take 2 tablets (10 mg total) by mouth daily. (Patient taking differently: Take 5 mg by mouth daily.) 90 tablet 3   polyvinyl alcohol (LIQUIFILM TEARS) 1.4 % ophthalmic solution Place 1 drop into both eyes 4 (four) times daily as needed for dry eyes.     potassium gluconate 595 (99 K) MG TABS tablet Take 595 mg by mouth daily.     rosuvastatin (CRESTOR) 20 MG tablet TAKE 1 TABLET BY MOUTH EVERY DAY 90 tablet 3   TRULICITY 0.75 MG/0.5ML SOPN Inject into the skin.     zolpidem (AMBIEN) 10 MG tablet Take 10 mg by mouth at bedtime as needed for sleep.     losartan (COZAAR) 50 MG tablet Take 1 tablet (50 mg total) by mouth daily. (Patient not taking: No sig reported) 30  tablet 3   Cholecalciferol (DIALYVITE VITAMIN D 5000) 125 MCG (5000 UT) capsule Take 10,000 Units by mouth every evening.     oxyCODONE (OXY IR/ROXICODONE) 5 MG immediate release tablet Take 1 tablet (5 mg total) by mouth every 4 (four) hours as needed for up to 7 doses for moderate pain or severe pain. 7 tablet 0   senna (SENOKOT) 8.6 MG TABS tablet Take 1 tablet (8.6 mg total) by mouth 2 (two) times daily. 30 tablet 0   No facility-administered medications prior to visit.    Allergies  Allergen Reactions   Ace Inhibitors Cough   Ampicillin     NOT sure if its ampicillin or amoxicillin---stomach cramps   Bupropion Other (See Comments)    Unknown reaction   Venlafaxine Other (See Comments)    Unknown reaction     ROS Review of Systems    Objective:    Physical Exam  Ht 5\' 7"  (1.702 m)    Wt 250 lb 3.2 oz (113.5 kg)    BMI 39.19 kg/m  Wt Readings from Last 3 Encounters:  05/27/21 250 lb 3.2 oz (113.5 kg)  05/12/21 250 lb (113.4 kg)  03/10/21 245 lb (111.1 kg)     Health Maintenance Due  Topic Date Due   Pneumonia Vaccine 6765+ Years old (1 - PCV) Never done   URINE MICROALBUMIN  Never done   Hepatitis C Screening  Never done   Zoster Vaccines- Shingrix (1 of 2) Never done   COLONOSCOPY (Pts 45-6843yrs Insurance coverage will need to be confirmed)  Never done   INFLUENZA VACCINE  12/01/2020    There are no preventive care reminders to display for this patient.  Lab Results  Component Value Date   TSH 1.60 02/03/2021   Lab Results  Component Value Date   WBC 8.5 12/29/2020   HGB 13.5 12/30/2020   HCT 41.1 12/30/2020   MCV 84.3 12/29/2020   PLT 223 12/29/2020   Lab Results  Component Value Date   NA 137 02/03/2021   K 3.6 02/03/2021   CO2 25 02/03/2021   GLUCOSE 170 (H) 02/03/2021   BUN 14 02/03/2021   CREATININE 0.91 02/03/2021   BILITOT 0.7 02/03/2021   ALKPHOS 61 02/03/2021   AST 32 02/03/2021   ALT 41 02/03/2021   PROT 7.5 02/03/2021  ALBUMIN 4.7  02/03/2021   CALCIUM 10.0 02/03/2021   CALCIUM 10.3 02/03/2021   ANIONGAP 7 12/30/2020   GFR 88.08 02/03/2021   No results found for: CHOL No results found for: HDL No results found for: LDLCALC No results found for: TRIG No results found for: CHOLHDL Lab Results  Component Value Date   HGBA1C 8.0 (H) 12/17/2020      Assessment & Plan:   Problem List Items Addressed This Visit   None   No orders of the defined types were placed in this encounter.   Follow-up: No follow-ups on file.    Alverda Skeans, RN

## 2021-05-28 ENCOUNTER — Encounter: Payer: Self-pay | Admitting: Family Medicine

## 2021-05-28 NOTE — Addendum Note (Signed)
Addended by: Trudie Buckler on: 05/28/2021 11:05 AM   Modules accepted: Orders

## 2021-05-29 ENCOUNTER — Other Ambulatory Visit: Payer: Self-pay

## 2021-05-29 DIAGNOSIS — M5416 Radiculopathy, lumbar region: Secondary | ICD-10-CM

## 2021-06-02 ENCOUNTER — Ambulatory Visit
Admission: RE | Admit: 2021-06-02 | Discharge: 2021-06-02 | Disposition: A | Discharge status: Home / Self Care | Payer: Medicare Other | Source: Ambulatory Visit | Attending: Family Medicine | Admitting: Family Medicine

## 2021-06-02 ENCOUNTER — Other Ambulatory Visit: Payer: Self-pay

## 2021-06-02 DIAGNOSIS — M5416 Radiculopathy, lumbar region: Secondary | ICD-10-CM

## 2021-06-02 MED ORDER — IOPAMIDOL (ISOVUE-M 200) INJECTION 41%: 1.0000 mL | Freq: Once | INTRAMUSCULAR | Status: DC

## 2021-06-02 MED ORDER — METHYLPREDNISOLONE ACETATE 40 MG/ML INJ SUSP (RADIOLOG: 80.0000 mg | Freq: Once | INTRAMUSCULAR | Status: DC

## 2021-06-02 NOTE — Discharge Instructions (Signed)

## 2021-06-11 ENCOUNTER — Ambulatory Visit: Payer: PRIVATE HEALTH INSURANCE | Admitting: Cardiology

## 2021-06-11 ENCOUNTER — Other Ambulatory Visit: Payer: Self-pay

## 2021-06-11 ENCOUNTER — Encounter: Payer: Self-pay | Admitting: Cardiology

## 2021-06-11 VITALS — BP 148/91 | HR 59 | Temp 98.0°F | Resp 17 | Ht 67.0 in | Wt 250.0 lb

## 2021-06-11 DIAGNOSIS — I2510 Atherosclerotic heart disease of native coronary artery without angina pectoris: Secondary | ICD-10-CM

## 2021-06-11 DIAGNOSIS — R931 Abnormal findings on diagnostic imaging of heart and coronary circulation: Secondary | ICD-10-CM

## 2021-06-11 DIAGNOSIS — I10 Essential (primary) hypertension: Secondary | ICD-10-CM

## 2021-06-11 DIAGNOSIS — E782 Mixed hyperlipidemia: Secondary | ICD-10-CM

## 2021-06-11 MED ORDER — NEBIVOLOL HCL 5 MG PO TABS: 10.0000 mg | ORAL_TABLET | Freq: Every day | ORAL | 3 refills | Status: AC

## 2021-06-11 NOTE — Progress Notes (Signed)
Patient referred by Jonathon Jordan, MD for screening for CAD  Subjective:   Steven Contreras, male    DOB: October 11, 1955, 66 y.o.   MRN: [id number]   Chief Complaint  Patient presents with   Coronary Artery Disease    1 YEAR     HPI  66 y.o. Caucasian male with hypertension, hyperlipidemia, type 2 DM, elevated calcium score  Patient has no chest pain or shortness of breath symptoms.  He is staying active as much walking as he can.  Since last visit, he was diagnosed with spinal stenosis.  He is getting epidural injections for the same.  Blood pressure is elevated today.  He has noticed that his resting heart rate has been higher than usual.  It appears that he is taking Bystolic 5 mg tablet daily.  Reviewed recent lab results with patient, chest pain.  Initial consultation HPI 02/2020: Patient is a retired [place], and Chief Financial Officer.  He is to be very active until couple years ago, when he started having issues with his right hip.  He is has continued to have right hip pain after undergoing hip replacement.  He has also had pyriformis syndrome.  This is limited his physical activity significantly.  He is not able to walk beyond 2/10 of a mile without having to stop.  Ever since decline in his physical activity, he has gained about 60 pounds of weight.  This is also been coupled with increase in his blood pressure, blood sugar, and cholesterol levels.  He denies any exertional chest pain.  However, he does report dyspnea on more than usual activity, such as working in his yard.  He is currently on amlodipine 10 mg, nebivolol 5 mg, Crestor 5 mg.  He has had severe cough with lisinopril in the past.     Current Outpatient Medications on File Prior to Visit  Medication Sig Dispense Refill   amLODipine (NORVASC) 10 MG tablet Take 10 mg by mouth daily.     aspirin EC 81 MG tablet Take 81 mg by mouth daily. Swallow whole.     cholecalciferol (VITAMIN D3) 25 MCG (1000 UNIT)  tablet Take 1,000 Units by mouth daily.     citalopram (CELEXA) 40 MG tablet Take 40 mg by mouth daily.     Colloidal Oatmeal (GOLD BOND ECZEMA RELIEF) 2 % CREA Apply 1 application topically daily as needed (eczema).     etodolac (LODINE) 400 MG tablet Take 400 mg by mouth 2 (two) times daily.     furosemide (LASIX) 40 MG tablet Take 40 mg by mouth daily.     insulin glargine (LANTUS SOLOSTAR) 100 UNIT/ML Solostar Pen Inject 20 Units into the skin daily.     loratadine (CLARITIN) 10 MG tablet Take 10 mg by mouth daily as needed for allergies.     losartan (COZAAR) 50 MG tablet Take 1 tablet (50 mg total) by mouth daily. (Patient not taking: No sig reported) 30 tablet 3   metFORMIN (GLUCOPHAGE) 500 MG tablet Take 1,000 mg by mouth 2 (two) times daily.     nebivolol (BYSTOLIC) 5 MG tablet Take 2 tablets (10 mg total) by mouth daily. (Patient taking differently: Take 5 mg by mouth daily.) 90 tablet 3   polyvinyl alcohol (LIQUIFILM TEARS) 1.4 % ophthalmic solution Place 1 drop into both eyes 4 (four) times daily as needed for dry eyes.     potassium gluconate 595 (99 K) MG TABS tablet Take 595 mg by mouth  daily.     rosuvastatin (CRESTOR) 20 MG tablet TAKE 1 TABLET BY MOUTH EVERY DAY 90 tablet 3   TRULICITY 9.67 RF/1.6BW SOPN Inject into the skin.     zolpidem (AMBIEN) 10 MG tablet Take 10 mg by mouth at bedtime as needed for sleep.     No current facility-administered medications on file prior to visit.    Cardiovascular and other pertinent studies:  EKG 06/11/2021: Sinus rhythm 68 bpm Poor R-wave progression  Lexiscan (Walking with mod Bruce)Tetrofosmin Stress Test  03/05/2020: Nondiagnostic ECG stress, pharmacologic stresss. There is a  moderate defect in the lateral, inferior and apical regions, due to prominent gut uptake artifact possibly large hiatal hernia noted in the inferolateral wall. In the absence of wall motion abnormality and normal LVEF, ischemia is less likely. Overall LV  systolic function is normal without regional wall motion abnormalities. Stress LV EF: 62%.  No previous exam available for comparison. Low risk. Clinical correlation recommended.  Echocardiogram 02/26/2020:  Left ventricle cavity is normal in size and wall thickness. Normal global  wall motion. Normal LV systolic function with EF 65%. Normal diastolic  filling pattern.  No significant valvular abnormality.  normal right atrial pressure.   CT Cardiac scoring 02/28/2020: Total: 287 LM: 0 LAD: 99 LCx: 126 RCA: 62 Cardiac anatomy: Normal Visualized thorax: RLL linear atelectasis/scarring  Recent labs: 02/03/2021: Glucose 170, BUN/Cr 14/0.91. EGFR 88. Na/K 137/3.6. Rest of the CMP normal H/H 15/44. MCV 84. Platelets 223 HbA1C 8.0% TSH 1.6 normal  05/09/2020: Glucose 157, BUN/Cr 15/1.03. EGFR 76. HbA1C 7.1% Chol 141, TG 170, HDL 53, LDL 59 TSH 1.4 normal  01/28/2020: Glucose 147, BUN/Cr 15/12.8. EGFR 64.56. Na/K 141/4.2. Rest of the CMP normal HbA1C 6.5% Chol 235, TG 273, HDL 49, LDL 131  06/2018: Glucose N/A, BUN/Cr 8/0.81. EGFR normal. Na/K 138/4.6. ALT 47. Rest of the CMP normal Chol 231, TG 335, HDL 57, LDL 107 TSH 2.0 normal   Review of Systems  Cardiovascular:  Positive for dyspnea on exertion. Negative for chest pain, leg swelling, palpitations and syncope.  Musculoskeletal:  Positive for joint pain.       Vitals:   06/11/21 0913 06/11/21 0922  BP: (!) 164/86 (!) 148/91  Pulse: 73 (!) 59  Resp: 17   Temp: 98 F (36.7 C)   SpO2: 97%      Body mass index is 39.16 kg/m. Filed Weights   06/11/21 0913  Weight: 250 lb (113.4 kg)     Objective:   Physical Exam Vitals and nursing note reviewed.  Constitutional:      General: He is not in acute distress. Neck:     Vascular: No JVD.  Cardiovascular:     Rate and Rhythm: Normal rate and regular rhythm.     Pulses:          Dorsalis pedis pulses are 2+ on the right side and 1+ on the left side.        Posterior tibial pulses are 1+ on the right side and 1+ on the left side.     Heart sounds: Normal heart sounds. No murmur heard. Pulmonary:     Effort: Pulmonary effort is normal.     Breath sounds: Normal breath sounds. No wheezing or rales.        Assessment & Recommendations:   66 y.o. Caucasian male with hypertension, hyperlipidemia, type 2 DM, elevated calcium score, abnormal stress test  CAD without angina:  Nuclear stress test reported as "moderate defect in  the lateral, inferior and apical regions, due to prominent gut uptake artifact possibly large hiatal hernia noted in the inferolateral wall. In the absence of wall motion abnormality and normal LVEF, ischemia is less likely". However, elevated calcium score with in all three vessels suggest ischemia cannot be excluded. There is no left main calcification. He does not have lifestyle limiting chest pain or shortness of breath symptoms. Reasonable continue medical management for now.   In absence of bleeding, continue aspirin 81 mg Increase nebivolol to 10 mg daily. Continue Crestor 20 mg daily.  Check lipid panel.  Continue Jardiance for management of diabetes to reduce CV events.  Patient will discuss with PCP.  Hypertension: Blood pressure elevated.  Increase nadolol to 10 mg daily.   Patient sent a MyChart message with his blood pressure readings next few weeks.   Well-controlled.    F/u in 6 months     Nigel Mormon, MD Pager: [phone] Office: [phone]

## 2021-06-15 NOTE — Progress Notes (Signed)
[name] [address] [place] [address] Phone: [phone] Subjective:   Steven Contreras, am serving as a scribe for Dr. Antoine Primas. This visit occurred during the SARS-CoV-2 public health emergency.  Safety protocols were in place, including screening questions prior to the visit, additional usage of staff PPE, and extensive cleaning of exam room while observing appropriate contact time as indicated for disinfecting solutions.  I'm seeing this patient by the request  of:  [name], MD  CC: back pain   NHA:[id number]  05/14/2021 (Video Visit) Assessment and Plan: A 66 year old gentleman who does have an L4-L5 left-sided L5 nerve root impingement secondary to disc fragment as well as facet arthropathy.  Going to attempt an epidural to see if this will be beneficial.  Discussed with patient that I think possible injections are may be necessary for diagnostic and therapeutic purposes.  We discussed differential for treatment options would also include surgical intervention which we may need to talk about.  Discussed with patient about this at great length.  Hopeful that patient will respond well and follow-up with me again 4 weeks after the injection but will right within 48 hours of the injection.   Updated 06/16/2021 Steven Contreras is a 66 y.o. male coming in with complaint of back pain. Epi on 06/02/2021. Patient states that second injection seemed to help more than the 1st injection. Also feels turmeric may or may not help. Pain is not lasting as long as it use to but is still a constant pressure in lumbar spine. Will have tingling in B thighs intermittently. Unable to to walk long distances.        Past Medical History:  Diagnosis Date   ADD (attention deficit disorder)    Anxiety    hx of   Arthritis    Diabetes (HCC)    Eczema    Family history of adverse reaction to anesthesia    mother slow to awaken   Headache    High  cholesterol    History of kidney stones    Hypertension    Right rotator cuff tear    Past Surgical History:  Procedure Laterality Date   colonscopy  2014   IR URETERAL STENT LEFT NEW ACCESS W/O SEP NEPHROSTOMY CATH  12/29/2020   NEPHROLITHOTOMY Left 12/29/2020   Procedure: LEFT NEPHROLITHOTOMY PERCUTANEOUS;  Surgeon: Crista Elliot, MD;  Location: [facility];  Service: Urology;  Laterality: Left;   SHOULDER SURGERY Left years ago   rotator, bicep and clavicle repair.   TOTAL HIP ARTHROPLASTY Right 10/08/2014   Procedure: RIGHT TOTAL HIP ARTHROPLASTY ANTERIOR APPROACH;  Surgeon: Durene Romans, MD;  Location: [facility];  Service: Orthopedics;  Laterality: Right;   Social History   Socioeconomic History   Marital status: Married    Spouse name: Not on file   Number of children: 3   Years of education: Not on file   Highest education level: Not on file  Occupational History   Not on file  Tobacco Use   Smoking status: Former    Packs/day: 0.50    Years: 15.00    Pack years: 7.50    Types: Cigarettes    Quit date: 05/03/2006    Years since quitting: 15.1   Smokeless tobacco: Never  Vaping Use   Vaping Use: Former  Substance and Sexual Activity   Alcohol use: Yes    Comment: rarely   Drug use: Never   Sexual activity: Not  on file  Other Topics Concern   Not on file  Social History Narrative   Not on file   Social Determinants of Health   Financial Resource Strain: Not on file  Food Insecurity: Not on file  Transportation Needs: Not on file  Physical Activity: Not on file  Stress: Not on file  Social Connections: Not on file   Allergies  Allergen Reactions   Ace Inhibitors Cough   Ampicillin     NOT sure if its ampicillin or amoxicillin---stomach cramps   Bupropion Other (See Comments)    Unknown reaction   Venlafaxine Other (See Comments)    Unknown reaction    Family History  Problem Relation Age of Onset   Pancreatic cancer Mother    Prostate cancer Father     Allergic rhinitis Sister     Current Outpatient Medications (Endocrine & Metabolic):    insulin glargine (LANTUS SOLOSTAR) 100 UNIT/ML Solostar Pen, Inject 20 Units into the skin daily.   metFORMIN (GLUCOPHAGE) 500 MG tablet, Take 1,000 mg by mouth 2 (two) times daily.   TRULICITY 0.75 MG/0.5ML SOPN, Inject into the skin.  Current Outpatient Medications (Cardiovascular):    amLODipine (NORVASC) 10 MG tablet, Take 10 mg by mouth daily.   furosemide (LASIX) 40 MG tablet, Take 40 mg by mouth daily.   nebivolol (BYSTOLIC) 5 MG tablet, Take 2 tablets (10 mg total) by mouth daily.   rosuvastatin (CRESTOR) 20 MG tablet, TAKE 1 TABLET BY MOUTH EVERY DAY  Current Outpatient Medications (Respiratory):    loratadine (CLARITIN) 10 MG tablet, Take 10 mg by mouth daily as needed for allergies.  Current Outpatient Medications (Analgesics):    aspirin EC 81 MG tablet, Take 81 mg by mouth daily. Swallow whole.   etodolac (LODINE) 400 MG tablet, Take 400 mg by mouth 2 (two) times daily.   Current Outpatient Medications (Other):    cholecalciferol (VITAMIN D3) 25 MCG (1000 UNIT) tablet, Take 1,000 Units by mouth daily.   citalopram (CELEXA) 40 MG tablet, Take 40 mg by mouth daily.   Colloidal Oatmeal (GOLD BOND ECZEMA RELIEF) 2 % CREA, Apply 1 application topically daily as needed (eczema).   magnesium 30 MG tablet, Take 30 mg by mouth 2 (two) times daily.   NON FORMULARY, ANTI FLAMITORY   Nutritional Supplements (DHEA PO)*, Take by mouth.   polyvinyl alcohol (LIQUIFILM TEARS) 1.4 % ophthalmic solution, Place 1 drop into both eyes 4 (four) times daily as needed for dry eyes.   potassium gluconate 595 (99 K) MG TABS tablet, Take 595 mg by mouth daily.   saw palmetto 160 MG capsule, Take 160 mg by mouth 2 (two) times daily.   zolpidem (AMBIEN) 10 MG tablet, Take 10 mg by mouth at bedtime as needed for sleep. * These medications belong to multiple therapeutic classes and are listed under each applicable  group.   Reviewed prior external information including notes and imaging from  primary care provider As well as notes that were available from care everywhere and other healthcare systems.  Past medical history, social, surgical and family history all reviewed in electronic medical record.  No pertanent information unless stated regarding to the chief complaint.   Review of Systems:  No headache, visual changes, nausea, vomiting, diarrhea, constipation, dizziness, abdominal pain, skin rash, fevers, chills, night sweats, weight loss, swollen lymph nodes, body aches, joint swelling, chest pain, shortness of breath, mood changes. POSITIVE muscle aches  Objective  Blood pressure 124/82, pulse 67, height 5\' 7"  (  1.702 m), weight 247 lb (112 kg), SpO2 96 %.   General: No apparent distress alert and oriented x3 mood and affect normal, dressed appropriately.  HEENT: Pupils equal, extraocular movements intact  Respiratory: Patient's speak in full sentences and does not appear short of breath  Cardiovascular: No lower extremity edema, non tender, no erythema  Gait antalgic  MSK:  loss of lordosis-patient continues to have some discomfort improvement noted on exam today.  Likely no significant weakness.  Patient has significant decreased range of motion with external rotation of the hips bilaterally secondary to deconditioning.    Impression and Recommendations:     The above documentation has been reviewed and is accurate and complete Judi Saa, DO

## 2021-06-16 ENCOUNTER — Other Ambulatory Visit: Payer: Self-pay

## 2021-06-16 ENCOUNTER — Encounter: Payer: Self-pay | Admitting: Family Medicine

## 2021-06-16 ENCOUNTER — Ambulatory Visit (INDEPENDENT_AMBULATORY_CARE_PROVIDER_SITE_OTHER): Payer: Medicare Other | Admitting: Family Medicine

## 2021-06-16 VITALS — BP 124/82 | HR 67 | Ht 67.0 in | Wt 247.0 lb

## 2021-06-16 DIAGNOSIS — R931 Abnormal findings on diagnostic imaging of heart and coronary circulation: Secondary | ICD-10-CM | POA: Diagnosis not present

## 2021-06-16 DIAGNOSIS — M5416 Radiculopathy, lumbar region: Secondary | ICD-10-CM

## 2021-06-16 NOTE — Assessment & Plan Note (Signed)
Low back exam does have some loss of lordosis.  Patient's MRI did show that there was a fragment that could be potentially contributing and given patient's patient has severe intermittent pain.  Patient has failed all other conservative therapy including injections at this point that only gave him very minimal improvement.  Do believe at this point we will refer to surgery to discuss other options that would be beneficial for him.  Patient is highly motivated to attempt to lose weight but is finding it difficult secondary to the amount of pain in his back as well.  We have discussed different medications and patient will do more of the ibuprofen but did not find any significant improvement with the gabapentin given previously.

## 2021-06-16 NOTE — Patient Instructions (Signed)
I am always here for you  Refer you to Dr. Lynann Bologna to discuss next step  Please reach out if you need anything

## 2021-06-25 ENCOUNTER — Institutional Professional Consult (permissible substitution): Payer: Medicare Other | Admitting: Neurology

## 2021-06-29 ENCOUNTER — Encounter: Payer: Self-pay | Admitting: Family Medicine

## 2021-07-07 ENCOUNTER — Encounter: Payer: Self-pay | Admitting: Family Medicine

## 2021-07-09 ENCOUNTER — Other Ambulatory Visit: Payer: Self-pay | Admitting: Orthopedic Surgery

## 2021-07-09 DIAGNOSIS — M5416 Radiculopathy, lumbar region: Secondary | ICD-10-CM

## 2021-07-13 ENCOUNTER — Other Ambulatory Visit: Payer: Self-pay

## 2021-07-13 ENCOUNTER — Ambulatory Visit
Admission: RE | Admit: 2021-07-13 | Discharge: 2021-07-13 | Disposition: A | Discharge status: Home / Self Care | Payer: Medicare Other | Source: Ambulatory Visit | Attending: Orthopedic Surgery | Admitting: Orthopedic Surgery

## 2021-07-13 DIAGNOSIS — M5416 Radiculopathy, lumbar region: Secondary | ICD-10-CM

## 2021-07-13 MED ORDER — METHYLPREDNISOLONE ACETATE 40 MG/ML INJ SUSP (RADIOLOG
80.0000 mg | Freq: Once | INTRAMUSCULAR | Status: AC
  Administered 2021-07-13: 80 mg via EPIDURAL

## 2021-07-13 MED ORDER — IOPAMIDOL (ISOVUE-M 200) INJECTION 41%
1.0000 mL | Freq: Once | INTRAMUSCULAR | Status: AC
  Administered 2021-07-13: 1 mL via EPIDURAL

## 2021-07-13 NOTE — Discharge Instructions (Signed)

## 2021-09-10 ENCOUNTER — Encounter: Payer: Self-pay | Admitting: Family Medicine

## 2021-10-22 ENCOUNTER — Encounter: Payer: Self-pay | Admitting: Family Medicine

## 2021-11-11 ENCOUNTER — Encounter: Payer: Self-pay | Admitting: Family Medicine

## 2021-11-11 ENCOUNTER — Ambulatory Visit (INDEPENDENT_AMBULATORY_CARE_PROVIDER_SITE_OTHER): Payer: Medicare Other | Admitting: Family Medicine

## 2021-11-11 DIAGNOSIS — R931 Abnormal findings on diagnostic imaging of heart and coronary circulation: Secondary | ICD-10-CM

## 2021-11-11 DIAGNOSIS — M5416 Radiculopathy, lumbar region: Secondary | ICD-10-CM

## 2021-11-11 NOTE — Progress Notes (Signed)
[facility] [address] [place] [address] Phone: [phone] Subjective:   Steven Contreras, am serving as a scribe for Dr. Antoine Contreras.  I'm seeing this patient by the request  of:  Steven Palmer, MD  CC: Right hip and back pain with mobility issues  [id number]  Steven Contreras is a 66 y.o. male coming in with complaint of low back pain and hip pain.  Patient was having more discomfort noted of the low back that did cause patient to be incapacitated.  Did see a orthopedic surgeon to discuss back intervention. Gabapentin has helped with nerve pain. The further he walks the more painful it is. Not nerve pain but muscular. Radiates up back and gets locked up. Why is he not a surgery candidate?  Patient states that the surgeon that he saw, Dr. Yevette Contreras told him he was no longer a surgical candidate because he did improve with the injection initially.  Last epidural working March 2023 MRI 05/13/21-  CONSIDER CHANGE TO CYMBALTA FORM CELEXA    Past Medical History:  Diagnosis Date   ADD (attention deficit disorder)    Anxiety    hx of   Arthritis    Diabetes (HCC)    Eczema    Family history of adverse reaction to anesthesia    mother slow to awaken   Headache    High cholesterol    History of kidney stones    Hypertension    Right rotator cuff tear    Past Surgical History:  Procedure Laterality Date   colonscopy  2014   IR URETERAL STENT LEFT NEW ACCESS W/O SEP NEPHROSTOMY CATH  12/29/2020   NEPHROLITHOTOMY Left 12/29/2020   Procedure: LEFT NEPHROLITHOTOMY PERCUTANEOUS;  Surgeon: Steven Elliot, MD;  Location: [facility];  Service: Urology;  Laterality: Left;   SHOULDER SURGERY Left years ago   rotator, bicep and clavicle repair.   TOTAL HIP ARTHROPLASTY Right 10/08/2014   Procedure: RIGHT TOTAL HIP ARTHROPLASTY ANTERIOR APPROACH;  Surgeon: Steven Romans, MD;  Location: [facility];  Service: Orthopedics;  Laterality: Right;   Social History    Socioeconomic History   Marital status: Married    Spouse name: Not on file   Number of children: 3   Years of education: Not on file   Highest education level: Not on file  Occupational History   Not on file  Tobacco Use   Smoking status: Former    Packs/day: 0.50    Years: 15.00    Total pack years: 7.50    Types: Cigarettes    Quit date: 05/03/2006    Years since quitting: 15.5   Smokeless tobacco: Never  Vaping Use   Vaping Use: Former  Substance and Sexual Activity   Alcohol use: Yes    Comment: rarely   Drug use: Never   Sexual activity: Not on file  Other Topics Concern   Not on file  Social History Narrative   Not on file   Social Determinants of Health   Financial Resource Strain: Not on file  Food Insecurity: Not on file  Transportation Needs: Not on file  Physical Activity: Not on file  Stress: Not on file  Social Connections: Not on file   Allergies  Allergen Reactions   Ace Inhibitors Cough   Ampicillin     NOT sure if its ampicillin or amoxicillin---stomach cramps   Bupropion Other (See Comments)    Unknown reaction   Venlafaxine Other (See  Comments)    Unknown reaction    Family History  Problem Relation Age of Onset   Pancreatic cancer Mother    Prostate cancer Father    Allergic rhinitis Sister     Current Outpatient Medications (Endocrine & Metabolic):    insulin glargine (LANTUS SOLOSTAR) 100 UNIT/ML Solostar Pen, Inject 20 Units into the skin daily.   metFORMIN (GLUCOPHAGE) 500 MG tablet, Take 1,000 mg by mouth 2 (two) times daily.   TRULICITY 0.75 MG/0.5ML SOPN, Inject into the skin.  Current Outpatient Medications (Cardiovascular):    amLODipine (NORVASC) 10 MG tablet, Take 10 mg by mouth daily.   furosemide (LASIX) 40 MG tablet, Take 40 mg by mouth daily.   nebivolol (BYSTOLIC) 5 MG tablet, Take 2 tablets (10 mg total) by mouth daily.   rosuvastatin (CRESTOR) 20 MG tablet, TAKE 1 TABLET BY MOUTH EVERY DAY  Current Outpatient  Medications (Respiratory):    loratadine (CLARITIN) 10 MG tablet, Take 10 mg by mouth daily as needed for allergies.  Current Outpatient Medications (Analgesics):    aspirin EC 81 MG tablet, Take 81 mg by mouth daily. Swallow whole.   etodolac (LODINE) 400 MG tablet, Take 400 mg by mouth 2 (two) times daily.   Current Outpatient Medications (Other):    cholecalciferol (VITAMIN D3) 25 MCG (1000 UNIT) tablet, Take 1,000 Units by mouth daily.   citalopram (CELEXA) 40 MG tablet, Take 40 mg by mouth daily.   Colloidal Oatmeal (GOLD BOND ECZEMA RELIEF) 2 % CREA, Apply 1 application topically daily as needed (eczema).   magnesium 30 MG tablet, Take 30 mg by mouth 2 (two) times daily.   NON FORMULARY, ANTI FLAMITORY   Nutritional Supplements (DHEA PO), Take by mouth.   polyvinyl alcohol (LIQUIFILM TEARS) 1.4 % ophthalmic solution, Place 1 drop into both eyes 4 (four) times daily as needed for dry eyes.   potassium gluconate 595 (99 K) MG TABS tablet, Take 595 mg by mouth daily.   saw palmetto 160 MG capsule, Take 160 mg by mouth 2 (two) times daily.   zolpidem (AMBIEN) 10 MG tablet, Take 10 mg by mouth at bedtime as needed for sleep.   Reviewed prior external information including notes and imaging from  primary care provider As well as notes that were available from care everywhere and other healthcare systems.  Past medical history, social, surgical and family history all reviewed in electronic medical record.  No pertanent information unless stated regarding to the chief complaint.   Review of Systems:  No headache, visual changes, nausea, vomiting, diarrhea, constipation, dizziness, abdominal pain, skin rash, fevers, chills, night sweats, weight loss, swollen lymph nodes joint swelling, chest pain, shortness of breath, mood changes. POSITIVE muscle aches, body aches  Objective  Blood pressure 116/76, pulse 74, height 5\' 7"  (1.702 m), weight 249 lb (112.9 kg), SpO2 93 %.   General: No  apparent distress alert and oriented x3 mood and affect normal, dressed appropriately.  HEENT: Pupils equal, extraocular movements intact  Respiratory: Patient's speak in full sentences and does not appear short of breath  Cardiovascular: No lower extremity edema, non tender, no erythema  Patient severely antalgic at this time.  Patient is using the aid of a cane to walk.  Unable to stand completely upright.  Patient does have a positive straight leg test with radicular symptoms in the L4 and L5 distribution.  Mild weakness noted of the right lower leg with dorsiflexion and plantarflexion compared to the contralateral side.  Deep tendon  reflexes are intact.  Patient may even have weakness in hip flexion on the right compared to left with 4 out of 5 strength.    Impression and Recommendations:      The above documentation has been reviewed and is accurate and complete Judi Saa, DO

## 2021-11-11 NOTE — Patient Instructions (Addendum)
Talk to Dr. Yevette Edwards With weakness and unable to walk greater than 200 ft before stopping, don't know if medication change is enough Keep me updated otherwise I'm here if you need me

## 2021-11-11 NOTE — Assessment & Plan Note (Signed)
Patient does have some lumbar radiculopathy.  Does have a positive straight leg test with radicular symptoms.  Patient is unable to walk greater than 200 feet without any significant discomfort and pain as well.  Patient is already on gabapentin 300 mg daily.  Discussed other medication changes including citalopram to Cymbalta.  At this point though the patient's quality of life seems to be fairly significantly deteriorated at this time.  Discussed with patient about with patient worsening symptoms to [facility] evaluation with surgeon to see if this changes any type of opinion.  I do feel that he is worsening over the course of time which is concerning at this moment.

## 2021-11-16 ENCOUNTER — Ambulatory Visit: Payer: Medicare Other | Admitting: Family Medicine

## 2021-12-09 ENCOUNTER — Other Ambulatory Visit: Payer: Self-pay | Admitting: Orthopedic Surgery

## 2021-12-11 ENCOUNTER — Ambulatory Visit: Payer: Medicare Other | Admitting: Cardiology

## 2021-12-11 ENCOUNTER — Encounter: Payer: Self-pay | Admitting: Cardiology

## 2021-12-11 VITALS — BP 147/86 | HR 71 | Temp 97.7°F | Resp 16 | Ht 67.0 in | Wt 250.0 lb

## 2021-12-11 DIAGNOSIS — I2510 Atherosclerotic heart disease of native coronary artery without angina pectoris: Secondary | ICD-10-CM

## 2021-12-11 DIAGNOSIS — R931 Abnormal findings on diagnostic imaging of heart and coronary circulation: Secondary | ICD-10-CM

## 2021-12-11 DIAGNOSIS — I10 Essential (primary) hypertension: Secondary | ICD-10-CM

## 2021-12-11 NOTE — Progress Notes (Addendum)
Patient referred by [name], MD for screening for CAD  Subjective:   Steven Contreras, male    DOB: 11/02/1955, 66 y.o.   MRN: [id number]   Chief Complaint  Patient presents with   Elevated coronary artery calcium score   Follow-up     HPI  66 y.o. Caucasian male with hypertension, hyperlipidemia, type 2 DM, elevated calcium score  Patient has no chest pain or shortness of breath symptoms.  He is able to bike 30-60 min on stationary bike without any symptoms. He is disappointed with his A1C increasing on recent bloodwork. He is hoping to undergo spine surgery by Dr. Marthenia Rolling in a few weeks from now, but fears his elevated blood glucose may affect this.   Reviewed recent test results with the patient, details below.   He is currently on amlodipine 10 mg, nebivolol 5 mg, Crestor 5 mg.  He has had severe cough with lisinopril in the past.  Current Outpatient Medications:    amLODipine (NORVASC) 10 MG tablet, Take 10 mg by mouth daily., Disp: , Rfl:    aspirin EC 81 MG tablet, Take 81 mg by mouth daily. Swallow whole., Disp: , Rfl:    cholecalciferol (VITAMIN D3) 25 MCG (1000 UNIT) tablet, Take 1,000 Units by mouth daily., Disp: , Rfl:    Colloidal Oatmeal (GOLD BOND ECZEMA RELIEF) 2 % CREA, Apply 1 application topically daily as needed (eczema)., Disp: , Rfl:    furosemide (LASIX) 40 MG tablet, Take 40 mg by mouth daily., Disp: , Rfl:    insulin glargine (LANTUS SOLOSTAR) 100 UNIT/ML Solostar Pen, Inject 20 Units into the skin daily., Disp: , Rfl:    loratadine (CLARITIN) 10 MG tablet, Take 10 mg by mouth daily as needed for allergies., Disp: , Rfl:    magnesium 30 MG tablet, Take 30 mg by mouth 2 (two) times daily., Disp: , Rfl:    metFORMIN (GLUCOPHAGE) 500 MG tablet, Take 1,000 mg by mouth 2 (two) times daily., Disp: , Rfl:    nebivolol (BYSTOLIC) 5 MG tablet, Take 2 tablets (10 mg total) by mouth daily., Disp: 90 tablet, Rfl: 3   polyvinyl alcohol (LIQUIFILM TEARS) 1.4 %  ophthalmic solution, Place 1 drop into both eyes 4 (four) times daily as needed for dry eyes., Disp: , Rfl:    potassium gluconate 595 (99 K) MG TABS tablet, Take 595 mg by mouth daily., Disp: , Rfl:    rosuvastatin (CRESTOR) 20 MG tablet, TAKE 1 TABLET BY MOUTH EVERY DAY, Disp: 90 tablet, Rfl: 3   saw palmetto 160 MG capsule, Take 160 mg by mouth 2 (two) times daily., Disp: , Rfl:    TRULICITY 0.75 MG/0.5ML SOPN, Inject into the skin., Disp: , Rfl:    zolpidem (AMBIEN) 10 MG tablet, Take 10 mg by mouth at bedtime as needed for sleep., Disp: , Rfl:    Cardiovascular and other pertinent studies:  EKG 12/11/2021: Sinus rhythm 66 bpm Poor R wave progression  Lexiscan (Walking with mod Bruce)Tetrofosmin Stress Test  03/05/2020: Nondiagnostic ECG stress, pharmacologic stresss. There is a  moderate defect in the lateral, inferior and apical regions, due to prominent gut uptake artifact possibly large hiatal hernia noted in the inferolateral wall. In the absence of wall motion abnormality and normal LVEF, ischemia is less likely. Overall LV systolic function is normal without regional wall motion abnormalities. Stress LV EF: 62%.  No previous exam available for comparison. Low risk. Clinical correlation recommended.  Echocardiogram 02/26/2020:  Left  ventricle cavity is normal in size and wall thickness. Normal global  wall motion. Normal LV systolic function with EF 65%. Normal diastolic  filling pattern.  No significant valvular abnormality.  normal right atrial pressure.   CT Cardiac scoring 02/28/2020: Total: 287 LM: 0 LAD: 99 LCx: 126 RCA: 62 Cardiac anatomy: Normal Visualized thorax: RLL linear atelectasis/scarring  Recent labs: 12/08/2021: HbA1C 8.8%  Review of Systems  Cardiovascular:  Negative for chest pain, dyspnea on exertion, leg swelling, palpitations and syncope.  Musculoskeletal:  Positive for joint pain.        Vitals:   12/11/21 0845  BP: (!) 147/86  Pulse: 71   Resp: 16  Temp: 97.7 F (36.5 C)  SpO2: 93%     Body mass index is 39.16 kg/m. Filed Weights   12/11/21 0845  Weight: 250 lb (113.4 kg)     Objective:   Physical Exam Vitals and nursing note reviewed.  Constitutional:      General: He is not in acute distress. Neck:     Vascular: No JVD.  Cardiovascular:     Rate and Rhythm: Normal rate and regular rhythm.     Pulses:          Dorsalis pedis pulses are 2+ on the right side and 1+ on the left side.       Posterior tibial pulses are 1+ on the right side and 1+ on the left side.     Heart sounds: Normal heart sounds. No murmur heard. Pulmonary:     Effort: Pulmonary effort is normal.     Breath sounds: Normal breath sounds. No wheezing or rales.  Musculoskeletal:     Right lower leg: No edema.     Left lower leg: No edema.         Assessment & Recommendations:   66 y.o. Caucasian male with hypertension, hyperlipidemia, type 2 DM, elevated calcium score, abnormal stress test  CAD without angina:  Nuclear stress test (03/2020) reported as "moderate defect in the lateral, inferior and apical regions, due to prominent gut uptake artifact possibly large hiatal hernia noted in the inferolateral wall. In the absence of wall motion abnormality and normal LVEF, ischemia is less likely". However, elevated calcium score with in all three vessels suggest ischemia cannot be excluded. There is no left main calcification. He does not have any angina symptoms. Reasonable continue medical management for now.  Continue Aspirin, statin, nebivolol.  Pre-op risk stratification: In absence of angina symptoms, cardiac risk is low. Okay to proceed with spine surgery, when other medically deemed fit.  Okay to hold Aspirin for 5 days before the surgery.  Type 2 DM: Defer to PCP. Consider Ozempic given uncontrolled diabetes, obesity, and cardiovascular disease.  Hypertension: Generally well controlled. No change in antihypertensive  therapy today.  F/u in 6 months     Elder Negus, MD Pager: [phone] Office: [phone]

## 2021-12-28 NOTE — Pre-Procedure Instructions (Signed)
Surgical Instructions    Your procedure is scheduled on Wednesday September 6.  Report to [facility] Main Entrance "A" at 5:30 A.M., then check in with the Admitting office.  Call this number if you have problems the morning of surgery:  [phone]   If you have any questions prior to your surgery date call [phone]: Open Monday-Friday 8am-4pm    Remember:  Do not eat after midnight the night before your surgery  You may drink clear liquids until 5:30 the morning of your surgery.   Clear liquids allowed are: Water, Non-Citrus Juices (without pulp), Carbonated Beverages, Clear Tea, Black Coffee ONLY (NO MILK, CREAM OR POWDERED CREAMER of any kind), and Gatorade    Patient Instructions  The night before surgery:  No food after midnight. ONLY clear liquids after midnight  The day of surgery (if you have diabetes): Drink ONE (1) 12 oz G2 given to you in your pre admission testing appointment by 5:30 the morning of surgery. Drink in one sitting. Do not sip.  This drink was given to you during your hospital  pre-op appointment visit.  Nothing else to drink after completing the  12 oz bottle of G2.         If you have questions, please contact your surgeon's office.   Take these medicines the morning of surgery with A SIP OF WATER:  amLODipine (NORVASC) citalopram (CELEXA) nebivolol (BYSTOLIC) rosuvastatin (CRESTOR)  If Needed: loratadine (CLARITIN) polyvinyl alcohol (LIQUIFILM TEARS)    As of today, STOP taking any Aspirin (unless otherwise instructed by your surgeon) Aleve, Naproxen, Ibuprofen, Motrin, Advil, Goody's, BC's, all herbal medications, fish oil, and all vitamins.  WHAT DO I DO ABOUT MY DIABETES MEDICATION?   Do not take metFORMIN (GLUCOPHAGE) the morning of surgery.   THE NIGHT BEFORE SURGERY, take 10 units of insulin glargine (LANTUS SOLOSTAR) if taken at night.    THE MORNING OF SURGERY, take 10 units of insulin glargine (LANTUS SOLOSTAR) if taken in  morning.   HOW TO MANAGE YOUR DIABETES BEFORE AND AFTER SURGERY  Why is it important to control my blood sugar before and after surgery? Improving blood sugar levels before and after surgery helps healing and can limit problems. A way of improving blood sugar control is eating a healthy diet by:  Eating less sugar and carbohydrates  Increasing activity/exercise  Talking with your doctor about reaching your blood sugar goals High blood sugars (greater than 180 mg/dL) can raise your risk of infections and slow your recovery, so you will need to focus on controlling your diabetes during the weeks before surgery. Make sure that the doctor who takes care of your diabetes knows about your planned surgery including the date and location.  How do I manage my blood sugar before surgery? Check your blood sugar at least 4 times a day, starting 2 days before surgery, to make sure that the level is not too high or low.  Check your blood sugar the morning of your surgery when you wake up and every 2 hours until you get to the Short Stay unit.  If your blood sugar is less than 70 mg/dL, you will need to treat for low blood sugar: Do not take insulin. Treat a low blood sugar (less than 70 mg/dL) with  cup of clear juice (cranberry or apple), 4 glucose tablets, OR glucose gel. Recheck blood sugar in 15 minutes after treatment (to make sure it is greater than 70 mg/dL). If your blood sugar is not  greater than 70 mg/dL on recheck, call [phone] for further instructions. Report your blood sugar to the short stay nurse when you get to Short Stay.  If you are admitted to the hospital after surgery: Your blood sugar will be checked by the staff and you will probably be given insulin after surgery (instead of oral diabetes medicines) to make sure you have good blood sugar levels. The goal for blood sugar control after surgery is 80-180 mg/dL.           Do not wear jewelry or makeup. Do not wear lotions,  powders, cologne or deodorant. Men may shave face and neck. Do not bring valuables to the hospital.  [facility] is not responsible for any belongings or valuables.    Do NOT Smoke (Tobacco/Vaping)  24 hours prior to your procedure  If you use a CPAP at night, you may bring your mask for your overnight stay.   Contacts, glasses, hearing aids, dentures or partials may not be worn into surgery, please bring cases for these belongings   For patients admitted to the hospital, discharge time will be determined by your treatment team.   Patients discharged the day of surgery will not be allowed to drive home, and someone needs to stay with them for 24 hours.   SURGICAL WAITING ROOM VISITATION Patients having surgery or a procedure may have no more than 2 support people in the waiting area - these visitors may rotate.   Children under the age of 81 must have an adult with them who is not the patient. If the patient needs to stay at the hospital during part of their recovery, the visitor guidelines for inpatient rooms apply. Pre-op nurse will coordinate an appropriate time for 1 support person to accompany patient in pre-op.  This support person may not rotate.   Please refer to the [facility] website for the visitor guidelines for Inpatients (after your surgery is over and you are in a regular room).    Special instructions:    Oral Hygiene is also important to reduce your risk of infection.  Remember - BRUSH YOUR TEETH THE MORNING OF SURGERY WITH YOUR REGULAR TOOTHPASTE   [place]- Preparing For Surgery  Before surgery, you can play an important role. Because skin is not sterile, your skin needs to be as free of germs as possible. You can reduce the number of germs on your skin by washing with CHG (chlorahexidine gluconate) Soap before surgery.  CHG is an antiseptic cleaner which kills germs and bonds with the skin to continue killing germs even after washing.     Please do not use  if you have an allergy to CHG or antibacterial soaps. If your skin becomes reddened/irritated stop using the CHG.  Do not shave (including legs and underarms) for at least 48 hours prior to first CHG shower. It is OK to shave your face.  Please follow these instructions carefully.     Shower the NIGHT BEFORE SURGERY and the MORNING OF SURGERY with CHG Soap.   If you chose to wash your hair, wash your hair first as usual with your normal shampoo. After you shampoo, rinse your hair and body thoroughly to remove the shampoo.  Then [employer] and genitals (private parts) with your normal soap and rinse thoroughly to remove soap.  After that Use CHG Soap as you would any other liquid soap. You can apply CHG directly to the skin and wash gently with a scrungie or a  clean washcloth.   Apply the CHG Soap to your body ONLY FROM THE NECK DOWN.  Do not use on open wounds or open sores. Avoid contact with your eyes, ears, mouth and genitals (private parts). Wash Face and genitals (private parts)  with your normal soap.   Wash thoroughly, paying special attention to the area where your surgery will be performed.  Thoroughly rinse your body with warm water from the neck down.  DO NOT shower/wash with your normal soap after using and rinsing off the CHG Soap.  Pat yourself dry with a CLEAN TOWEL.  Wear CLEAN PAJAMAS to bed the night before surgery  Place CLEAN SHEETS on your bed the night before your surgery  DO NOT SLEEP WITH PETS.   Day of Surgery:  Take a shower with CHG soap. Wear Clean/Comfortable clothing the morning of surgery Do not apply any deodorants/lotions.   Remember to brush your teeth WITH YOUR REGULAR TOOTHPASTE.    If you received a COVID test during your pre-op visit, it is requested that you wear a mask when out in public, stay away from anyone that may not be feeling well, and notify your surgeon if you develop symptoms. If you have been in contact with anyone that has  tested positive in the last 10 days, please notify your surgeon.    Please read over the following fact sheets that you were given.

## 2021-12-29 ENCOUNTER — Encounter (HOSPITAL_COMMUNITY): Payer: Self-pay

## 2021-12-29 ENCOUNTER — Other Ambulatory Visit: Payer: Self-pay

## 2021-12-29 ENCOUNTER — Encounter (HOSPITAL_COMMUNITY)
Admission: RE | Admit: 2021-12-29 | Discharge: 2021-12-29 | Disposition: A | Discharge status: Home / Self Care | Payer: Medicare Other | Source: Ambulatory Visit | Attending: Orthopedic Surgery | Admitting: Orthopedic Surgery

## 2021-12-29 VITALS — BP 146/92 | HR 76 | Temp 98.2°F | Resp 17 | Ht 67.0 in | Wt 249.8 lb

## 2021-12-29 DIAGNOSIS — Z01818 Encounter for other preprocedural examination: Secondary | ICD-10-CM | POA: Insufficient documentation

## 2021-12-29 DIAGNOSIS — M5416 Radiculopathy, lumbar region: Secondary | ICD-10-CM | POA: Insufficient documentation

## 2021-12-29 HISTORY — DX: Sleep apnea, unspecified: G47.30

## 2021-12-29 HISTORY — DX: Thyrotoxicosis, unspecified without thyrotoxic crisis or storm: E05.90

## 2021-12-29 LAB — GLUCOSE, CAPILLARY: Glucose-Capillary: 202 mg/dL — ABNORMAL HIGH (ref 70–99)

## 2021-12-29 LAB — TYPE AND SCREEN
ABO/RH(D): O POS
Antibody Screen: NEGATIVE

## 2021-12-29 LAB — CBC
HCT: 48.9 % (ref 39.0–52.0)
Hemoglobin: 16.2 g/dL (ref 13.0–17.0)
MCH: 28.9 pg (ref 26.0–34.0)
MCHC: 33.1 g/dL (ref 30.0–36.0)
MCV: 87.3 fL (ref 80.0–100.0)
Platelets: 246 10*3/uL (ref 150–400)
RBC: 5.6 MIL/uL (ref 4.22–5.81)
RDW: 12.6 % (ref 11.5–15.5)
WBC: 9 10*3/uL (ref 4.0–10.5)
nRBC: 0 % (ref 0.0–0.2)

## 2021-12-29 LAB — SURGICAL PCR SCREEN
MRSA, PCR: NEGATIVE
Staphylococcus aureus: NEGATIVE

## 2021-12-29 NOTE — Pre-Procedure Instructions (Signed)
Surgical Instructions    Your procedure is scheduled on Wednesday September 6.  Report to [facility] Main Entrance "A" at 6:30 A.M., then check in with the Admitting office.  Call this number if you have problems the morning of surgery:  [phone]   If you have any questions prior to your surgery date call [phone]: Open Monday-Friday 8am-4pm    Remember:  Do not eat after midnight the night before your surgery  You may drink clear liquids until 5:30 the morning of your surgery.   Clear liquids allowed are: Water, Non-Citrus Juices (without pulp), Carbonated Beverages, Clear Tea, Black Coffee ONLY (NO MILK, CREAM OR POWDERED CREAMER of any kind), and Gatorade    Patient Instructions  The night before surgery:  No food after midnight. ONLY clear liquids after midnight  The day of surgery (if you have diabetes): Drink ONE (1) 12 oz G2 given to you in your pre admission testing appointment by 5:30 the morning of surgery. Drink in one sitting. Do not sip.  This drink was given to you during your hospital  pre-op appointment visit.  Nothing else to drink after completing the  12 oz bottle of G2.         If you have questions, please contact your surgeon's office.   Take these medicines the morning of surgery with A SIP OF WATER:  amLODipine (NORVASC) citalopram (CELEXA) nebivolol (BYSTOLIC) rosuvastatin (CRESTOR)  If Needed: loratadine (CLARITIN) polyvinyl alcohol (LIQUIFILM TEARS)    As of today, STOP taking any Aspirin (unless otherwise instructed by your surgeon) Aleve, Naproxen, Ibuprofen, Motrin, Advil, Goody's, BC's, all herbal medications, fish oil, and all vitamins.  WHAT DO I DO ABOUT MY DIABETES MEDICATION?   Do not take metFORMIN (GLUCOPHAGE) the morning of surgery.   THE NIGHT BEFORE SURGERY, take 10 units of insulin glargine (LANTUS SOLOSTAR) if taken at night.    THE MORNING OF SURGERY, take 10 units of insulin glargine (LANTUS SOLOSTAR) if taken in  morning.   HOW TO MANAGE YOUR DIABETES BEFORE AND AFTER SURGERY  Why is it important to control my blood sugar before and after surgery? Improving blood sugar levels before and after surgery helps healing and can limit problems. A way of improving blood sugar control is eating a healthy diet by:  Eating less sugar and carbohydrates  Increasing activity/exercise  Talking with your doctor about reaching your blood sugar goals High blood sugars (greater than 180 mg/dL) can raise your risk of infections and slow your recovery, so you will need to focus on controlling your diabetes during the weeks before surgery. Make sure that the doctor who takes care of your diabetes knows about your planned surgery including the date and location.  How do I manage my blood sugar before surgery? Check your blood sugar at least 4 times a day, starting 2 days before surgery, to make sure that the level is not too high or low.  Check your blood sugar the morning of your surgery when you wake up and every 2 hours until you get to the Short Stay unit.  If your blood sugar is less than 70 mg/dL, you will need to treat for low blood sugar: Do not take insulin. Treat a low blood sugar (less than 70 mg/dL) with  cup of clear juice (cranberry or apple), 4 glucose tablets, OR glucose gel. Recheck blood sugar in 15 minutes after treatment (to make sure it is greater than 70 mg/dL). If your blood sugar is not  greater than 70 mg/dL on recheck, call [phone] for further instructions. Report your blood sugar to the short stay nurse when you get to Short Stay.  If you are admitted to the hospital after surgery: Your blood sugar will be checked by the staff and you will probably be given insulin after surgery (instead of oral diabetes medicines) to make sure you have good blood sugar levels. The goal for blood sugar control after surgery is 80-180 mg/dL.           Do not wear jewelry or makeup. Do not wear lotions,  powders, cologne or deodorant. Men may shave face and neck. Do not bring valuables to the hospital.  [facility] is not responsible for any belongings or valuables.    Do NOT Smoke (Tobacco/Vaping)  24 hours prior to your procedure  If you use a CPAP at night, you may bring your mask for your overnight stay.   Contacts, glasses, hearing aids, dentures or partials may not be worn into surgery, please bring cases for these belongings   For patients admitted to the hospital, discharge time will be determined by your treatment team.   Patients discharged the day of surgery will not be allowed to drive home, and someone needs to stay with them for 24 hours.   SURGICAL WAITING ROOM VISITATION Patients having surgery or a procedure may have no more than 2 support people in the waiting area - these visitors may rotate.   Children under the age of 81 must have an adult with them who is not the patient. If the patient needs to stay at the hospital during part of their recovery, the visitor guidelines for inpatient rooms apply. Pre-op nurse will coordinate an appropriate time for 1 support person to accompany patient in pre-op.  This support person may not rotate.   Please refer to the [facility] website for the visitor guidelines for Inpatients (after your surgery is over and you are in a regular room).    Special instructions:    Oral Hygiene is also important to reduce your risk of infection.  Remember - BRUSH YOUR TEETH THE MORNING OF SURGERY WITH YOUR REGULAR TOOTHPASTE   [place]- Preparing For Surgery  Before surgery, you can play an important role. Because skin is not sterile, your skin needs to be as free of germs as possible. You can reduce the number of germs on your skin by washing with CHG (chlorahexidine gluconate) Soap before surgery.  CHG is an antiseptic cleaner which kills germs and bonds with the skin to continue killing germs even after washing.     Please do not use  if you have an allergy to CHG or antibacterial soaps. If your skin becomes reddened/irritated stop using the CHG.  Do not shave (including legs and underarms) for at least 48 hours prior to first CHG shower. It is OK to shave your face.  Please follow these instructions carefully.     Shower the NIGHT BEFORE SURGERY and the MORNING OF SURGERY with CHG Soap.   If you chose to wash your hair, wash your hair first as usual with your normal shampoo. After you shampoo, rinse your hair and body thoroughly to remove the shampoo.  Then [employer] and genitals (private parts) with your normal soap and rinse thoroughly to remove soap.  After that Use CHG Soap as you would any other liquid soap. You can apply CHG directly to the skin and wash gently with a scrungie or a  clean washcloth.   Apply the CHG Soap to your body ONLY FROM THE NECK DOWN.  Do not use on open wounds or open sores. Avoid contact with your eyes, ears, mouth and genitals (private parts). Wash Face and genitals (private parts)  with your normal soap.   Wash thoroughly, paying special attention to the area where your surgery will be performed.  Thoroughly rinse your body with warm water from the neck down.  DO NOT shower/wash with your normal soap after using and rinsing off the CHG Soap.  Pat yourself dry with a CLEAN TOWEL.  Wear CLEAN PAJAMAS to bed the night before surgery  Place CLEAN SHEETS on your bed the night before your surgery  DO NOT SLEEP WITH PETS.   Day of Surgery:  Take a shower with CHG soap. Wear Clean/Comfortable clothing the morning of surgery Do not apply any deodorants/lotions.   Remember to brush your teeth WITH YOUR REGULAR TOOTHPASTE.    If you received a COVID test during your pre-op visit, it is requested that you wear a mask when out in public, stay away from anyone that may not be feeling well, and notify your surgeon if you develop symptoms. If you have been in contact with anyone that has  tested positive in the last 10 days, please notify your surgeon.    Please read over the following fact sheets that you were given.

## 2021-12-29 NOTE — Progress Notes (Signed)
PCP - Steven Palmer MD Cardiologist - Patwardhan  PPM/ICD - denies  Chest x-ray - 2020 EKG - 12/11/21 Stress Test - 03/05/20 ECHO - 02/26/20 Cardiac Cath - denies  Sleep Study - yes with dx CPAP - yes  Fasting Blood Sugar - 135-160 Checks Blood Sugar occasionally  As of today, STOP taking any Aspirin (unless otherwise instructed by your surgeon) Aleve, Naproxen, Ibuprofen, Motrin, Advil, Goody's, BC's, all herbal medications, fish oil, and all vitamins.  ERAS Protcol - yes PRE-SURGERY Ensure or G2- G2  COVID TEST- n/a  Anesthesia review: yes  Patient denies shortness of breath, fever, cough and chest pain at PAT appointment   All instructions explained to the patient, with a verbal understanding of the material. Patient agrees to go over the instructions while at home for a better understanding. Patient also instructed to self quarantine after being tested for COVID-19. The opportunity to ask questions was provided.

## 2021-12-30 NOTE — Progress Notes (Incomplete)
Anesthesia Chart Review:  Follows cardiology for history of elevated coronary calcium score, HTN, HLD.  He had an abnormal stress test November 2021 that was ultimately felt to be low risk.  Per Dr. Damian Leavell note, "Nuclear stress test (03/2020) reported as "moderate defect in the lateral, inferior and apical regions, due to prominent gut uptake artifact possibly large hiatal hernia noted in the inferolateral wall. In the absence of wall motion abnormality and normal LVEF, ischemia is less likely". However, elevated calcium score with in all three vessels suggest ischemia cannot be excluded. There is no left main calcification. He does not have any angina symptoms. Reasonable continue medical management for now."  Patient last seen by Dr. Rosemary Holms 12/11/2021 and cleared for surgery at that time with low risk from cardiac standpoint.  Okay to hold aspirin for 5 days.  OSA on CPAP.  IDDM 2.  Preop CBC reviewed, WNL.  BMP and A1c were not done at preop visit due to recent note from cardiologist stating patient had labs 12/08/2021.  Reached out to cardiologist for these labs and evidently this was an error and the labs were incorrectly dated.  No recent labs on file.  I reached out to PCP and they did not have recent chemistry on file but they did have recent A1c from 12/08/2021 which was 8.8.  Patient will need BMP on day of surgery.  EKG 12/11/2021: Sinus rhythm 66 bpm Poor R wave progression   Lexiscan (Walking with mod Bruce)Tetrofosmin Stress Test  03/05/2020: Nondiagnostic ECG stress, pharmacologic stresss. There is a  moderate defect in the lateral, inferior and apical regions, due to prominent gut uptake artifact possibly large hiatal hernia noted in the inferolateral wall. In the absence of wall motion abnormality and normal LVEF, ischemia is less likely. Overall LV systolic function is normal without regional wall motion abnormalities. Stress LV EF: 62%.  No previous exam available for  comparison. Low risk. Clinical correlation recommended.   Echocardiogram 02/26/2020:  Left ventricle cavity is normal in size and wall thickness. Normal global  wall motion. Normal LV systolic function with EF 65%. Normal diastolic  filling pattern.  No significant valvular abnormality.  normal right atrial pressure.    CT Cardiac scoring 02/28/2020: Total: 287 LM: 0 LAD: 99 LCx: 126 RCA: 62 Cardiac anatomy: Normal Visualized thorax: RLL linear atelectasis/scarring   Antionette Poles, PA-C [facility] Short Stay Center/Anesthesiology Phone [phone] 12/31/2021 12:32 PM

## 2021-12-31 NOTE — Anesthesia Preprocedure Evaluation (Addendum)
Anesthesia Evaluation  Patient identified by MRN, date of birth, ID band Patient awake    Reviewed: Allergy & Precautions, NPO status , Patient's Chart, lab work & pertinent test results  History of Anesthesia Complications Negative for: history of anesthetic complications  Airway Mallampati: IV  TM Distance: >3 FB Neck ROM: Full    Dental  (+) Teeth Intact, Dental Advisory Given   Pulmonary sleep apnea (doesnt always wear CPAP) and Continuous Positive Airway Pressure Ventilation , former smoker,  Quit smoking 2008   Pulmonary exam normal breath sounds clear to auscultation       Cardiovascular hypertension (139/81 in preop), Pt. on medications Normal cardiovascular exam Rhythm:Regular Rate:Normal   had an abnormal stress test November 2021 that was ultimately felt to be low risk.  Per Dr. Bonney Roussel note, "Nuclear stress test(03/2020)reported as "moderate defect in the lateral, inferior and apical regions, due to prominent gut uptake artifact possibly large hiatal hernia noted in the inferolateral wall. In the absence of wall motion abnormality and normal LVEF, ischemia is less likely". However, elevated calcium score within all three vessels suggest ischemia cannot be excluded. There is no left main calcification. He does not haveany angina symptoms.Reasonable continue medical management for now."  Patient last seen by Dr. Virgina Jock 12/11/2021 and cleared for surgery at that time with low risk from cardiac standpoint.     Neuro/Psych  Headaches, PSYCHIATRIC DISORDERS Anxiety    GI/Hepatic negative GI ROS, Neg liver ROS,   Endo/Other  diabetes, Poorly Controlled, Type 2, Insulin Dependent, Oral Hypoglycemic AgentsObesity BMI 38 FS 140 @ 10am a1c 8  Renal/GU negative Renal ROS  negative genitourinary   Musculoskeletal  (+) Arthritis , Osteoarthritis,    Abdominal (+) + obese,   Peds negative pediatric ROS (+)   Hematology negative hematology ROS (+) Hb 162, plt 246   Anesthesia Other Findings   Reproductive/Obstetrics negative OB ROS                           Anesthesia Physical Anesthesia Plan  ASA: 3  Anesthesia Plan: General   Post-op Pain Management: Ofirmev IV (intra-op)*, Dilaudid IV and Ketamine IV*   Induction: Intravenous  PONV Risk Score and Plan: 2 and Ondansetron, Dexamethasone, Midazolam and Treatment may vary due to age or medical condition  Airway Management Planned: Oral ETT  Additional Equipment: None  Intra-op Plan:   Post-operative Plan: Extubation in OR  Informed Consent: I have reviewed the patients History and Physical, chart, labs and discussed the procedure including the risks, benefits and alternatives for the proposed anesthesia with the patient or authorized representative who has indicated his/her understanding and acceptance.     Dental advisory given  Plan Discussed with: CRNA  Anesthesia Plan Comments: ( 12/2020 airway note: Ventilation: Mask ventilation without difficulty and Oral airway inserted - appropriate to patient size Laryngoscope Size: Mac and 3 Grade View: Grade II Tube type: Oral Tube size: 7.5 mm Number of attempts: 1)     Anesthesia Quick Evaluation

## 2022-01-04 ENCOUNTER — Encounter: Payer: Self-pay | Admitting: Family Medicine

## 2022-01-05 MED ORDER — VANCOMYCIN HCL 1500 MG/300ML IV SOLN
1500.0000 mg | INTRAVENOUS | Status: AC
  Administered 2022-01-06: 1500 mg via INTRAVENOUS
  Filled 2022-01-05: qty 300

## 2022-01-06 ENCOUNTER — Inpatient Hospital Stay (HOSPITAL_COMMUNITY): Payer: Medicare Other | Admitting: Physician Assistant

## 2022-01-06 ENCOUNTER — Encounter (HOSPITAL_COMMUNITY): Payer: Self-pay | Admitting: Orthopedic Surgery

## 2022-01-06 ENCOUNTER — Other Ambulatory Visit: Payer: Self-pay

## 2022-01-06 ENCOUNTER — Ambulatory Visit (HOSPITAL_COMMUNITY)
Admission: RE | Disposition: A | Discharge status: Home / Self Care | Payer: Self-pay | Source: Home / Self Care | Attending: Orthopedic Surgery

## 2022-01-06 ENCOUNTER — Inpatient Hospital Stay (HOSPITAL_COMMUNITY): Payer: Medicare Other

## 2022-01-06 ENCOUNTER — Observation Stay (HOSPITAL_COMMUNITY)
Admission: RE | Admit: 2022-01-06 | Discharge: 2022-01-07 | Disposition: A | Discharge status: Home / Self Care | Payer: Medicare Other | Attending: Orthopedic Surgery | Admitting: Orthopedic Surgery

## 2022-01-06 DIAGNOSIS — M532X7 Spinal instabilities, lumbosacral region: Secondary | ICD-10-CM | POA: Diagnosis not present

## 2022-01-06 DIAGNOSIS — M5416 Radiculopathy, lumbar region: Secondary | ICD-10-CM | POA: Insufficient documentation

## 2022-01-06 DIAGNOSIS — Z87891 Personal history of nicotine dependence: Secondary | ICD-10-CM | POA: Diagnosis not present

## 2022-01-06 DIAGNOSIS — M5137 Other intervertebral disc degeneration, lumbosacral region: Secondary | ICD-10-CM | POA: Diagnosis not present

## 2022-01-06 DIAGNOSIS — Z7982 Long term (current) use of aspirin: Secondary | ICD-10-CM | POA: Diagnosis not present

## 2022-01-06 DIAGNOSIS — Z79899 Other long term (current) drug therapy: Secondary | ICD-10-CM | POA: Insufficient documentation

## 2022-01-06 DIAGNOSIS — E119 Type 2 diabetes mellitus without complications: Secondary | ICD-10-CM | POA: Insufficient documentation

## 2022-01-06 DIAGNOSIS — I10 Essential (primary) hypertension: Secondary | ICD-10-CM | POA: Insufficient documentation

## 2022-01-06 DIAGNOSIS — M5410 Radiculopathy, site unspecified: Secondary | ICD-10-CM | POA: Diagnosis present

## 2022-01-06 DIAGNOSIS — E039 Hypothyroidism, unspecified: Secondary | ICD-10-CM | POA: Diagnosis not present

## 2022-01-06 DIAGNOSIS — Z96641 Presence of right artificial hip joint: Secondary | ICD-10-CM | POA: Insufficient documentation

## 2022-01-06 DIAGNOSIS — M4807 Spinal stenosis, lumbosacral region: Secondary | ICD-10-CM

## 2022-01-06 DIAGNOSIS — Z794 Long term (current) use of insulin: Secondary | ICD-10-CM | POA: Diagnosis not present

## 2022-01-06 DIAGNOSIS — Z7984 Long term (current) use of oral hypoglycemic drugs: Secondary | ICD-10-CM | POA: Insufficient documentation

## 2022-01-06 HISTORY — PX: TRANSFORAMINAL LUMBAR INTERBODY FUSION (TLIF) WITH PEDICLE SCREW FIXATION 2 LEVEL: SHX6142

## 2022-01-06 LAB — BASIC METABOLIC PANEL
Anion gap: 9 (ref 5–15)
BUN: 10 mg/dL (ref 8–23)
CO2: 23 mmol/L (ref 22–32)
Calcium: 9.7 mg/dL (ref 8.9–10.3)
Chloride: 105 mmol/L (ref 98–111)
Creatinine, Ser: 0.88 mg/dL (ref 0.61–1.24)
GFR, Estimated: >60 mL/min (ref >60)
Glucose, Bld: 140 mg/dL — ABNORMAL HIGH (ref 70–99)
Potassium: 3.8 mmol/L (ref 3.5–5.1)
Sodium: 137 mmol/L (ref 135–145)

## 2022-01-06 LAB — GLUCOSE, CAPILLARY
Glucose-Capillary: 163 mg/dL — ABNORMAL HIGH (ref 70–99)
Glucose-Capillary: 140 mg/dL — ABNORMAL HIGH (ref 70–99)
Glucose-Capillary: 121 mg/dL — ABNORMAL HIGH (ref 70–99)
Glucose-Capillary: 177 mg/dL — ABNORMAL HIGH (ref 70–99)
Glucose-Capillary: 185 mg/dL — ABNORMAL HIGH (ref 70–99)
Glucose-Capillary: 212 mg/dL — ABNORMAL HIGH (ref 70–99)

## 2022-01-06 SURGERY — TRANSFORAMINAL LUMBAR INTERBODY FUSION (TLIF) WITH PEDICLE SCREW FIXATION 2 LEVEL
Anesthesia: General | Site: Spine Lumbar | Laterality: Right

## 2022-01-06 MED ORDER — SODIUM CHLORIDE 0.9 % IV SOLN: 250.0000 mL | INTRAVENOUS | Status: DC

## 2022-01-06 MED ORDER — DEXAMETHASONE SODIUM PHOSPHATE 10 MG/ML IJ SOLN
INTRAMUSCULAR | Status: DC | PRN
  Administered 2022-01-06: 10 mg via INTRAVENOUS

## 2022-01-06 MED ORDER — ONDANSETRON HCL 4 MG/2ML IJ SOLN: 4.0000 mg | Freq: Four times a day (QID) | INTRAMUSCULAR | Status: DC | PRN

## 2022-01-06 MED ORDER — SODIUM CHLORIDE 0.9% FLUSH: 3.0000 mL | INTRAVENOUS | Status: DC | PRN

## 2022-01-06 MED ORDER — BUPIVACAINE LIPOSOME 1.3 % IJ SUSP
INTRAMUSCULAR | Status: DC | PRN
  Administered 2022-01-06: 20 mL

## 2022-01-06 MED ORDER — OXYCODONE HCL 5 MG PO TABS: 5.0000 mg | ORAL_TABLET | Freq: Once | ORAL | Status: DC | PRN

## 2022-01-06 MED ORDER — BUPIVACAINE-EPINEPHRINE (PF) 0.25% -1:200000 IJ SOLN
INTRAMUSCULAR | Status: AC
  Filled 2022-01-06: qty 30

## 2022-01-06 MED ORDER — BUPIVACAINE-EPINEPHRINE (PF) 0.25% -1:200000 IJ SOLN
INTRAMUSCULAR | Status: AC
Start: 2022-01-06 — End: ?
  Filled 2022-01-06: qty 30

## 2022-01-06 MED ORDER — HYDROMORPHONE HCL 1 MG/ML IJ SOLN: 0.2500 mg | INTRAMUSCULAR | Status: DC | PRN

## 2022-01-06 MED ORDER — LORATADINE 10 MG PO TABS
10.0000 mg | ORAL_TABLET | Freq: Every day | ORAL | Status: DC | PRN
Start: 2022-01-06 — End: 2022-01-07

## 2022-01-06 MED ORDER — ONDANSETRON HCL 4 MG PO TABS: 4.0000 mg | ORAL_TABLET | Freq: Four times a day (QID) | ORAL | Status: DC | PRN

## 2022-01-06 MED ORDER — LIDOCAINE 2% (20 MG/ML) 5 ML SYRINGE
INTRAMUSCULAR | Status: AC
Start: 2022-01-06 — End: ?
  Filled 2022-01-06: qty 10

## 2022-01-06 MED ORDER — THROMBIN (RECOMBINANT) 20000 UNITS EX SOLR
CUTANEOUS | Status: AC
Start: 2022-01-06 — End: ?
  Filled 2022-01-06: qty 20000

## 2022-01-06 MED ORDER — ZOLPIDEM TARTRATE 5 MG PO TABS: 5.0000 mg | ORAL_TABLET | Freq: Every evening | ORAL | Status: DC | PRN

## 2022-01-06 MED ORDER — POTASSIUM CHLORIDE IN NACL 20-0.9 MEQ/L-% IV SOLN: INTRAVENOUS | Status: DC

## 2022-01-06 MED ORDER — LIDOCAINE 2% (20 MG/ML) 5 ML SYRINGE
INTRAMUSCULAR | Status: DC | PRN
  Administered 2022-01-06: 100 mg via INTRAVENOUS

## 2022-01-06 MED ORDER — SODIUM CHLORIDE 0.9% FLUSH: 3.0000 mL | Freq: Two times a day (BID) | INTRAVENOUS | Status: DC

## 2022-01-06 MED ORDER — PROPOFOL 500 MG/50ML IV EMUL
INTRAVENOUS | Status: DC | PRN
  Administered 2022-01-06: 50 ug/kg/min via INTRAVENOUS

## 2022-01-06 MED ORDER — ROCURONIUM BROMIDE 10 MG/ML (PF) SYRINGE
PREFILLED_SYRINGE | INTRAVENOUS | Status: DC | PRN
  Administered 2022-01-06: 20 mg via INTRAVENOUS
  Administered 2022-01-06: 30 mg via INTRAVENOUS
  Administered 2022-01-06: 10 mg via INTRAVENOUS
  Administered 2022-01-06: 70 mg via INTRAVENOUS

## 2022-01-06 MED ORDER — PROPOFOL 10 MG/ML IV BOLUS
INTRAVENOUS | Status: AC
  Filled 2022-01-06: qty 20

## 2022-01-06 MED ORDER — EPHEDRINE SULFATE-NACL 50-0.9 MG/10ML-% IV SOSY
PREFILLED_SYRINGE | INTRAVENOUS | Status: DC | PRN
  Administered 2022-01-06 (×2): 5 mg via INTRAVENOUS

## 2022-01-06 MED ORDER — INSULIN GLARGINE-YFGN 100 UNIT/ML ~~LOC~~ SOLN
20.0000 [IU] | Freq: Every day | SUBCUTANEOUS | Status: DC
  Filled 2022-01-06: qty 0.2

## 2022-01-06 MED ORDER — HYDROMORPHONE HCL 1 MG/ML IJ SOLN
INTRAMUSCULAR | Status: AC
  Filled 2022-01-06: qty 0.5

## 2022-01-06 MED ORDER — METHYLENE BLUE 1 % INJ SOLN
INTRAVENOUS | Status: AC
Start: 2022-01-06 — End: ?
  Filled 2022-01-06: qty 10

## 2022-01-06 MED ORDER — 0.9 % SODIUM CHLORIDE (POUR BTL) OPTIME
TOPICAL | Status: DC | PRN
  Administered 2022-01-06 (×2): 1000 mL

## 2022-01-06 MED ORDER — MIDAZOLAM HCL 2 MG/2ML IJ SOLN
INTRAMUSCULAR | Status: DC | PRN
  Administered 2022-01-06 (×2): 1 mg via INTRAVENOUS

## 2022-01-06 MED ORDER — KETAMINE HCL 10 MG/ML IJ SOLN
INTRAMUSCULAR | Status: DC | PRN
  Administered 2022-01-06: 30 mg via INTRAVENOUS
  Administered 2022-01-06 (×2): 10 mg via INTRAVENOUS

## 2022-01-06 MED ORDER — FLEET ENEMA 7-19 GM/118ML RE ENEM: 1.0000 | ENEMA | Freq: Once | RECTAL | Status: DC | PRN

## 2022-01-06 MED ORDER — INSULIN ASPART 100 UNIT/ML IJ SOLN
INTRAMUSCULAR | Status: DC | PRN
  Administered 2022-01-06: 2 [IU] via SUBCUTANEOUS

## 2022-01-06 MED ORDER — ROCURONIUM BROMIDE 10 MG/ML (PF) SYRINGE
PREFILLED_SYRINGE | INTRAVENOUS | Status: AC
  Filled 2022-01-06: qty 20

## 2022-01-06 MED ORDER — ACETAMINOPHEN 325 MG PO TABS: 650.0000 mg | ORAL_TABLET | ORAL | Status: DC | PRN

## 2022-01-06 MED ORDER — PHENOL 1.4 % MT LIQD: 1.0000 | OROMUCOSAL | Status: DC | PRN

## 2022-01-06 MED ORDER — ONDANSETRON HCL 4 MG/2ML IJ SOLN
INTRAMUSCULAR | Status: DC | PRN
  Administered 2022-01-06: 4 mg via INTRAVENOUS

## 2022-01-06 MED ORDER — INSULIN ASPART 100 UNIT/ML IJ SOLN
0.0000 [IU] | INTRAMUSCULAR | Status: AC | PRN
  Administered 2022-01-06 (×2): 2 [IU] via SUBCUTANEOUS
  Filled 2022-01-06 (×2): qty 1

## 2022-01-06 MED ORDER — ROSUVASTATIN CALCIUM 20 MG PO TABS: 20.0000 mg | ORAL_TABLET | Freq: Every day | ORAL | Status: DC

## 2022-01-06 MED ORDER — ACETAMINOPHEN 500 MG PO TABS
ORAL_TABLET | ORAL | Status: AC
  Filled 2022-01-06: qty 2

## 2022-01-06 MED ORDER — THROMBIN 20000 UNITS EX SOLR
CUTANEOUS | Status: AC
  Filled 2022-01-06: qty 20000

## 2022-01-06 MED ORDER — VITAMIN D 25 MCG (1000 UNIT) PO TABS
5000.0000 [IU] | ORAL_TABLET | Freq: Every day | ORAL | Status: DC
  Administered 2022-01-06: 5000 [IU] via ORAL
  Filled 2022-01-06 (×3): qty 5

## 2022-01-06 MED ORDER — PHENYLEPHRINE HCL-NACL 20-0.9 MG/250ML-% IV SOLN
INTRAVENOUS | Status: DC | PRN
  Administered 2022-01-06: 30 ug/min via INTRAVENOUS

## 2022-01-06 MED ORDER — ACETAMINOPHEN 650 MG RE SUPP: 650.0000 mg | RECTAL | Status: DC | PRN

## 2022-01-06 MED ORDER — VANCOMYCIN HCL 1250 MG/250ML IV SOLN
1250.0000 mg | Freq: Two times a day (BID) | INTRAVENOUS | Status: DC
Start: 2022-01-06 — End: 2022-01-07
  Administered 2022-01-06: 1250 mg via INTRAVENOUS
  Filled 2022-01-06 (×2): qty 250

## 2022-01-06 MED ORDER — BUPIVACAINE LIPOSOME 1.3 % IJ SUSP
INTRAMUSCULAR | Status: AC
  Filled 2022-01-06: qty 20

## 2022-01-06 MED ORDER — ALUM & MAG HYDROXIDE-SIMETH 200-200-20 MG/5ML PO SUSP: 30.0000 mL | Freq: Four times a day (QID) | ORAL | Status: DC | PRN

## 2022-01-06 MED ORDER — FENTANYL CITRATE (PF) 250 MCG/5ML IJ SOLN
INTRAMUSCULAR | Status: AC
  Filled 2022-01-06: qty 5

## 2022-01-06 MED ORDER — FUROSEMIDE 40 MG PO TABS
40.0000 mg | ORAL_TABLET | Freq: Every day | ORAL | Status: DC
  Filled 2022-01-06: qty 1

## 2022-01-06 MED ORDER — CHLORHEXIDINE GLUCONATE 0.12 % MT SOLN: 15.0000 mL | Freq: Once | OROMUCOSAL | Status: AC

## 2022-01-06 MED ORDER — SENNOSIDES-DOCUSATE SODIUM 8.6-50 MG PO TABS: 1.0000 | ORAL_TABLET | Freq: Every evening | ORAL | Status: DC | PRN

## 2022-01-06 MED ORDER — DEXAMETHASONE SODIUM PHOSPHATE 10 MG/ML IJ SOLN
INTRAMUSCULAR | Status: AC
  Filled 2022-01-06: qty 2

## 2022-01-06 MED ORDER — BUPIVACAINE-EPINEPHRINE 0.25% -1:200000 IJ SOLN
INTRAMUSCULAR | Status: DC | PRN
  Administered 2022-01-06: 10 mL
  Administered 2022-01-06: 20 mL

## 2022-01-06 MED ORDER — HYDROMORPHONE HCL 1 MG/ML IJ SOLN
INTRAMUSCULAR | Status: DC | PRN
  Administered 2022-01-06 (×2): .5 mg via INTRAVENOUS

## 2022-01-06 MED ORDER — OXYCODONE HCL 5 MG/5ML PO SOLN: 5.0000 mg | Freq: Once | ORAL | Status: DC | PRN

## 2022-01-06 MED ORDER — OXYCODONE-ACETAMINOPHEN 5-325 MG PO TABS
1.0000 | ORAL_TABLET | ORAL | Status: DC | PRN
  Administered 2022-01-06 – 2022-01-07 (×3): 2 via ORAL
  Filled 2022-01-06 (×3): qty 2

## 2022-01-06 MED ORDER — HYDROCODONE-ACETAMINOPHEN 5-325 MG PO TABS
1.0000 | ORAL_TABLET | ORAL | Status: DC | PRN
  Filled 2022-01-06: qty 1

## 2022-01-06 MED ORDER — CITALOPRAM HYDROBROMIDE 20 MG PO TABS: 40.0000 mg | ORAL_TABLET | Freq: Every day | ORAL | Status: DC

## 2022-01-06 MED ORDER — METHOCARBAMOL 1000 MG/10ML IJ SOLN: 500.0000 mg | Freq: Four times a day (QID) | INTRAVENOUS | Status: DC | PRN

## 2022-01-06 MED ORDER — ONDANSETRON HCL 4 MG/2ML IJ SOLN: 4.0000 mg | Freq: Once | INTRAMUSCULAR | Status: DC | PRN

## 2022-01-06 MED ORDER — AMISULPRIDE (ANTIEMETIC) 5 MG/2ML IV SOLN
10.0000 mg | Freq: Once | INTRAVENOUS | Status: DC | PRN
Start: 2022-01-06 — End: 2022-01-06

## 2022-01-06 MED ORDER — MORPHINE SULFATE (PF) 2 MG/ML IV SOLN: 1.0000 mg | INTRAVENOUS | Status: DC | PRN

## 2022-01-06 MED ORDER — EPHEDRINE 5 MG/ML INJ
INTRAVENOUS | Status: AC
Start: 2022-01-06 — End: ?
  Filled 2022-01-06: qty 5

## 2022-01-06 MED ORDER — BISACODYL 5 MG PO TBEC: 5.0000 mg | DELAYED_RELEASE_TABLET | Freq: Every day | ORAL | Status: DC | PRN

## 2022-01-06 MED ORDER — SUCCINYLCHOLINE CHLORIDE 200 MG/10ML IV SOSY
PREFILLED_SYRINGE | INTRAVENOUS | Status: AC
Start: 2022-01-06 — End: ?
  Filled 2022-01-06: qty 10

## 2022-01-06 MED ORDER — POLYVINYL ALCOHOL 1.4 % OP SOLN: 1.0000 [drp] | Freq: Four times a day (QID) | OPHTHALMIC | Status: DC | PRN

## 2022-01-06 MED ORDER — KETAMINE HCL 50 MG/5ML IJ SOSY
PREFILLED_SYRINGE | INTRAMUSCULAR | Status: AC
Start: 2022-01-06 — End: ?
  Filled 2022-01-06: qty 5

## 2022-01-06 MED ORDER — ZOLPIDEM TARTRATE 5 MG PO TABS: 10.0000 mg | ORAL_TABLET | Freq: Every evening | ORAL | Status: DC | PRN

## 2022-01-06 MED ORDER — CHLORHEXIDINE GLUCONATE 0.12 % MT SOLN
OROMUCOSAL | Status: AC
  Administered 2022-01-06: 15 mL via OROMUCOSAL
  Filled 2022-01-06: qty 15

## 2022-01-06 MED ORDER — FENTANYL CITRATE (PF) 250 MCG/5ML IJ SOLN
INTRAMUSCULAR | Status: DC | PRN
  Administered 2022-01-06 (×2): 50 ug via INTRAVENOUS
  Administered 2022-01-06: 100 ug via INTRAVENOUS
  Administered 2022-01-06: 50 ug via INTRAVENOUS

## 2022-01-06 MED ORDER — PROPOFOL 10 MG/ML IV BOLUS
INTRAVENOUS | Status: DC | PRN
  Administered 2022-01-06: 200 mg via INTRAVENOUS
  Administered 2022-01-06: 80 mg via INTRAVENOUS

## 2022-01-06 MED ORDER — LACTATED RINGERS IV SOLN: INTRAVENOUS | Status: DC

## 2022-01-06 MED ORDER — THROMBIN 20000 UNITS EX SOLR
CUTANEOUS | Status: DC | PRN
  Administered 2022-01-06: 20000 [IU] via TOPICAL

## 2022-01-06 MED ORDER — METHOCARBAMOL 500 MG PO TABS
500.0000 mg | ORAL_TABLET | Freq: Four times a day (QID) | ORAL | Status: DC | PRN
  Administered 2022-01-06 – 2022-01-07 (×2): 500 mg via ORAL
  Filled 2022-01-06 (×2): qty 1

## 2022-01-06 MED ORDER — POTASSIUM GLUCONATE 595 (99 K) MG PO TABS: 595.0000 mg | ORAL_TABLET | Freq: Every day | ORAL | Status: DC

## 2022-01-06 MED ORDER — ROCURONIUM BROMIDE 10 MG/ML (PF) SYRINGE
PREFILLED_SYRINGE | INTRAVENOUS | Status: AC
Start: 2022-01-06 — End: ?
  Filled 2022-01-06: qty 10

## 2022-01-06 MED ORDER — METFORMIN HCL 500 MG PO TABS
1000.0000 mg | ORAL_TABLET | Freq: Two times a day (BID) | ORAL | Status: DC
  Administered 2022-01-07: 1000 mg via ORAL
  Filled 2022-01-06: qty 2

## 2022-01-06 MED ORDER — AMLODIPINE BESYLATE 5 MG PO TABS: 10.0000 mg | ORAL_TABLET | Freq: Every day | ORAL | Status: DC

## 2022-01-06 MED ORDER — NEBIVOLOL HCL 5 MG PO TABS
5.0000 mg | ORAL_TABLET | Freq: Every day | ORAL | Status: DC
  Filled 2022-01-06: qty 1

## 2022-01-06 MED ORDER — MENTHOL 3 MG MT LOZG: 1.0000 | LOZENGE | OROMUCOSAL | Status: DC | PRN

## 2022-01-06 MED ORDER — POVIDONE-IODINE 7.5 % EX SOLN
Freq: Once | CUTANEOUS | Status: AC
  Filled 2022-01-06: qty 118

## 2022-01-06 MED ORDER — SUGAMMADEX SODIUM 200 MG/2ML IV SOLN
INTRAVENOUS | Status: DC | PRN
  Administered 2022-01-06: 200 mg via INTRAVENOUS

## 2022-01-06 MED ORDER — MIDAZOLAM HCL 2 MG/2ML IJ SOLN
INTRAMUSCULAR | Status: AC
  Filled 2022-01-06: qty 2

## 2022-01-06 MED ORDER — ORAL CARE MOUTH RINSE: 15.0000 mL | Freq: Once | OROMUCOSAL | Status: AC

## 2022-01-06 MED ORDER — PHENYLEPHRINE 80 MCG/ML (10ML) SYRINGE FOR IV PUSH (FOR BLOOD PRESSURE SUPPORT)
PREFILLED_SYRINGE | INTRAVENOUS | Status: AC
  Filled 2022-01-06: qty 20

## 2022-01-06 MED ORDER — DOCUSATE SODIUM 100 MG PO CAPS
100.0000 mg | ORAL_CAPSULE | Freq: Two times a day (BID) | ORAL | Status: DC
  Administered 2022-01-06: 100 mg via ORAL
  Filled 2022-01-06: qty 1

## 2022-01-06 MED ORDER — ONDANSETRON HCL 4 MG/2ML IJ SOLN
INTRAMUSCULAR | Status: AC
Start: 2022-01-06 — End: ?
  Filled 2022-01-06: qty 4

## 2022-01-06 SURGICAL SUPPLY — 89 items
APL SKNCLS STERI-STRIP NONHPOA (GAUZE/BANDAGES/DRESSINGS) ×1
BAG COUNTER SPONGE SURGICOUNT (BAG) ×1 IMPLANT
BAG SPNG CNTER NS LX DISP (BAG) ×1
BENZOIN TINCTURE AMPULE (MISCELLANEOUS) IMPLANT
BENZOIN TINCTURE PRP APPL 2/3 (GAUZE/BANDAGES/DRESSINGS) ×1 IMPLANT
BLADE CLIPPER SURG (BLADE) IMPLANT
BUR PRESCISION 1.7 ELITE (BURR) ×1 IMPLANT
BUR ROUND FLUTED 5 RND (BURR) ×1 IMPLANT
CAGE SABLE 10X30 6-12 8D (Cage) IMPLANT
CAGE SABLE 10X30 7-14 15D (Cage) IMPLANT
CANNULA GRAFT BNE VG PRE-FILL (Bone Implant) IMPLANT
CARTRIDGE OIL MAESTRO DRILL (MISCELLANEOUS) ×1 IMPLANT
CLSR STERI-STRIP ANTIMIC 1/2X4 (GAUZE/BANDAGES/DRESSINGS) IMPLANT
CNTNR URN SCR LID CUP LEK RST (MISCELLANEOUS) ×1 IMPLANT
CONT SPEC 4OZ STRL OR WHT (MISCELLANEOUS) ×1
COVER MAYO STAND STRL (DRAPES) ×2 IMPLANT
COVER SURGICAL LIGHT HANDLE (MISCELLANEOUS) ×1 IMPLANT
DIFFUSER DRILL AIR PNEUMATIC (MISCELLANEOUS) ×1 IMPLANT
DISPENSER GRAFT BNE VG (MISCELLANEOUS) IMPLANT
DISPENSER VIVIGEN BONE GRAFT (MISCELLANEOUS) ×1 IMPLANT
DRAPE C-ARM 42X120 X-RAY (DRAPES) IMPLANT
DRAPE C-ARM 42X72 X-RAY (DRAPES) ×1 IMPLANT
DRAPE C-ARMOR (DRAPES) IMPLANT
DRAPE POUCH INSTRU U-SHP 10X18 (DRAPES) ×1 IMPLANT
DRAPE SURG 17X23 STRL (DRAPES) ×4 IMPLANT
DURAPREP 26ML APPLICATOR (WOUND CARE) ×1 IMPLANT
ELECT BLADE 4.0 EZ CLEAN MEGAD (MISCELLANEOUS) ×1
ELECT CAUTERY BLADE 6.4 (BLADE) ×1 IMPLANT
ELECT REM PT RETURN 9FT ADLT (ELECTROSURGICAL) ×1
ELECTRODE BLDE 4.0 EZ CLN MEGD (MISCELLANEOUS) ×1 IMPLANT
ELECTRODE REM PT RTRN 9FT ADLT (ELECTROSURGICAL) ×1 IMPLANT
FILTER STRAW FLUID ASPIR (MISCELLANEOUS) ×1 IMPLANT
GAUZE 4X4 16PLY ~~LOC~~+RFID DBL (SPONGE) ×1 IMPLANT
GAUZE SPONGE 4X4 12PLY STRL (GAUZE/BANDAGES/DRESSINGS) ×1 IMPLANT
GLOVE BIO SURGEON STRL SZ7 (GLOVE) ×1 IMPLANT
GLOVE BIO SURGEON STRL SZ8 (GLOVE) ×1 IMPLANT
GLOVE BIOGEL PI IND STRL 7.0 (GLOVE) ×1 IMPLANT
GLOVE BIOGEL PI IND STRL 8 (GLOVE) ×1 IMPLANT
GLOVE SURG ENC MOIS LTX SZ6.5 (GLOVE) ×1 IMPLANT
GOWN STRL REUS W/ TWL LRG LVL3 (GOWN DISPOSABLE) ×2 IMPLANT
GOWN STRL REUS W/ TWL XL LVL3 (GOWN DISPOSABLE) ×1 IMPLANT
GOWN STRL REUS W/TWL LRG LVL3 (GOWN DISPOSABLE) ×2
GOWN STRL REUS W/TWL XL LVL3 (GOWN DISPOSABLE) ×1
GRAFT BONE CANNULA VIVIGEN 3 (Bone Implant) ×5 IMPLANT
IV CATH 14GX2 1/4 (CATHETERS) ×1 IMPLANT
KIT BASIN OR (CUSTOM PROCEDURE TRAY) ×1 IMPLANT
KIT POSITION SURG JACKSON T1 (MISCELLANEOUS) ×1 IMPLANT
KIT TURNOVER KIT B (KITS) ×1 IMPLANT
MARKER SKIN DUAL TIP RULER LAB (MISCELLANEOUS) ×2 IMPLANT
NDL 18GX1X1/2 (RX/OR ONLY) (NEEDLE) ×1 IMPLANT
NDL 22X1.5 STRL (OR ONLY) (MISCELLANEOUS) ×2 IMPLANT
NDL HYPO 25GX1X1/2 BEV (NEEDLE) ×1 IMPLANT
NDL SPNL 18GX3.5 QUINCKE PK (NEEDLE) ×2 IMPLANT
NEEDLE 18GX1X1/2 (RX/OR ONLY) (NEEDLE) ×1 IMPLANT
NEEDLE 22X1.5 STRL (OR ONLY) (MISCELLANEOUS) ×2 IMPLANT
NEEDLE HYPO 25GX1X1/2 BEV (NEEDLE) ×1 IMPLANT
NEEDLE SPNL 18GX3.5 QUINCKE PK (NEEDLE) ×2 IMPLANT
NS IRRIG 1000ML POUR BTL (IV SOLUTION) ×1 IMPLANT
OIL CARTRIDGE MAESTRO DRILL (MISCELLANEOUS) ×1
PACK LAMINECTOMY ORTHO (CUSTOM PROCEDURE TRAY) ×1 IMPLANT
PACK UNIVERSAL I (CUSTOM PROCEDURE TRAY) ×1 IMPLANT
PAD ARMBOARD 7.5X6 YLW CONV (MISCELLANEOUS) ×2 IMPLANT
PATTIES SURGICAL .5 X1 (DISPOSABLE) ×1 IMPLANT
PATTIES SURGICAL .5X1.5 (GAUZE/BANDAGES/DRESSINGS) ×1 IMPLANT
ROD EXPEDIUM PREBENT 5.5X50 (Rod) IMPLANT
SCREW CORTICAL VIPER 7X40MM (Screw) IMPLANT
SCREW SET SINGLE INNER (Screw) IMPLANT
SCREW VIPER CORT FIX 6X35 (Screw) IMPLANT
SPONGE INTESTINAL PEANUT (DISPOSABLE) ×1 IMPLANT
SPONGE SURGIFOAM ABS GEL 100 (HEMOSTASIS) ×1 IMPLANT
STRIP CLOSURE SKIN 1/2X4 (GAUZE/BANDAGES/DRESSINGS) ×2 IMPLANT
SUT ETHILON 2 0 FS 18 (SUTURE) IMPLANT
SUT MNCRL AB 4-0 PS2 18 (SUTURE) ×1 IMPLANT
SUT VIC AB 0 CT1 18XCR BRD 8 (SUTURE) ×1 IMPLANT
SUT VIC AB 0 CT1 8-18 (SUTURE) ×2
SUT VIC AB 1 CT1 18XCR BRD 8 (SUTURE) ×1 IMPLANT
SUT VIC AB 1 CT1 8-18 (SUTURE) ×1
SUT VIC AB 2-0 CT2 18 VCP726D (SUTURE) ×1 IMPLANT
SYR 20ML LL LF (SYRINGE) ×2 IMPLANT
SYR BULB IRRIG 60ML STRL (SYRINGE) ×1 IMPLANT
SYR CONTROL 10ML LL (SYRINGE) ×2 IMPLANT
SYR TB 1ML LUER SLIP (SYRINGE) ×1 IMPLANT
TAP EXPEDIUM DL 5.0 (INSTRUMENTS) IMPLANT
TAP EXPEDIUM DL 6.0 (INSTRUMENTS) IMPLANT
TAPE CLOTH SURG 6X10 WHT LF (GAUZE/BANDAGES/DRESSINGS) IMPLANT
TRAY FOLEY MTR SLVR 16FR STAT (SET/KITS/TRAYS/PACK) ×1 IMPLANT
TUBE SUCTION PSI-TEC 12FT (MISCELLANEOUS) IMPLANT
WATER STERILE IRR 1000ML POUR (IV SOLUTION) ×1 IMPLANT
YANKAUER SUCT BULB TIP NO VENT (SUCTIONS) ×1 IMPLANT

## 2022-01-06 NOTE — Transfer of Care (Signed)
Immediate Anesthesia Transfer of Care Note  Patient: Steven Contreras  Procedure(s) Performed: RIGHT-SIDED LUMBAR 4 - LUMBAR 5, LUMBAR 5- SACRUM 1 TRANSFORAMINAL LUMBAR INTERBODY FUSION AND DECOMPRESSION WITH INSTRUMENTATION AND ALLOGRAFT (Right: Spine Lumbar)  Patient Location: PACU  Anesthesia Type:General  Level of Consciousness: drowsy and patient cooperative  Airway & Oxygen Therapy: Patient Spontanous Breathing and Patient connected to face mask oxygen  Post-op Assessment: Report given to RN, Post -op Vital signs reviewed and stable and Patient moving all extremities X 4  Post vital signs: Reviewed and stable  Last Vitals:  Vitals Value Taken Time  BP    Temp    Pulse    Resp    SpO2      Last Pain:  Vitals:   01/06/22 0729  TempSrc:   PainSc: 4       Patients Stated Pain Goal: 4 (01/06/22 0729)  Complications: No notable events documented.

## 2022-01-06 NOTE — Progress Notes (Signed)
Pharmacy Antibiotic Note  Steven Contreras is a 66 y.o. male admitted on 01/06/2022 with surgical prophylaxis.  Pharmacy has been consulted for vancomcyin dosing.  Pt is s/p lumbar decompression and fusion. He does have a drain in place. We will continue vanc until drain is removed.  Scr<1  Plan: Vanc 1.25g IV q12 F/u when drain is removed  Height: 5' 7.5" (171.5 cm) Weight: 110.7 kg (244 lb) IBW/kg (Calculated) : 67.25  Temp (24hrs), Avg:97.8 F (36.6 C), Min:97.5 F (36.4 C), Max:98.4 F (36.9 C)  Recent Labs  Lab 01/06/22 0802  CREATININE 0.88    Estimated Creatinine Clearance: 97.6 mL/min (by C-G formula based on SCr of 0.88 mg/dL).    Allergies  Allergen Reactions   Ace Inhibitors Cough   Ampicillin     NOT sure if its ampicillin or amoxicillin---stomach cramps   Effexor [Venlafaxine] Other (See Comments)    PT DOES NOT REMEMBER REACTION    Wellbutrin [Bupropion] Other (See Comments)    PT DOES NOT REMEMBER REACTION     Antimicrobials this admission:   Dose adjustments this admission:   Microbiology results:  Ulyses Southward, PharmD, BCIDP, AAHIVP, CPP Infectious Disease Pharmacist 01/06/2022 8:12 PM

## 2022-01-06 NOTE — Anesthesia Postprocedure Evaluation (Signed)
Anesthesia Post Note  Patient: Steven Contreras  Procedure(s) Performed: RIGHT-SIDED LUMBAR 4 - LUMBAR 5, LUMBAR 5- SACRUM 1 TRANSFORAMINAL LUMBAR INTERBODY FUSION AND DECOMPRESSION WITH INSTRUMENTATION AND ALLOGRAFT (Right: Spine Lumbar)     Patient location during evaluation: PACU Anesthesia Type: General Level of consciousness: awake and alert, oriented and patient cooperative Pain management: pain level controlled Vital Signs Assessment: post-procedure vital signs reviewed and stable Respiratory status: spontaneous breathing, nonlabored ventilation and respiratory function stable Cardiovascular status: blood pressure returned to baseline and stable Postop Assessment: no apparent nausea or vomiting Anesthetic complications: no   No notable events documented.  Last Vitals:  Vitals:   01/06/22 1745 01/06/22 1800  BP: 133/77 118/74  Pulse: 77 67  Resp: 16 15  Temp:    SpO2: 99% 97%    Last Pain:  Vitals:   01/06/22 1730  TempSrc:   PainSc: Asleep                 Lannie Fields

## 2022-01-06 NOTE — Anesthesia Procedure Notes (Signed)
Procedure Name: Intubation Date/Time: 01/06/2022 12:02 PM  Performed by: Janene Harvey, CRNAPre-anesthesia Checklist: Patient identified, Emergency Drugs available, Suction available and Patient being monitored Patient Re-evaluated:Patient Re-evaluated prior to induction Oxygen Delivery Method: Circle system utilized Preoxygenation: Pre-oxygenation with 100% oxygen Induction Type: IV induction Ventilation: Two handed mask ventilation required and Oral airway inserted - appropriate to patient size Laryngoscope Size: Mac and 4 Grade View: Grade II Tube type: Oral Tube size: 7.5 mm Number of attempts: 1 Airway Equipment and Method: Stylet and Oral airway Placement Confirmation: ETT inserted through vocal cords under direct vision, positive ETCO2 and breath sounds checked- equal and bilateral Secured at: 23 cm Tube secured with: Tape Dental Injury: Teeth and Oropharynx as per pre-operative assessment

## 2022-01-06 NOTE — Op Note (Addendum)
PATIENT NAME: Steven Contreras [facility]   MEDICAL RECORD NO.:   [id number]   DATE OF BIRTH: [birth year]  DATE OF PROCEDURE: 01/06/2022                                                            OPERATIVE REPORT     PREOPERATIVE DIAGNOSES: 1. Bilateral lumbar radiculopathy. 2. L4-5, L5/S1 spinal stenosis. 3. Severe degenerative disc disease, L4/5, L5/S1 4. L4/5 and L5/S1 instability   POSTOPERATIVE DIAGNOSES: 1. Bilateral lumbar radiculopathy. 2. L4-5, L5/S1 spinal stenosis. 3. Severe degenerative disc disease, L4/5, L5/S1 4. L4/5 and L5/S1 instability   PROCEDURES: 1. Complex decompression L4-5, L5-S1. 2. Right-sided L4-5, L5/S1 transforaminal lumbar interbody fusion. 3. Left-sided L4-5, L5/S1 posterolateral fusion. 4. Insertion of interbody device x2 (Globus expandable intervertebral spacers). 5. Placement of posterior instrumentation L4, L5, S1 bilaterally. 6. Use of local autograft. 7. Use of morselized allograft - Vivigen 8. Intraoperative use of fluoroscopy.   SURGEON:  Estill Bamberg, MD.   ASSISTANTJason Coop, PA-C.   ANESTHESIA:  General endotracheal anesthesia.   COMPLICATIONS:  None.   DISPOSITION:  Stable.   ESTIMATED BLOOD LOSS:  425cc (125 retransfused via cell saver)   INDICATIONS FOR SURGERY:  Briefly, Mr. Turnage a pleasant 66 year old male, who did present to me with severe and ongoing pain in the bilateral legs.  His symptoms have been present for well over a year.  I did feel that the symptoms were secondary to the findings noted above.  The patient failed conservative care and did wish to proceed with the procedure noted above.   OPERATIVE DETAILS:  On 01/06/2022, the patient was brought to surgery and general endotracheal anesthesia was administered.  The patient was placed prone on a well-padded flat Jackson bed with a spinal frame.  Antibiotics were given and a time-out procedure was performed. The back was prepped and draped in the usual fashion.  A  midline incision was made overlying the L4-5 and L5-S1 intervertebral spaces.  The fascia was incised at the midline.  The paraspinal musculature was bluntly swept laterally.  Anatomic landmarks for the pedicles were exposed. Using fluoroscopy, I did cannulate the L4, L5, and S1 pedicles bilaterally, using a medial to lateral cortical trajectory technique.  On the left side, the posterolateral gutter and left facet joint at L4-5 and L5-S1 was decorticated and 6 mm screws of the appropriate length were placed at L4, L5, and a 7 mm screw was placed at S1, and a 55-mm rod was placed and distraction was applied across the rod at each intervertebral level. On the right side, the cannulated pedicle holes were filled with bone wax.  I then proceeded with the decompressive aspect of the procedure.     Starting at L5-S1, I did perform a laminotomy bilaterally and a full facetectomy on the right.  The L5/S1 level was entirely decompressed. Once this was done, with an assistant holding medial retraction of the traversing right S1 nerve, I did perform a thorough and complete L5-S1 intervertebral discectomy.  The intervertebral space was then liberally packed with autograft from the decompression, as well as allograft in the form of Vivigen, as was the appropriately sized intervertebral spacer.  The spacer was expanded to 9.1 mm in height. Distraction was then released on the contralateral left  side.  I then turned my attention to the L4-5 level.  It was clearly evident that there was prominent stenosis particularly at the foramen on the right.  It was very meticulous to develop a safe plane between the exiting right L4 nerve and prominent facet hypertrophy.  I was however able to develop a plane and thoroughly decompress the nerve entirely. I was able to thoroughly decompress both the right L4 and right L5 nerves.  With an assistant holding medial retraction of the traversing right L5 nerve, I did perform an annulotomy  at the posterolateral aspect of the L4-5 intervertebral space.  I then used a series of curettes and pituitary rongeurs to perform a thorough and complete intervertebral diskectomy.  The intervertebral space was then liberally packed with autograft as well as allograft in the form of Vivigen, as was the appropriate-sized intervertebral spacer, which was then tamped into position in the usual fashion, and expanded to 9.7 mm in height.  I was very pleased with the press-fit of the spacer.  I then placed 6 mm screws on the right at L4 and L5, and a 7 mm screw at S1 on the right side.  A 55-mm rod was then placed and caps were placed. The distraction was then released on the contralateral left side.  All 6 caps were then locked.  The wound was copiously irrigated with a total of approximately 3 L prior to placing the bone graft.  Additional autograft and allograft were then packed into the posterolateral gutter on the left side to help aid in the success of the fusion.  The wound was  explored for any undue bleeding and there was no substantial bleeding encountered.  Gel-Foam was placed over the laminectomy site.  The wound was then closed in layers using #1 Vicryl followed by 2-0 Vicryl, followed by 4-0 Monocryl.  Benzoin and Steri-Strips were applied followed by sterile dressing.      Of note, Jason Coop was my assistant throughout surgery, and did aid in retraction, placement of the hardware, suctioning, and closure.     Estill Bamberg, MD

## 2022-01-06 NOTE — H&P (Signed)
PREOPERATIVE H&P  Chief Complaint: Bilateral leg pain  HPI: Steven Contreras is a 66 y.o. male who presents with ongoing pain in the bilateral legs, right greater than left  MRI reveals stenosis and instability and L4/5 and L5/S1  Patient has failed multiple forms of conservative care and continues to have pain (see office notes for additional details regarding the patient's full course of treatment)  Past Medical History:  Diagnosis Date   ADD (attention deficit disorder)    Anxiety    hx of   Arthritis    Diabetes (HCC)    Eczema    Family history of adverse reaction to anesthesia    mother slow to awaken   Headache    High cholesterol    History of kidney stones    Hypertension    Hyperthyroidism    Right rotator cuff tear    Sleep apnea    Past Surgical History:  Procedure Laterality Date   APPENDECTOMY     when he was [age]   colonscopy  2014   IR URETERAL STENT LEFT NEW ACCESS W/O SEP NEPHROSTOMY CATH  12/29/2020   NEPHROLITHOTOMY Left 12/29/2020   Procedure: LEFT NEPHROLITHOTOMY PERCUTANEOUS;  Surgeon: Crista Elliot, MD;  Location: [facility];  Service: Urology;  Laterality: Left;   SHOULDER SURGERY Left years ago   rotator, bicep and clavicle repair.   SHOULDER SURGERY Right 2016   TOTAL HIP ARTHROPLASTY Right 10/08/2014   Procedure: RIGHT TOTAL HIP ARTHROPLASTY ANTERIOR APPROACH;  Surgeon: Durene Romans, MD;  Location: [facility];  Service: Orthopedics;  Laterality: Right;   Social History   Socioeconomic History   Marital status: Married    Spouse name: Mary   Number of children: 3   Years of education: Not on file   Highest education level: Not on file  Occupational History   Not on file  Tobacco Use   Smoking status: Former    Packs/day: 0.50    Years: 15.00    Total pack years: 7.50    Types: Cigarettes    Quit date: 05/03/2006    Years since quitting: 15.6   Smokeless tobacco: Never  Vaping Use   Vaping Use: Former  Substance and Sexual  Activity   Alcohol use: Yes    Comment: rarely   Drug use: Never   Sexual activity: Not Currently  Other Topics Concern   Not on file  Social History Narrative   Not on file   Social Determinants of Health   Financial Resource Strain: Not on file  Food Insecurity: Not on file  Transportation Needs: Not on file  Physical Activity: Not on file  Stress: Not on file  Social Connections: Not on file   Family History  Problem Relation Age of Onset   Pancreatic cancer Mother    Prostate cancer Father    Allergic rhinitis Sister    Allergies  Allergen Reactions   Ace Inhibitors Cough   Ampicillin     NOT sure if its ampicillin or amoxicillin---stomach cramps   Effexor [Venlafaxine] Other (See Comments)    PT DOES NOT REMEMBER REACTION    Wellbutrin [Bupropion] Other (See Comments)    PT DOES NOT REMEMBER REACTION    Prior to Admission medications   Medication Sig Start Date End Date Taking? Authorizing Provider  amLODipine (NORVASC) 10 MG tablet Take 10 mg by mouth daily.   Yes [provider]  aspirin EC 81 MG tablet Take 81 mg by  mouth daily. Swallow whole.   Yes [provider]  Cholecalciferol (VITAMIN D) 125 MCG (5000 UT) CAPS Take 5,000 Units by mouth daily.   Yes [provider]  citalopram (CELEXA) 40 MG tablet Take 40 mg by mouth daily. 12/15/21  Yes [provider]  Colloidal Oatmeal (GOLD BOND ECZEMA RELIEF) 2 % CREA Apply 1 application topically daily as needed (eczema).   Yes [provider]  furosemide (LASIX) 40 MG tablet Take 40 mg by mouth daily. 02/12/20  Yes [provider]  insulin glargine (LANTUS SOLOSTAR) 100 UNIT/ML Solostar Pen Inject 20 Units into the skin daily.   Yes [provider]  loratadine (CLARITIN) 10 MG tablet Take 10 mg by mouth daily as needed for allergies.   Yes [provider]  nebivolol (BYSTOLIC) 5 MG tablet Take 2 tablets (10 mg total) by mouth daily. Patient taking  differently: Take 5 mg by mouth daily. 06/11/21  Yes Patwardhan, Manish J, MD  polyvinyl alcohol (LIQUIFILM TEARS) 1.4 % ophthalmic solution Place 1 drop into both eyes 4 (four) times daily as needed for dry eyes.   Yes [provider]  potassium gluconate 595 (99 K) MG TABS tablet Take 595 mg by mouth daily.   Yes [provider]  rosuvastatin (CRESTOR) 20 MG tablet TAKE 1 TABLET BY MOUTH EVERY DAY 03/03/21  Yes Patwardhan, Manish J, MD  zolpidem (AMBIEN) 10 MG tablet Take 10 mg by mouth at bedtime as needed for sleep. 05/09/20  Yes [provider]  metFORMIN (GLUCOPHAGE) 500 MG tablet Take 1,000 mg by mouth 2 (two) times daily.    [provider]     All other systems have been reviewed and were otherwise negative with the exception of those mentioned in the HPI and as above.  Physical Exam: Vitals:   01/06/22 0713  BP: 139/81  Pulse: 64  Resp: 16  Temp: 98.2 F (36.8 C)  SpO2: 95%    Body mass index is 37.65 kg/m.  General: Alert, no acute distress Cardiovascular: No pedal edema Respiratory: No cyanosis, no use of accessory musculature Skin: No lesions in the area of chief complaint Neurologic: Sensation intact distally Psychiatric: Patient is competent for consent with normal mood and affect Lymphatic: No axillary or cervical lymphadenopathy   Assessment/Plan: 8 year history of progressive right greater than left leg pain due to severe stenosis and instabilty at L4-L5 and L5-S1 Plan for Procedure(s): RIGHT-SIDED LUMBAR 4 - LUMBAR 5, LUMBAR 5- SACRUM 1 TRANSFORAMINAL LUMBAR INTERBODY FUSION AND DECOMPRESSION WITH INSTRUMENTATION AND ALLOGRAFT   Jackelyn Hoehn, MD 01/06/2022 7:23 AM

## 2022-01-07 DIAGNOSIS — M4807 Spinal stenosis, lumbosacral region: Secondary | ICD-10-CM | POA: Diagnosis not present

## 2022-01-07 LAB — GLUCOSE, CAPILLARY: Glucose-Capillary: 185 mg/dL — ABNORMAL HIGH (ref 70–99)

## 2022-01-07 MED ORDER — OXYCODONE-ACETAMINOPHEN 5-325 MG PO TABS: 1.0000 | ORAL_TABLET | ORAL | 0 refills | Status: AC | PRN

## 2022-01-07 MED ORDER — TIZANIDINE HCL 2 MG PO TABS: 2.0000 mg | ORAL_TABLET | Freq: Four times a day (QID) | ORAL | 2 refills | Status: AC | PRN

## 2022-01-07 MED FILL — Thrombin (Recombinant) For Soln 20000 Unit: CUTANEOUS | Qty: 1 | Status: AC

## 2022-01-07 NOTE — Evaluation (Signed)
Occupational Therapy Evaluation Patient Details Name: Steven Contreras MRN: [id number] DOB: [birth year] Today's Date: 01/07/2022   History of Present Illness Pt is a 66 y/o male who presents s/p L4-S1 TLIF on 01/06/2022. PMH significant for ADD, DM, HTN, hyperthyroidism, R rotator cuff tear, L side rotator cuff, bicep and clavicle repair, R THR 2016.   Clinical Impression   PTA patient independent. Admitted for above and presents with problem list below.  Educated on back precautions, brace mgmt, ADL compensatory techniques, recommendations, safety and DME/AE.  He completes ADLs with up to min assist, mobility and transfers with modified independence.  Spouse can assist with ADLs as needed at dc.  Based on performance today no further OT needs identified and OT will sign off.      Recommendations for follow up therapy are one component of a multi-disciplinary discharge planning process, led by the attending physician.  Recommendations may be updated based on patient status, additional functional criteria and insurance authorization.   Follow Up Recommendations  No OT follow up    Assistance Recommended at Discharge PRN  Patient can return home with the following A little help with bathing/dressing/bathroom;Assist for transportation;Assistance with cooking/housework    Functional Status Assessment  Patient has had a recent decline in their functional status and demonstrates the ability to make significant improvements in function in a reasonable and predictable amount of time.  Equipment Recommendations  None recommended by OT    Recommendations for Other Services       Precautions / Restrictions Precautions Precautions: Fall;Back Precaution Booklet Issued: Yes (comment) Precaution Comments: Reviewed handout and pt was cued for precautions during functional mobility. Required Braces or Orthoses: Spinal Brace Spinal Brace: Thoracolumbosacral orthotic;Applied in sitting  position Restrictions Weight Bearing Restrictions: No      Mobility Bed Mobility Overal bed mobility: Modified Independent             General bed mobility comments: log roll technique with independence    Transfers Overall transfer level: Modified independent Equipment used: None               General transfer comment: Pt demonstrated good posture and proper hand placement on seated surface for safety.      Balance Overall balance assessment: No apparent balance deficits (not formally assessed)                                         ADL either performed or assessed with clinical judgement   ADL Overall ADL's : Needs assistance/impaired     Grooming: Modified independent;Standing       Lower Body Bathing: Minimal assistance;Sit to/from stand Lower Body Bathing Details (indicate cue type and reason): educated on use of long handled sponge for LB bathing, sitting Upper Body Dressing : Minimal assistance;Sitting Upper Body Dressing Details (indicate cue type and reason): brace mgmt spouse can aassist Lower Body Dressing: Minimal assistance;Sit to/from stand Lower Body Dressing Details (indicate cue type and reason): cueing for technique, min assist to don shoes; spouse can assist Toilet Transfer: Modified Independent;Ambulation     Toileting - Clothing Manipulation Details (indicate cue type and reason): reviewed compenstory techniques     Functional mobility during ADLs: Modified independent General ADL Comments: reviewed back precautions and compensatory techniuqes for AdLs     Vision   Vision Assessment?: No apparent visual deficits     Perception  Praxis      Pertinent Vitals/Pain Pain Assessment Pain Assessment: Faces Faces Pain Scale: Hurts a little bit Pain Location: Incision site Pain Descriptors / Indicators: Operative site guarding, Sore Pain Intervention(s): Limited activity within patient's tolerance, Monitored  during session, Repositioned     Hand Dominance     Extremity/Trunk Assessment Upper Extremity Assessment Upper Extremity Assessment: Overall WFL for tasks assessed   Lower Extremity Assessment Lower Extremity Assessment: Defer to PT evaluation   Cervical / Trunk Assessment Cervical / Trunk Assessment: Back Surgery   Communication Communication Communication: No difficulties   Cognition Arousal/Alertness: Awake/alert Behavior During Therapy: WFL for tasks assessed/performed Overall Cognitive Status: Within Functional Limits for tasks assessed                                       General Comments       Exercises     Shoulder Instructions      Home Living Family/patient expects to be discharged to:: Private residence Living Arrangements: Spouse/significant other;Children Available Help at Discharge: Family;Available 24 hours/day Type of Home: House Home Access: Stairs to enter [employer] of Steps: 2 Entrance Stairs-Rails: None Home Layout: Two level Alternate Level Stairs-Number of Steps: flight Alternate Level Stairs-Rails: Right Bathroom Shower/Tub: Producer, television/film/video: Standard     Home Equipment: Agricultural consultant (2 wheels);Cane - single point;Shower seat - built in          Prior Functioning/Environment Prior Level of Function : Independent/Modified Independent                        OT Problem List: Decreased activity tolerance;Decreased knowledge of use of DME or AE;Decreased knowledge of precautions;Pain      OT Treatment/Interventions:      OT Goals(Current goals can be found in the care plan section) Acute Rehab OT Goals Patient Stated Goal: home OT Goal Formulation: With patient  OT Frequency:      Co-evaluation              AM-PAC OT "6 Clicks" Daily Activity     Outcome Measure Help from another person eating meals?: None Help from another person taking care of personal grooming?:  None Help from another person toileting, which includes using toliet, bedpan, or urinal?: None Help from another person bathing (including washing, rinsing, drying)?: A Little Help from another person to put on and taking off regular upper body clothing?: None Help from another person to put on and taking off regular lower body clothing?: A Little 6 Click Score: 22   End of Session Equipment Utilized During Treatment: Back brace Nurse Communication: Mobility status  Activity Tolerance: Patient tolerated treatment well Patient left: with call bell/phone within reach;Other (comment) (sitting EOB)  OT Visit Diagnosis: Other abnormalities of gait and mobility (R26.89);Pain Pain - part of body:  (back)                Time: 5974-1638 OT Time Calculation (min): 22 min Charges:  OT General Charges $OT Visit: 1 Visit OT Evaluation $OT Eval Low Complexity: 1 Low  Barry Brunner, OT Acute Rehabilitation Services Office [phone]   Chancy Milroy 01/07/2022, 10:18 AM

## 2022-01-07 NOTE — Progress Notes (Signed)
Physical Therapy Evaluation and Discharge  Patient Details Name: Steven Contreras MRN: [id number] DOB: [birth year] Today's Date: 01/07/2022  History of Present Illness  Pt is a 66 y/o male who presents s/p L4-S1 TLIF on 01/06/2022. PMH significant for ADD, DM, HTN, hyperthyroidism, R rotator cuff tear, L side rotator cuff, bicep and clavicle repair, R THR 2016.   Clinical Impression  Patient evaluated by Physical Therapy with no further acute PT needs identified. All education has been completed and the patient has no further questions. Pt was able to demonstrate transfers and ambulation with gross modified independence and no AD. Pt was educated on precautions, brace application/wearing schedule, appropriate activity progression, and car transfer. Brace was adjusted for optimal fit. See below for any follow-up Physical Therapy or equipment needs. PT is signing off. Thank you for this referral.        Recommendations for follow up therapy are one component of a multi-disciplinary discharge planning process, led by the attending physician.  Recommendations may be updated based on patient status, additional functional criteria and insurance authorization.  Follow Up Recommendations No PT follow up      Assistance Recommended at Discharge PRN  Patient can return home with the following  Assist for transportation    Equipment Recommendations None recommended by PT  Recommendations for Other Services       Functional Status Assessment Patient has had a recent decline in their functional status and demonstrates the ability to make significant improvements in function in a reasonable and predictable amount of time.     Precautions / Restrictions Precautions Precautions: Fall;Back Precaution Booklet Issued: Yes (comment) Precaution Comments: Reviewed handout and pt was cued for precautions during functional mobility. Required Braces or Orthoses: Spinal Brace Spinal Brace: Thoracolumbosacral  orthotic;Applied in sitting position Restrictions Weight Bearing Restrictions: No      Mobility  Bed Mobility               General bed mobility comments: Pt was received sitting up in recliner. Verbally reviewed log roll technique.    Transfers Overall transfer level: Modified independent Equipment used: None               General transfer comment: Pt demonstrated good posture and proper hand placement on seated surface for safety.    Ambulation/Gait Ambulation/Gait assistance: Modified independent (Device/Increase time) Gait Distance (Feet): 560 Feet Assistive device: None Gait Pattern/deviations: Step-through pattern, Decreased stride length, Trunk flexed Gait velocity: Mildly decreased Gait velocity interpretation: 1.31 - 2.62 ft/sec, indicative of limited community ambulator   General Gait Details: VC's for optimal posture and forward gaze. No unsteadiness or LOB noted. Pt able to maintain upright posture throughout gait training.  Stairs Stairs: Yes Stairs assistance: Modified independent (Device/Increase time) Stair Management: One rail Right, Forwards, Alternating pattern Number of Stairs: 10 General stair comments: Pt demonstrating good alternating step pattern with the appearance of minimal effort. No assist required and no unsteadiness noted.  Wheelchair Mobility    Modified Rankin (Stroke Patients Only)       Balance Overall balance assessment: No apparent balance deficits (not formally assessed)                                           Pertinent Vitals/Pain Pain Assessment Pain Assessment: Faces Faces Pain Scale: Hurts a little bit Pain Location: Incision site Pain Descriptors / Indicators: Operative site  guarding, Sore Pain Intervention(s): Limited activity within patient's tolerance, Monitored during session, Repositioned    Home Living Family/patient expects to be discharged to:: Private residence Living  Arrangements: Spouse/significant other;Children Available Help at Discharge: Family;Available 24 hours/day Type of Home: House Home Access: Stairs to enter Entrance Stairs-Rails: None Entrance Stairs-Number of Steps: 2 Alternate Level Stairs-Number of Steps: flight Home Layout: Two level Home Equipment: Agricultural consultant (2 wheels);Cane - single point      Prior Function Prior Level of Function : Independent/Modified Independent                     Hand Dominance        Extremity/Trunk Assessment   Upper Extremity Assessment Upper Extremity Assessment: Defer to OT evaluation    Lower Extremity Assessment Lower Extremity Assessment: Generalized weakness (Mild; consistent with pre-op diagnosis)    Cervical / Trunk Assessment Cervical / Trunk Assessment: Back Surgery  Communication   Communication: No difficulties  Cognition Arousal/Alertness: Awake/alert Behavior During Therapy: WFL for tasks assessed/performed Overall Cognitive Status: Within Functional Limits for tasks assessed                                          General Comments      Exercises     Assessment/Plan    PT Assessment Patient does not need any further PT services  PT Problem List         PT Treatment Interventions      PT Goals (Current goals can be found in the Care Plan section)  Acute Rehab PT Goals Patient Stated Goal: Home today PT Goal Formulation: All assessment and education complete, DC therapy    Frequency       Co-evaluation               AM-PAC PT "6 Clicks" Mobility  Outcome Measure Help needed turning from your back to your side while in a flat bed without using bedrails?: None Help needed moving from lying on your back to sitting on the side of a flat bed without using bedrails?: None Help needed moving to and from a bed to a chair (including a wheelchair)?: None Help needed standing up from a chair using your arms (e.g., wheelchair or  bedside chair)?: None Help needed to walk in hospital room?: None Help needed climbing 3-5 steps with a railing? : None 6 Click Score: 24    End of Session Equipment Utilized During Treatment: Back brace Activity Tolerance: Patient tolerated treatment well Patient left: in bed;with call bell/phone within reach Nurse Communication: Mobility status PT Visit Diagnosis: Unsteadiness on feet (R26.81);Pain Pain - part of body:  (back)    Time: 7741-2878 PT Time Calculation (min) (ACUTE ONLY): 17 min   Charges:   PT Evaluation $PT Eval Low Complexity: 1 Low          Conni Slipper, PT, DPT Acute Rehabilitation Services Secure Chat Preferred Office: [phone]   Marylynn Pearson 01/07/2022, 10:11 AM

## 2022-01-07 NOTE — Progress Notes (Signed)
    Patient doing well  Patient denies leg pain and is very pleased about this Minimal back pain   Physical Exam: Vitals:   01/06/22 2307 01/07/22 0343  BP: 115/65 132/74  Pulse: 62 61  Resp: 20 20  Temp: 98.7 F (37.1 C) 98.1 F (36.7 C)  SpO2: 95% 97%   Patient appears very comfortable Dressing in place NVI  Drain output: 50cc/5 hours  POD #1 s/p lumbar decompression and fusion, doing well  - up with PT/OT, encourage ambulation - Percocet for pain, Robaxin for muscle spasms - d/c home today with f/u in 2 weeks  - drain will be pulled in 2 hours

## 2022-01-07 NOTE — Care Management Obs Status (Signed)
MEDICARE OBSERVATION STATUS NOTIFICATION   Patient Details  Name: Steven Contreras MRN: [id number] Date of Birth: 14-Nov-1955   Medicare Observation Status Notification Given:  Yes    Kermit Balo, RN 01/07/2022, 9:55 AM

## 2022-01-07 NOTE — Care Management CC44 (Signed)
Condition Code 44 Documentation Completed  Patient Details  Name: Steven Contreras MRN: [id number] Date of Birth: 02/20/1956   Condition Code 44 given:  Yes Patient signature on Condition Code 44 notice:  Yes Documentation of 2 MD's agreement:  Yes Code 44 added to claim:  Yes    Kermit Balo, RN 01/07/2022, 9:55 AM

## 2022-01-07 NOTE — Plan of Care (Signed)
Pt doing well. Pt given D/C instructions with verbal understanding. Rx's were sent to the pharmacy by MD. Pt's incision is clean and dry with no sign of infection. Pt's IV and JP drain were removed prior to D/C.  Pt D/C'd home via wheelchair per MD order. Pt is stable @ D/C and has no other needs at this time. Markiya Keefe, RN  

## 2022-01-08 ENCOUNTER — Encounter (HOSPITAL_COMMUNITY): Payer: Self-pay | Admitting: Orthopedic Surgery

## 2022-01-13 MED FILL — Heparin Sodium (Porcine) Inj 1000 Unit/ML: INTRAMUSCULAR | Qty: 60 | Status: AC

## 2022-01-13 MED FILL — Sodium Chloride IV Soln 0.9%: INTRAVENOUS | Qty: 2000 | Status: AC

## 2022-01-20 ENCOUNTER — Other Ambulatory Visit: Payer: Self-pay | Admitting: Cardiology

## 2022-01-20 DIAGNOSIS — E782 Mixed hyperlipidemia: Secondary | ICD-10-CM

## 2022-01-21 NOTE — Discharge Summary (Signed)
Patient ID: Steven Contreras MRN: [id number] DOB/AGE: [birth year] 66 y.o.  Admit date: 01/06/2022 Discharge date: 01/07/2022  Admission Diagnoses:  Principal Problem:   Radiculopathy   Discharge Diagnoses:  Same  Past Medical History:  Diagnosis Date   ADD (attention deficit disorder)    Anxiety    hx of   Arthritis    Diabetes ([place])    Eczema    Family history of adverse reaction to anesthesia    mother slow to awaken   Headache    High cholesterol    History of kidney stones    Hypertension    Hyperthyroidism    Right rotator cuff tear    Sleep apnea     Surgeries: Procedure(s): RIGHT-SIDED LUMBAR 4 - LUMBAR 5, LUMBAR 5- SACRUM 1 TRANSFORAMINAL LUMBAR INTERBODY FUSION AND DECOMPRESSION WITH INSTRUMENTATION AND ALLOGRAFT on 01/06/2022   Consultants: none  Discharged Condition: Improved  Hospital Course: [name] is an 66 y.o. male who was admitted 01/06/2022 for operative treatment of Radiculopathy. Patient has severe unremitting pain that affects sleep, daily activities, and work/hobbies. After pre-op clearance the patient was taken to the operating room on 01/06/2022 and underwent  Procedure(s): RIGHT-SIDED LUMBAR 4 - LUMBAR 5, LUMBAR 5- SACRUM 1 TRANSFORAMINAL LUMBAR INTERBODY FUSION AND DECOMPRESSION WITH INSTRUMENTATION AND ALLOGRAFT.    Patient was given perioperative antibiotics:  Anti-infectives (From admission, onward)    Start     Dose/Rate Route Frequency Ordered Stop   01/06/22 2300  vancomycin (VANCOREADY) IVPB 1250 mg/250 mL  Status:  Discontinued        1,250 mg 166.7 mL/hr over 90 Minutes Intravenous Every 12 hours 01/06/22 2013 01/07/22 1004   01/06/22 0745  vancomycin (VANCOREADY) IVPB 1500 mg/300 mL        1,500 mg 150 mL/hr over 120 Minutes Intravenous On call to O.R. 01/05/22 1414 01/06/22 1251        Patient was given sequential compression devices, early ambulation to prevent DVT.  Patient benefited maximally from hospital stay and  there were no complications.    Recent vital signs: BP 131/73   Pulse 73   Temp 98.1 F (36.7 C) (Oral)   Resp 17   Ht 5' 7.5" (1.715 m)   Wt 110.7 kg   SpO2 98%   BMI 37.65 kg/m    Discharge Medications:   Allergies as of 01/07/2022       Reactions   Ace Inhibitors Cough   Ampicillin    NOT sure if its ampicillin or amoxicillin---stomach cramps   Effexor [venlafaxine] Other (See Comments)   PT DOES NOT REMEMBER REACTION    Wellbutrin [bupropion] Other (See Comments)   PT DOES NOT REMEMBER REACTION         Medication List     TAKE these medications    amLODipine 10 MG tablet Commonly known as: NORVASC Take 10 mg by mouth daily.   aspirin EC 81 MG tablet Take 81 mg by mouth daily. Swallow whole.   citalopram 40 MG tablet Commonly known as: CELEXA Take 40 mg by mouth daily.   furosemide 40 MG tablet Commonly known as: LASIX Take 40 mg by mouth daily.   Gold Bond Eczema Relief 2 % Crea Generic drug: Colloidal Oatmeal Apply 1 application topically daily as needed (eczema).   Lantus SoloStar 100 UNIT/ML Solostar Pen Generic drug: insulin glargine Inject 20 Units into the skin daily.   loratadine 10 MG tablet Commonly known as: CLARITIN Take 10 mg by  mouth daily as needed for allergies.   metFORMIN 500 MG tablet Commonly known as: GLUCOPHAGE Take 1,000 mg by mouth 2 (two) times daily.   nebivolol 5 MG tablet Commonly known as: BYSTOLIC Take 2 tablets (10 mg total) by mouth daily. What changed: how much to take   oxyCODONE-acetaminophen 5-325 MG tablet Commonly known as: PERCOCET/ROXICET Take 1-2 tablets by mouth every 4 (four) hours as needed for severe pain.   polyvinyl alcohol 1.4 % ophthalmic solution Commonly known as: LIQUIFILM TEARS Place 1 drop into both eyes 4 (four) times daily as needed for dry eyes.   potassium gluconate 595 (99 K) MG Tabs tablet Take 595 mg by mouth daily.   tiZANidine 2 MG tablet Commonly known as: ZANAFLEX Take 1  tablet (2 mg total) by mouth every 6 (six) hours as needed for muscle spasms.   Vitamin D 125 MCG (5000 UT) Caps Take 5,000 Units by mouth daily.   zolpidem 10 MG tablet Commonly known as: AMBIEN Take 10 mg by mouth at bedtime as needed for sleep.        Diagnostic Studies: DG Lumbar Spine 2-3 Views  Result Date: 01/06/2022 CLINICAL DATA:  Lumbar fusion. EXAM: LUMBAR SPINE - 2-3 VIEW COMPARISON:  Intraoperative lumbar spine radiograph earlier today. Lumbar spine MRI 05/13/2021. FLUOROSCOPY: Radiation Exposure Index (as provided by the fluoroscopic device): 81.76 mGy Kerma FINDINGS: Seven intraoperative spot fluoroscopic images are provided. L4-S1 fusion has been performed with placement of bilateral pedicle screws, interconnecting rods, and interbody spacers. IMPRESSION: Intraoperative images during L4-S1 fusion. Electronically Signed   By: Steven Contreras M.D.   On: 01/06/2022 16:57   DG C-Arm 1-60 Min-No Report  Result Date: 01/06/2022 Fluoroscopy was utilized by the requesting physician.  No radiographic interpretation.   DG C-Arm 1-60 Min-No Report  Result Date: 01/06/2022 Fluoroscopy was utilized by the requesting physician.  No radiographic interpretation.   DG C-Arm 1-60 Min-No Report  Result Date: 01/06/2022 Fluoroscopy was utilized by the requesting physician.  No radiographic interpretation.   DG C-Arm 1-60 Min-No Report  Result Date: 01/06/2022 Fluoroscopy was utilized by the requesting physician.  No radiographic interpretation.   DG Lumbar Spine 1 View  Result Date: 01/06/2022 CLINICAL DATA:  Surgical localization imaging. EXAM: LUMBAR SPINE - 1 VIEW COMPARISON:  12/03/2020. FINDINGS: Single cross-table lateral view of the lumbar spine obtained portably demonstrates due to surgical needles, the more superior of which has its tip at the level of the inferior margin of the L3 spinous process, posterior to the mid body of the L4 vertebra. The lower has its tip projecting posterior  to the posterior margin of the L5-S1 disc. IMPRESSION: Portable localization imaging for lumbar spine surgery. Electronically Signed   By: Steven Contreras M.D.   On: 01/06/2022 14:14    Disposition: Discharge disposition: 01-Home or Self Care       POD #1 s/p lumbar decompression and fusion, doing well   - up with PT/OT, encourage ambulation - Percocet for pain, Robaxin for muscle spasms -Scripts for pain sent to pharmacy electronically  -D/C instructions sheet printed and in chart -D/C today  -F/U in office 2 weeks   Signed: Lennie Muckle Jakhiya Brower 01/21/2022, 1:17 PM

## 2022-01-28 ENCOUNTER — Encounter (HOSPITAL_COMMUNITY): Payer: Self-pay | Admitting: Orthopedic Surgery

## 2022-02-15 ENCOUNTER — Encounter (HOSPITAL_COMMUNITY): Payer: Self-pay | Admitting: Orthopedic Surgery

## 2022-05-31 ENCOUNTER — Telehealth: Payer: Self-pay | Admitting: *Deleted

## 2022-05-31 NOTE — Telephone Encounter (Signed)
Pt has video visit for CPAP tomorrow 1/30 at 130 pm. I called pt to inquire regarding his cpap usage as there is none in resmed since last august 2023. I asked pt to give [place] a call back. Will send him an ESS in mychart to complete in case he still plans to conduct visit tomorrow.

## 2022-05-31 NOTE — Progress Notes (Unsigned)
PATIENT: Steven Contreras DOB: 16-Mar-1956  REASON FOR VISIT: follow up HISTORY FROM: patient PRIMARY NEUROLOGIST:   Virtual Visit via Video Note  I connected with Steven Contreras on 06/01/22 at  1:30 PM EST by a video enabled telemedicine application located remotely at [facility] and verified that I am speaking with the correct person using two identifiers who was located at their own home.   I discussed the limitations of evaluation and management by telemedicine and the availability of in person appointments. The patient expressed understanding and agreed to proceed.   PATIENT: Steven Contreras DOB: 12/03/1955  REASON FOR VISIT: follow up HISTORY FROM: patient  Chief Complaint  Patient presents with   IN RESMED     HISTORY OF PRESENT ILLNESS: Today 06/01/22:   Steven Contreras is a 67 y.o. male with a history of OSA on CPAP. Returns today for follow-up.  He reports that he has not been using the CPAP since June.  States that he had back surgery and while he was recovering he did not use the machine.  He states that he has not been snoring and has been sleeping well without the CPAP.  He is questioning whether he has sleep apnea.  Also reports that proximately a 25 pound weight loss       REVIEW OF SYSTEMS: Out of a complete 14 system review of symptoms, the patient complains only of the following symptoms, and all other reviewed systems are negative.  ALLERGIES: Allergies  Allergen Reactions   Ace Inhibitors Cough   Ampicillin     NOT sure if its ampicillin or amoxicillin---stomach cramps   Effexor [Venlafaxine] Other (See Comments)    PT DOES NOT REMEMBER REACTION    Wellbutrin [Bupropion] Other (See Comments)    PT DOES NOT REMEMBER REACTION     HOME MEDICATIONS: Outpatient Medications Prior to Visit  Medication Sig Dispense Refill   amLODipine (NORVASC) 10 MG tablet Take 10 mg by mouth daily.     aspirin EC 81 MG tablet Take 81 mg by  mouth daily. Swallow whole.     Cholecalciferol (VITAMIN D) 125 MCG (5000 UT) CAPS Take 5,000 Units by mouth daily.     citalopram (CELEXA) 40 MG tablet Take 40 mg by mouth daily.     Colloidal Oatmeal (GOLD BOND ECZEMA RELIEF) 2 % CREA Apply 1 application topically daily as needed (eczema).     furosemide (LASIX) 40 MG tablet Take 40 mg by mouth daily.     insulin glargine (LANTUS SOLOSTAR) 100 UNIT/ML Solostar Pen Inject 20 Units into the skin daily.     loratadine (CLARITIN) 10 MG tablet Take 10 mg by mouth daily as needed for allergies.     metFORMIN (GLUCOPHAGE) 500 MG tablet Take 1,000 mg by mouth 2 (two) times daily.     nebivolol (BYSTOLIC) 5 MG tablet Take 2 tablets (10 mg total) by mouth daily. (Patient taking differently: Take 5 mg by mouth daily.) 90 tablet 3   oxyCODONE-acetaminophen (PERCOCET/ROXICET) 5-325 MG tablet Take 1-2 tablets by mouth every 4 (four) hours as needed for severe pain. 30 tablet 0   polyvinyl alcohol (LIQUIFILM TEARS) 1.4 % ophthalmic solution Place 1 drop into both eyes 4 (four) times daily as needed for dry eyes.     potassium gluconate 595 (99 K) MG TABS tablet Take 595 mg by mouth daily.     rosuvastatin (CRESTOR) 20 MG tablet TAKE 1 TABLET BY MOUTH EVERY  DAY 90 tablet 3   tiZANidine (ZANAFLEX) 2 MG tablet Take 1 tablet (2 mg total) by mouth every 6 (six) hours as needed for muscle spasms. 30 tablet 2   zolpidem (AMBIEN) 10 MG tablet Take 10 mg by mouth at bedtime as needed for sleep.     No facility-administered medications prior to visit.    PAST MEDICAL HISTORY: Past Medical History:  Diagnosis Date   ADD (attention deficit disorder)    Anxiety    hx of   Arthritis    Diabetes ([facility])    Eczema    Family history of adverse reaction to anesthesia    mother slow to awaken   Headache    High cholesterol    History of kidney stones    Hypertension    Hyperthyroidism    Right rotator cuff tear    Sleep apnea     PAST SURGICAL HISTORY: Past  Surgical History:  Procedure Laterality Date   APPENDECTOMY     when he was [age]   colonscopy  2014   IR URETERAL STENT LEFT NEW ACCESS W/O SEP NEPHROSTOMY CATH  12/29/2020   NEPHROLITHOTOMY Left 12/29/2020   Procedure: LEFT NEPHROLITHOTOMY PERCUTANEOUS;  Surgeon: Crista Elliot, MD;  Location: [facility];  Service: Urology;  Laterality: Left;   SHOULDER SURGERY Left years ago   rotator, bicep and clavicle repair.   SHOULDER SURGERY Right 2016   TOTAL HIP ARTHROPLASTY Right 10/08/2014   Procedure: RIGHT TOTAL HIP ARTHROPLASTY ANTERIOR APPROACH;  Surgeon: Durene Romans, MD;  Location: [facility];  Service: Orthopedics;  Laterality: Right;   TRANSFORAMINAL LUMBAR INTERBODY FUSION (TLIF) WITH PEDICLE SCREW FIXATION 2 LEVEL Right 01/06/2022   Procedure: RIGHT-SIDED LUMBAR 4 - LUMBAR 5, LUMBAR 5- SACRUM 1 TRANSFORAMINAL LUMBAR INTERBODY FUSION AND DECOMPRESSION WITH INSTRUMENTATION AND ALLOGRAFT;  Surgeon: Estill Bamberg, MD;  Location: [facility];  Service: Orthopedics;  Laterality: Right;    FAMILY HISTORY: Family History  Problem Relation Age of Onset   Pancreatic cancer Mother    Prostate cancer Father    Allergic rhinitis Sister     SOCIAL HISTORY: Social History   Socioeconomic History   Marital status: Married    Spouse name: Mary   Number of children: 3   Years of education: Not on file   Highest education level: Not on file  Occupational History   Not on file  Tobacco Use   Smoking status: Former    Packs/day: 0.50    Years: 15.00    Total pack years: 7.50    Types: Cigarettes    Quit date: 05/03/2006    Years since quitting: 16.0   Smokeless tobacco: Never  Vaping Use   Vaping Use: Former  Substance and Sexual Activity   Alcohol use: Yes    Comment: rarely   Drug use: Never   Sexual activity: Not Currently  Other Topics Concern   Not on file  Social History Narrative   Not on file   Social Determinants of Health   Financial Resource Strain: Not on file  Food  Insecurity: Not on file  Transportation Needs: Not on file  Physical Activity: Not on file  Stress: Not on file  Social Connections: Not on file  Intimate Partner Violence: Not on file      PHYSICAL EXAM Generalized: Well developed, in no acute distress   Neurological examination  Mentation: Alert oriented to time, place, history taking. Follows all commands speech and language fluent Cranial nerve II-XII:Extraocular movements were  full. Facial symmetry noted. uvula tongue midline. Head turning and shoulder shrug  were normal and symmetric. Motor: Good strength throughout subjectively per patient Sensory: Sensory testing is intact to soft touch on all 4 extremities subjectively per patient Coordination: Cerebellar testing reveals good finger-nose-finger  Gait and station: Patient is able to stand from a seated position. gait is normal.  Reflexes: UTA  DIAGNOSTIC DATA (LABS, IMAGING, TESTING) - I reviewed patient records, labs, notes, testing and imaging myself where available.  Lab Results  Component Value Date   WBC 9.0 12/29/2021   HGB 16.2 12/29/2021   HCT 48.9 12/29/2021   MCV 87.3 12/29/2021   PLT 246 12/29/2021      Component Value Date/Time   NA 137 01/06/2022 0802   NA 137 02/29/2020 0859   K 3.8 01/06/2022 0802   CL 105 01/06/2022 0802   CO2 23 01/06/2022 0802   GLUCOSE 140 (H) 01/06/2022 0802   BUN 10 01/06/2022 0802   BUN 12 02/29/2020 0859   CREATININE 0.88 01/06/2022 0802   CALCIUM 9.7 01/06/2022 0802   PROT 7.5 02/03/2021 0916   ALBUMIN 4.7 02/03/2021 0916   AST 32 02/03/2021 0916   ALT 41 02/03/2021 0916   ALKPHOS 61 02/03/2021 0916   BILITOT 0.7 02/03/2021 0916   GFRNONAA >60 01/06/2022 0802   GFRAA 88 02/29/2020 0859   No results found for: "CHOL", "HDL", "LDLCALC", "LDLDIRECT", "TRIG", "CHOLHDL" Lab Results  Component Value Date   HGBA1C 8.0 (H) 12/17/2020   No results found for: "VITAMINB12" Lab Results  Component Value Date   TSH 1.60  02/03/2021      ASSESSMENT AND PLAN 67 y.o. year old male  has a past medical history of ADD (attention deficit disorder), Anxiety, Arthritis, Diabetes (HCC), Eczema, Family history of adverse reaction to anesthesia, Headache, High cholesterol, History of kidney stones, Hypertension, Hyperthyroidism, Right rotator cuff tear, and Sleep apnea. here with:  OSA on CPAP  Currently not using his CPAP machine Has approximately 25 pound weight loss will repeat home sleep test to evaluate his sleep apnea Patient will follow-up pending sleep results   Butch Penny, MSN, NP-C 06/01/2022, 1:22 PM [facility] [address] [place], [place] [address] [phone]

## 2022-06-01 ENCOUNTER — Telehealth (INDEPENDENT_AMBULATORY_CARE_PROVIDER_SITE_OTHER): Payer: Medicare Other | Admitting: Adult Health

## 2022-06-01 DIAGNOSIS — G4733 Obstructive sleep apnea (adult) (pediatric): Secondary | ICD-10-CM | POA: Diagnosis not present

## 2022-06-11 ENCOUNTER — Ambulatory Visit: Payer: Medicare Other | Admitting: Cardiology

## 2022-06-11 ENCOUNTER — Encounter: Payer: Self-pay | Admitting: Cardiology

## 2022-06-11 VITALS — BP 135/81 | HR 66 | Ht 67.5 in | Wt 251.0 lb

## 2022-06-11 DIAGNOSIS — E782 Mixed hyperlipidemia: Secondary | ICD-10-CM

## 2022-06-11 DIAGNOSIS — I10 Essential (primary) hypertension: Secondary | ICD-10-CM

## 2022-06-11 DIAGNOSIS — I2510 Atherosclerotic heart disease of native coronary artery without angina pectoris: Secondary | ICD-10-CM

## 2022-06-11 DIAGNOSIS — R931 Abnormal findings on diagnostic imaging of heart and coronary circulation: Secondary | ICD-10-CM

## 2022-06-11 NOTE — Progress Notes (Signed)
Patient referred by Jonathon Jordan, MD for screening for CAD  Subjective:   Steven Contreras, male    DOB: 08-Apr-1956, 67 y.o.   MRN: [id number]   Chief Complaint  Patient presents with   Elevated coronary artery calcium score   Follow-up     HPI  67 y.o. 67 y.o. Caucasian male with hypertension, hyperlipidemia, type 2 DM, elevated calcium score, CAD without angina   Patient recently underwent spinal surgery 01/2022.  He has had remarkable improvement in his chronic back pain since then.  He is increasing his physical activity and exercising regularly without any symptoms of chest pain or shortness of breath.  However, his weight has plateaued and A1c has gone up.  He was recently started on [facility] by his PCP Dr. Cheron Schaumann.  Patient has no chest pain or shortness of breath symptoms.  He is able to bike 30-60 min on stationary bike without any symptoms. He is disappointed with his A1C increasing on recent bloodwork. He is hoping to undergo spine surgery by Dr. Katherina Right in a few weeks from now, but fears his elevated blood glucose may affect this.   Reviewed recent test results with the patient, details below.   He is currently on amlodipine 10 mg, nebivolol 5 mg, Crestor 5 mg.  He has had severe cough with lisinopril in the past.  Current Outpatient Medications:    amLODipine (NORVASC) 10 MG tablet, Take 10 mg by mouth daily., Disp: , Rfl:    aspirin EC 81 MG tablet, Take 81 mg by mouth daily. Swallow whole., Disp: , Rfl:    Cholecalciferol (VITAMIN D) 125 MCG (5000 UT) CAPS, Take 5,000 Units by mouth daily., Disp: , Rfl:    citalopram (CELEXA) 40 MG tablet, Take 40 mg by mouth daily., Disp: , Rfl:    Colloidal Oatmeal (GOLD BOND ECZEMA RELIEF) 2 % CREA, Apply 1 application topically daily as needed (eczema)., Disp: , Rfl:    furosemide (LASIX) 40 MG tablet, Take 40 mg by mouth daily., Disp: , Rfl:    insulin glargine (LANTUS SOLOSTAR) 100 UNIT/ML Solostar Pen, Inject 20 Units into  the skin daily., Disp: , Rfl:    loratadine (CLARITIN) 10 MG tablet, Take 10 mg by mouth daily as needed for allergies., Disp: , Rfl:    metFORMIN (GLUCOPHAGE) 500 MG tablet, Take 1,000 mg by mouth 2 (two) times daily., Disp: , Rfl:    nebivolol (BYSTOLIC) 5 MG tablet, Take 2 tablets (10 mg total) by mouth daily. (Patient taking differently: Take 5 mg by mouth daily.), Disp: 90 tablet, Rfl: 3   oxyCODONE-acetaminophen (PERCOCET/ROXICET) 5-325 MG tablet, Take 1-2 tablets by mouth every 4 (four) hours as needed for severe pain., Disp: 30 tablet, Rfl: 0   polyvinyl alcohol (LIQUIFILM TEARS) 1.4 % ophthalmic solution, Place 1 drop into both eyes 4 (four) times daily as needed for dry eyes., Disp: , Rfl:    potassium gluconate 595 (99 K) MG TABS tablet, Take 595 mg by mouth daily., Disp: , Rfl:    rosuvastatin (CRESTOR) 20 MG tablet, TAKE 1 TABLET BY MOUTH EVERY DAY, Disp: 90 tablet, Rfl: 3   tiZANidine (ZANAFLEX) 2 MG tablet, Take 1 tablet (2 mg total) by mouth every 6 (six) hours as needed for muscle spasms., Disp: 30 tablet, Rfl: 2   zolpidem (AMBIEN) 10 MG tablet, Take 10 mg by mouth at bedtime as needed for sleep., Disp: , Rfl:    Cardiovascular and other pertinent studies:  EKG 06/11/2022:  Sinus rhythm 68 bpm  Poor R-wave progression Otherwise normal EKG  Lexiscan (Walking with mod Bruce)Tetrofosmin Stress Test  03/05/2020: Nondiagnostic ECG stress, pharmacologic stresss. There is a  moderate defect in the lateral, inferior and apical regions, due to prominent gut uptake artifact possibly large hiatal hernia noted in the inferolateral wall. In the absence of wall motion abnormality and normal LVEF, ischemia is less likely. Overall LV systolic function is normal without regional wall motion abnormalities. Stress LV EF: 62%.  No previous exam available for comparison. Low risk. Clinical correlation recommended.  Echocardiogram 02/26/2020:  Left ventricle cavity is normal in size and wall  thickness. Normal global  wall motion. Normal LV systolic function with EF 65%. Normal diastolic  filling pattern.  No significant valvular abnormality.  normal right atrial pressure.   CT Cardiac scoring 02/28/2020: Total: 287 LM: 0 LAD: 99 LCx: 126 RCA: 62 Cardiac anatomy: Normal Visualized thorax: RLL linear atelectasis/scarring  Recent labs: 06/09/2022: Chol 185, TG 277, HDL 65, LDL 76 HbA1C 8.7%  03/07/2022: Glucose 156, BUN/Cr 15/0.88. EGFR 94. Na/K 138/4.1. Rest of the CMP normal HbA1C 7.1%   12/08/2021: HbA1C 8.8%  Review of Systems  Cardiovascular:  Negative for chest pain, dyspnea on exertion, leg swelling, palpitations and syncope.  Musculoskeletal:  Negative for joint pain.        Vitals:   06/11/22 0841  BP: (!) 153/90  Pulse: 70  SpO2: 97%     Body mass index is 38.73 kg/m. Filed Weights   06/11/22 0841  Weight: 251 lb (113.9 kg)     Objective:   Physical Exam Vitals and nursing note reviewed.  Constitutional:      General: He is not in acute distress. Neck:     Vascular: No JVD.  Cardiovascular:     Rate and Rhythm: Normal rate and regular rhythm.     Pulses:          Dorsalis pedis pulses are 2+ on the right side and 1+ on the left side.       Posterior tibial pulses are 1+ on the right side and 1+ on the left side.     Heart sounds: Normal heart sounds. No murmur heard. Pulmonary:     Effort: Pulmonary effort is normal.     Breath sounds: Normal breath sounds. No wheezing or rales.  Musculoskeletal:     Right lower leg: No edema.     Left lower leg: No edema.         Assessment & Recommendations:   67 y.o. Caucasian male with hypertension, hyperlipidemia, type 2 DM, elevated calcium score, CAD without angina   CAD without angina:  Nuclear stress test (03/2020) reported as "moderate defect in the lateral, inferior and apical regions, due to prominent gut uptake artifact possibly large hiatal hernia noted in the inferolateral  wall. In the absence of wall motion abnormality and normal LVEF, ischemia is less likely". However, elevated calcium score with in all three vessels suggest ischemia cannot be excluded. There is no left main calcification. He does not have any angina symptoms. Reasonable continue medical management for now.  In absence of bleeding, continue Aspirin. Continue rosuvastatin 20 mg, nebivolol 10 mg daily.   Mixed hyperlipidemia: Chol 185, TG 277, HDL 65, LDL 76 (06/2022) High triglyceride likely due to uncontrolled diabetes mellitus.  Continue resistant 10 mg daily, management for diabetes as per PCP.  Type 2 DM: Managed by PCP.  Hypertension: Generally well controlled.  Blood pressure is 124/74  mmHg during annual physical with PCP few days ago.  At home, patient states that his blood pressure has been "the best ever it has been".  I suspect today's elevated number could potentially be whitecoat hypertension. Blood pressure was better on second check. No change made today.  Continue regular monitoring of blood pressure at home.    F/u in 1 year     Nigel Mormon, MD Pager: [phone] Office: [phone]

## 2022-06-15 ENCOUNTER — Telehealth: Payer: Self-pay | Admitting: Adult Health

## 2022-06-15 NOTE — Telephone Encounter (Signed)
Medicare/aarp no auth req EE   left VM 06/02/22 [place]  2/12: lvm by mla

## 2022-07-22 ENCOUNTER — Encounter: Payer: Self-pay | Admitting: Family Medicine

## 2022-07-28 ENCOUNTER — Other Ambulatory Visit: Payer: Self-pay | Admitting: Family Medicine

## 2022-07-28 DIAGNOSIS — R42 Dizziness and giddiness: Secondary | ICD-10-CM

## 2022-08-04 ENCOUNTER — Ambulatory Visit: Payer: Medicare Other | Admitting: Cardiology

## 2022-08-28 ENCOUNTER — Ambulatory Visit
Admission: RE | Admit: 2022-08-28 | Discharge: 2022-08-28 | Disposition: A | Discharge status: Home / Self Care | Payer: Medicare Other | Source: Ambulatory Visit | Attending: Family Medicine | Admitting: Family Medicine

## 2022-08-28 DIAGNOSIS — R42 Dizziness and giddiness: Secondary | ICD-10-CM

## 2022-08-28 MED ORDER — GADOPICLENOL 0.5 MMOL/ML IV SOLN
10.0000 mL | Freq: Once | INTRAVENOUS | Status: AC | PRN
  Administered 2022-08-28: 10 mL via INTRAVENOUS

## 2022-09-16 NOTE — Progress Notes (Signed)
Labs 09/07/2022: Glucose 116, BUN/Cr 18/0.9. EGFR 93. Na/K 136/4.4. Rest of the CMP normal HbA1C 6.4% Chol 220, TG 163, HDL 55, LDL 135  Reviewed the labs above. Cholesterol is uncontrolled. Please check if the patient is taking rosuvastatin 20 mg or not.  Thanks MJP

## 2022-09-16 NOTE — Progress Notes (Signed)
LMTCB

## 2022-09-23 NOTE — Progress Notes (Signed)
Called patient to inform him about his lab results. Patient mention he did not his rosuvastatin 3 week before his lab test, due to no more refills.

## 2022-09-24 ENCOUNTER — Other Ambulatory Visit: Payer: Self-pay

## 2022-09-24 DIAGNOSIS — E782 Mixed hyperlipidemia: Secondary | ICD-10-CM

## 2022-09-24 MED ORDER — ROSUVASTATIN CALCIUM 20 MG PO TABS: 20.0000 mg | ORAL_TABLET | Freq: Every day | ORAL | 3 refills | Status: DC

## 2022-09-24 NOTE — Progress Notes (Signed)
Please send refills 90 pillsX3

## 2022-11-29 NOTE — Progress Notes (Deleted)
[name] [address] [place] [address] Phone: [phone] Subjective:    I'm seeing this patient by the request  of:  [name], MD  CC:   [id number]  11/11/2021 Patient does have some lumbar radiculopathy. Does have a positive straight leg test with radicular symptoms. Patient is unable to walk greater than 200 feet without any significant discomfort and pain as well. Patient is already on gabapentin 300 mg daily. Discussed other medication changes including citalopram to Cymbalta. At this point though the patient's quality of life seems to be fairly significantly deteriorated at this time. Discussed with patient about with patient worsening symptoms to 3 [facility] evaluation with surgeon to see if this changes any type of opinion. I do feel that he is worsening over the course of time which is concerning at this moment.   Updated 12/02/2022 Steven Contreras is a 67 y.o. male coming in with complaint of L hand pain       Past Medical History:  Diagnosis Date   ADD (attention deficit disorder)    Anxiety    hx of   Arthritis    Diabetes (HCC)    Eczema    Family history of adverse reaction to anesthesia    mother slow to awaken   Headache    High cholesterol    History of kidney stones    Hypertension    Hyperthyroidism    Right rotator cuff tear    Sleep apnea    Past Surgical History:  Procedure Laterality Date   APPENDECTOMY     when he was [age]   colonscopy  2014   IR URETERAL STENT LEFT NEW ACCESS W/O SEP NEPHROSTOMY CATH  12/29/2020   NEPHROLITHOTOMY Left 12/29/2020   Procedure: LEFT NEPHROLITHOTOMY PERCUTANEOUS;  Surgeon: Crista Elliot, MD;  Location: [facility];  Service: Urology;  Laterality: Left;   SHOULDER SURGERY Left years ago   rotator, bicep and clavicle repair.   SHOULDER SURGERY Right 2016   TOTAL HIP ARTHROPLASTY Right 10/08/2014   Procedure: RIGHT TOTAL HIP ARTHROPLASTY ANTERIOR APPROACH;  Surgeon:  Durene Romans, MD;  Location: [facility];  Service: Orthopedics;  Laterality: Right;   TRANSFORAMINAL LUMBAR INTERBODY FUSION (TLIF) WITH PEDICLE SCREW FIXATION 2 LEVEL Right 01/06/2022   Procedure: RIGHT-SIDED LUMBAR 4 - LUMBAR 5, LUMBAR 5- SACRUM 1 TRANSFORAMINAL LUMBAR INTERBODY FUSION AND DECOMPRESSION WITH INSTRUMENTATION AND ALLOGRAFT;  Surgeon: Estill Bamberg, MD;  Location: [facility];  Service: Orthopedics;  Laterality: Right;   Social History   Socioeconomic History   Marital status: Married    Spouse name: Mary   Number of children: 3   Years of education: Not on file   Highest education level: Not on file  Occupational History   Not on file  Tobacco Use   Smoking status: Former    Current packs/day: 0.00    Average packs/day: 0.5 packs/day for 15.0 years (7.5 ttl pk-yrs)    Types: Cigarettes    Start date: 05/04/1991    Quit date: 05/03/2006    Years since quitting: 16.5   Smokeless tobacco: Never  Vaping Use   Vaping status: Former  Substance and Sexual Activity   Alcohol use: Yes    Comment: rarely   Drug use: Never   Sexual activity: Not Currently  Other Topics Concern   Not on file  Social History Narrative   Not on file   Social Determinants of Health   Financial Resource Strain: Not  on file  Food Insecurity: Not on file  Transportation Needs: Not on file  Physical Activity: Not on file  Stress: Not on file  Social Connections: Unknown (09/15/2021)   Received from [facility]   Social Network    Social Network: Not on file   Allergies  Allergen Reactions   Ace Inhibitors Cough   Ampicillin     NOT sure if its ampicillin or amoxicillin---stomach cramps   Effexor [Venlafaxine] Other (See Comments)    PT DOES NOT REMEMBER REACTION    Semaglutide Other (See Comments)   Wellbutrin [Bupropion] Other (See Comments)    PT DOES NOT REMEMBER REACTION    Family History  Problem Relation Age of Onset   Pancreatic cancer Mother    Prostate cancer Father    Allergic  rhinitis Sister     Current Outpatient Medications (Endocrine & Metabolic):    insulin glargine (LANTUS SOLOSTAR) 100 UNIT/ML Solostar Pen, Inject 40 Units into the skin daily.   metFORMIN (GLUCOPHAGE) 500 MG tablet, Take 1,000 mg by mouth 2 (two) times daily.   MOUNJARO 2.5 MG/0.5ML Pen, Inject into the skin.  Current Outpatient Medications (Cardiovascular):    amLODipine (NORVASC) 10 MG tablet, Take 10 mg by mouth daily.   furosemide (LASIX) 40 MG tablet, Take 40 mg by mouth daily.   nebivolol (BYSTOLIC) 5 MG tablet, Take 2 tablets (10 mg total) by mouth daily. (Patient taking differently: Take 5 mg by mouth daily.)   rosuvastatin (CRESTOR) 20 MG tablet, Take 1 tablet (20 mg total) by mouth daily.  Current Outpatient Medications (Respiratory):    loratadine (CLARITIN) 10 MG tablet, Take 10 mg by mouth daily as needed for allergies.  Current Outpatient Medications (Analgesics):    aspirin EC 81 MG tablet, Take 81 mg by mouth daily. Swallow whole.   oxyCODONE-acetaminophen (PERCOCET/ROXICET) 5-325 MG tablet, Take 1-2 tablets by mouth every 4 (four) hours as needed for severe pain.   Current Outpatient Medications (Other):    Cholecalciferol (VITAMIN D) 125 MCG (5000 UT) CAPS, Take 5,000 Units by mouth daily.   citalopram (CELEXA) 40 MG tablet, Take 40 mg by mouth daily.   Colloidal Oatmeal (GOLD BOND ECZEMA RELIEF) 2 % CREA, Apply 1 application topically daily as needed (eczema).   Magnesium Gluconate 250 MG TABS, 1 tablet Orally Once a day   polyvinyl alcohol (LIQUIFILM TEARS) 1.4 % ophthalmic solution, Place 1 drop into both eyes 4 (four) times daily as needed for dry eyes.   potassium gluconate 595 (99 K) MG TABS tablet, Take 595 mg by mouth daily.   tiZANidine (ZANAFLEX) 2 MG tablet, Take 1 tablet (2 mg total) by mouth every 6 (six) hours as needed for muscle spasms.   zolpidem (AMBIEN) 10 MG tablet, Take 10 mg by mouth at bedtime as needed for sleep.   Reviewed prior external  information including notes and imaging from  primary care provider As well as notes that were available from care everywhere and other healthcare systems.  Past medical history, social, surgical and family history all reviewed in electronic medical record.  No pertanent information unless stated regarding to the chief complaint.   Review of Systems:  No headache, visual changes, nausea, vomiting, diarrhea, constipation, dizziness, abdominal pain, skin rash, fevers, chills, night sweats, weight loss, swollen lymph nodes, body aches, joint swelling, chest pain, shortness of breath, mood changes. POSITIVE muscle aches  Objective  There were no vitals taken for this visit.   General: No apparent distress alert and  oriented x3 mood and affect normal, dressed appropriately.  HEENT: Pupils equal, extraocular movements intact  Respiratory: Patient's speak in full sentences and does not appear short of breath  Cardiovascular: No lower extremity edema, non tender, no erythema      Impression and Recommendations:

## 2022-12-02 ENCOUNTER — Ambulatory Visit: Payer: Medicare Other | Admitting: Family Medicine

## 2022-12-08 ENCOUNTER — Ambulatory Visit: Payer: Medicare Other | Admitting: Family Medicine

## 2022-12-15 NOTE — Progress Notes (Unsigned)
[name] [address] [place] [address] Phone: [phone] Subjective:   Steven Contreras, am serving as a scribe for Dr. Antoine Primas.  I'm seeing this patient by the request  of:  [name], MD  CC: Bilateral hand tingling and left finger tightening  [id number]  11/11/2021 Patient does have some lumbar radiculopathy. Does have a positive straight leg test with radicular symptoms. Patient is unable to walk greater than 200 feet without any significant discomfort and pain as well. Patient is already on gabapentin 300 mg daily. Discussed other medication changes including citalopram to Cymbalta. At this point though the patient's quality of life seems to be fairly significantly deteriorated at this time. Discussed with patient about with patient worsening symptoms to 3 [facility] evaluation with surgeon to see if this changes any type of opinion. I do feel that he is worsening over the course of time which is concerning at this moment.   Updated 12/16/2022 Steven Contreras is a 67 y.o. male coming in with complaint of L hand pain. Patient states that he is trying to get to 200#. Has very little back pain and some burning in glute meds.   C/o B hand pain. Has pain in forearms and hands. Hands are numb at times and he will feel electrical impulse.  Patient states that seems to be worsening frequency  Trigger finger L ring finger. Tries to massage hand.  Affecting daily activities.  Unable to straighten it without help from the other hand       Past Medical History:  Diagnosis Date   ADD (attention deficit disorder)    Anxiety    hx of   Arthritis    Diabetes (HCC)    Eczema    Family history of adverse reaction to anesthesia    mother slow to awaken   Headache    High cholesterol    History of kidney stones    Hypertension    Hyperthyroidism    Right rotator cuff tear    Sleep apnea    Past Surgical History:  Procedure Laterality  Date   APPENDECTOMY     when he was [age]   colonscopy  2014   IR URETERAL STENT LEFT NEW ACCESS W/O SEP NEPHROSTOMY CATH  12/29/2020   NEPHROLITHOTOMY Left 12/29/2020   Procedure: LEFT NEPHROLITHOTOMY PERCUTANEOUS;  Surgeon: Crista Elliot, MD;  Location: [facility];  Service: Urology;  Laterality: Left;   SHOULDER SURGERY Left years ago   rotator, bicep and clavicle repair.   SHOULDER SURGERY Right 2016   TOTAL HIP ARTHROPLASTY Right 10/08/2014   Procedure: RIGHT TOTAL HIP ARTHROPLASTY ANTERIOR APPROACH;  Surgeon: Durene Romans, MD;  Location: [facility];  Service: Orthopedics;  Laterality: Right;   TRANSFORAMINAL LUMBAR INTERBODY FUSION (TLIF) WITH PEDICLE SCREW FIXATION 2 LEVEL Right 01/06/2022   Procedure: RIGHT-SIDED LUMBAR 4 - LUMBAR 5, LUMBAR 5- SACRUM 1 TRANSFORAMINAL LUMBAR INTERBODY FUSION AND DECOMPRESSION WITH INSTRUMENTATION AND ALLOGRAFT;  Surgeon: Estill Bamberg, MD;  Location: [facility];  Service: Orthopedics;  Laterality: Right;   Social History   Socioeconomic History   Marital status: Married    Spouse name: Mary   Number of children: 3   Years of education: Not on file   Highest education level: Not on file  Occupational History   Not on file  Tobacco Use   Smoking status: Former    Current packs/day: 0.00    Average packs/day: 0.5 packs/day for 15.0 years (  7.5 ttl pk-yrs)    Types: Cigarettes    Start date: 05/04/1991    Quit date: 05/03/2006    Years since quitting: 16.6   Smokeless tobacco: Never  Vaping Use   Vaping status: Former  Substance and Sexual Activity   Alcohol use: Yes    Comment: rarely   Drug use: Never   Sexual activity: Not Currently  Other Topics Concern   Not on file  Social History Narrative   Not on file   Social Determinants of Health   Financial Resource Strain: Not on file  Food Insecurity: Not on file  Transportation Needs: Not on file  Physical Activity: Not on file  Stress: Not on file  Social Connections: Unknown (09/15/2021)    Received from [facility]   Social Network    Social Network: Not on file   Allergies  Allergen Reactions   Ace Inhibitors Cough   Ampicillin     NOT sure if its ampicillin or amoxicillin---stomach cramps   Effexor [Venlafaxine] Other (See Comments)    PT DOES NOT REMEMBER REACTION    Semaglutide Other (See Comments)   Wellbutrin [Bupropion] Other (See Comments)    PT DOES NOT REMEMBER REACTION    Family History  Problem Relation Age of Onset   Pancreatic cancer Mother    Prostate cancer Father    Allergic rhinitis Sister     Current Outpatient Medications (Endocrine & Metabolic):    insulin glargine (LANTUS SOLOSTAR) 100 UNIT/ML Solostar Pen, Inject 40 Units into the skin daily.   metFORMIN (GLUCOPHAGE) 500 MG tablet, Take 1,000 mg by mouth 2 (two) times daily.   MOUNJARO 2.5 MG/0.5ML Pen, Inject into the skin.  Current Outpatient Medications (Cardiovascular):    amLODipine (NORVASC) 10 MG tablet, Take 10 mg by mouth daily.   furosemide (LASIX) 40 MG tablet, Take 40 mg by mouth daily.   nebivolol (BYSTOLIC) 5 MG tablet, Take 2 tablets (10 mg total) by mouth daily. (Patient taking differently: Take 5 mg by mouth daily.)   rosuvastatin (CRESTOR) 20 MG tablet, Take 1 tablet (20 mg total) by mouth daily.  Current Outpatient Medications (Respiratory):    loratadine (CLARITIN) 10 MG tablet, Take 10 mg by mouth daily as needed for allergies.  Current Outpatient Medications (Analgesics):    aspirin EC 81 MG tablet, Take 81 mg by mouth daily. Swallow whole.   oxyCODONE-acetaminophen (PERCOCET/ROXICET) 5-325 MG tablet, Take 1-2 tablets by mouth every 4 (four) hours as needed for severe pain.   Current Outpatient Medications (Other):    Cholecalciferol (VITAMIN D) 125 MCG (5000 UT) CAPS, Take 5,000 Units by mouth daily.   citalopram (CELEXA) 40 MG tablet, Take 40 mg by mouth daily.   Colloidal Oatmeal (GOLD BOND ECZEMA RELIEF) 2 % CREA, Apply 1 application topically daily as  needed (eczema).   Magnesium Gluconate 250 MG TABS, 1 tablet Orally Once a day   polyvinyl alcohol (LIQUIFILM TEARS) 1.4 % ophthalmic solution, Place 1 drop into both eyes 4 (four) times daily as needed for dry eyes.   potassium gluconate 595 (99 K) MG TABS tablet, Take 595 mg by mouth daily.   tiZANidine (ZANAFLEX) 2 MG tablet, Take 1 tablet (2 mg total) by mouth every 6 (six) hours as needed for muscle spasms.   zolpidem (AMBIEN) 10 MG tablet, Take 10 mg by mouth at bedtime as needed for sleep.   Reviewed prior external information including notes and imaging from  primary care provider As well as notes that  were available from care everywhere and other healthcare systems.  Past medical history, social, surgical and family history all reviewed in electronic medical record.  No pertanent information unless stated regarding to the chief complaint.   Review of Systems:  No headache, visual changes, nausea, vomiting, diarrhea, constipation, dizziness, abdominal pain, skin rash, fevers, chills, night sweats, weight loss, swollen lymph nodes, body aches, joint swelling, chest pain, shortness of breath, mood changes. POSITIVE muscle aches  Objective  Blood pressure 126/82, pulse 96, height 5' 7.5" (1.715 m), weight 239 lb (108.4 kg), SpO2 98%.   General: No apparent distress alert and oriented x3 mood and affect normal, dressed appropriately.  HEENT: Pupils equal, extraocular movements intact  Respiratory: Patient's speak in full sentences and does not appear short of breath  Cardiovascular: No lower extremity edema, non tender, no erythema  Patient does have some loss of lordosis noted.  Some tenderness to palpation in the paraspinal musculature. Patient's hand exam shows a positive Tinel sign bilaterally left greater than right.  Does have some thenar eminence wasting on the .  Left side.  Patient also has what appears to be a trigger nodule noted at the A2 pulley.  Trigger finger ring is  noted.  Procedure: Real-time Ultrasound Guided Injection of left carpal tunnel Device: GE Logiq Q7 Ultrasound guided injection is preferred based studies that show increased duration, increased effect, greater accuracy, decreased procedural pain, increased response rate with ultrasound guided versus blind injection.  Verbal informed consent obtained.  Time-out conducted.  Noted no overlying erythema, induration, or other signs of local infection.  Skin prepped in a sterile fashion.  Local anesthesia: Topical Ethyl chloride.  With sterile technique and under real time ultrasound guidance:  median nerve visualized.  23g 5/8 inch needle inserted distal to proximal approach into nerve sheath. Pictures taken nfor needle placement. Patient did have injection of 0.5 cc of 0.5% Marcaine, and 0.5 cc of Kenalog 40 mg/dL. Completed without difficulty  Pain immediately resolved suggesting accurate placement of the medication.  Advised to call if fevers/chills, erythema, induration, drainage, or persistent bleeding.  Impression: Technically successful ultrasound guided injection.  Procedure: Real-time Ultrasound Guided Injection of left ring flexor tendon sheath Device: GE Logiq Q7 Ultrasound guided injection is preferred based studies that show increased duration, increased effect, greater accuracy, decreased procedural pain, increased response rate, and decreased cost with ultrasound guided versus blind injection.  Verbal informed consent obtained.  Time-out conducted.  Noted no overlying erythema, induration, or other signs of local infection.  Skin prepped in a sterile fashion.  Local anesthesia: Topical Ethyl chloride.  With sterile technique and under real time ultrasound guidance: With a 25-gauge half inch needle injected with 0.5 cc of 0.5% Marcaine and 0.5 cc of Kenalog 40 mg/mL Completed without difficulty  Pain immediately resolved suggesting accurate placement of the medication.  Advised to  call if fevers/chills, erythema, induration, drainage, or persistent bleeding.  Impression: Technically successful ultrasound guided injection.    Impression and Recommendations:     The above documentation has been reviewed and is accurate and complete Judi Saa, DO

## 2022-12-16 ENCOUNTER — Encounter: Payer: Self-pay | Admitting: Family Medicine

## 2022-12-16 ENCOUNTER — Ambulatory Visit (INDEPENDENT_AMBULATORY_CARE_PROVIDER_SITE_OTHER): Payer: Medicare Other | Admitting: Family Medicine

## 2022-12-16 ENCOUNTER — Other Ambulatory Visit: Payer: Self-pay

## 2022-12-16 VITALS — BP 126/82 | HR 96 | Ht 67.5 in | Wt 239.0 lb

## 2022-12-16 DIAGNOSIS — M79642 Pain in left hand: Secondary | ICD-10-CM

## 2022-12-16 DIAGNOSIS — G5603 Carpal tunnel syndrome, bilateral upper limbs: Secondary | ICD-10-CM | POA: Insufficient documentation

## 2022-12-16 DIAGNOSIS — G5602 Carpal tunnel syndrome, left upper limb: Secondary | ICD-10-CM | POA: Diagnosis not present

## 2022-12-16 DIAGNOSIS — M65342 Trigger finger, left ring finger: Secondary | ICD-10-CM | POA: Diagnosis not present

## 2022-12-16 NOTE — Assessment & Plan Note (Signed)
Patient given injection.  Could do bracing at night if needed.  Expect patient to respond well to this.  Follow-up again in 6 to 8 weeks

## 2022-12-16 NOTE — Assessment & Plan Note (Addendum)
Patient given injection today and tolerated the procedure well.  Could consider potential laboratory workup otherwise but do not think it will be necessary.  Could consider the possibility of different medications but he is trying to get off the medications and will be discussing with his primary care provider with him stopping some medications while he was on vacation.  Patient was given a brace for the right wrist to wear at night. Home exercises encouraged.  Follow-up again in 6 weeks and we will consider injections on the contralateral side if needed.

## 2022-12-16 NOTE — Patient Instructions (Addendum)
Injected L wrist and L finger Brace day and night for 2 weeks then at night for 2 weeks See me in 8 weeks

## 2023-02-07 NOTE — Progress Notes (Unsigned)
[facility] [address] [place] [address] Phone: [phone] Subjective:   Steven Contreras, am serving as a scribe for Dr. Antoine Primas.  I'm seeing this patient by the request  of:  [name], MD  CC: Bilateral wrists, back.  BJY:[id number]  12/16/2022 Patient given injection.  Could do bracing at night if needed.  Expect patient to respond well to this.  Follow-up again in 6 to 8 weeks      Patient given injection today and tolerated the procedure well.  Could consider potential laboratory workup otherwise but do not think it will be necessary.  Could consider the possibility of different medications but he is trying to get off the medications and will be discussing with his primary care provider with him stopping some medications while he was on vacation.  Patient was given a brace for the right wrist to wear at night. Home exercises encouraged.  Follow-up again in 6 weeks and we will consider injections on the contralateral side if needed.      Update 02/08/2023 Steven Contreras is a 67 y.o. male coming in with complaint of B carpal tunnel and L ring finger pain. Patient states wrist are doing pretty good. Noting else specific.       Past Medical History:  Diagnosis Date   ADD (attention deficit disorder)    Anxiety    hx of   Arthritis    Diabetes (HCC)    Eczema    Family history of adverse reaction to anesthesia    mother slow to awaken   Headache    High cholesterol    History of kidney stones    Hypertension    Hyperthyroidism    Right rotator cuff tear    Sleep apnea    Past Surgical History:  Procedure Laterality Date   APPENDECTOMY     when he was [age]   colonscopy  2014   IR URETERAL STENT LEFT NEW ACCESS W/O SEP NEPHROSTOMY CATH  12/29/2020   NEPHROLITHOTOMY Left 12/29/2020   Procedure: LEFT NEPHROLITHOTOMY PERCUTANEOUS;  Surgeon: Crista Elliot, MD;  Location: [facility];  Service: Urology;  Laterality:  Left;   SHOULDER SURGERY Left years ago   rotator, bicep and clavicle repair.   SHOULDER SURGERY Right 2016   TOTAL HIP ARTHROPLASTY Right 10/08/2014   Procedure: RIGHT TOTAL HIP ARTHROPLASTY ANTERIOR APPROACH;  Surgeon: Durene Romans, MD;  Location: [facility];  Service: Orthopedics;  Laterality: Right;   TRANSFORAMINAL LUMBAR INTERBODY FUSION (TLIF) WITH PEDICLE SCREW FIXATION 2 LEVEL Right 01/06/2022   Procedure: RIGHT-SIDED LUMBAR 4 - LUMBAR 5, LUMBAR 5- SACRUM 1 TRANSFORAMINAL LUMBAR INTERBODY FUSION AND DECOMPRESSION WITH INSTRUMENTATION AND ALLOGRAFT;  Surgeon: Estill Bamberg, MD;  Location: [facility];  Service: Orthopedics;  Laterality: Right;   Social History   Socioeconomic History   Marital status: Married    Spouse name: Mary   Number of children: 3   Years of education: Not on file   Highest education level: Not on file  Occupational History   Not on file  Tobacco Use   Smoking status: Former    Current packs/day: 0.00    Average packs/day: 0.5 packs/day for 15.0 years (7.5 ttl pk-yrs)    Types: Cigarettes    Start date: 05/04/1991    Quit date: 05/03/2006    Years since quitting: 16.7   Smokeless tobacco: Never  Vaping Use   Vaping status: Former  Substance and Sexual Activity  Alcohol use: Yes    Comment: rarely   Drug use: Never   Sexual activity: Not Currently  Other Topics Concern   Not on file  Social History Narrative   Not on file   Social Determinants of Health   Financial Resource Strain: Not on file  Food Insecurity: Not on file  Transportation Needs: Not on file  Physical Activity: Not on file  Stress: Not on file  Social Connections: Unknown (09/15/2021)   Received from [facility], [facility]   Social Network    Social Network: Not on file   Allergies  Allergen Reactions   Ace Inhibitors Cough   Ampicillin     NOT sure if its ampicillin or amoxicillin---stomach cramps   Effexor [Venlafaxine] Other (See Comments)    PT DOES NOT REMEMBER  REACTION    Semaglutide Other (See Comments)   Wellbutrin [Bupropion] Other (See Comments)    PT DOES NOT REMEMBER REACTION    Family History  Problem Relation Age of Onset   Pancreatic cancer Mother    Prostate cancer Father    Allergic rhinitis Sister     Current Outpatient Medications (Endocrine & Metabolic):    insulin glargine (LANTUS SOLOSTAR) 100 UNIT/ML Solostar Pen, Inject 40 Units into the skin daily.   metFORMIN (GLUCOPHAGE) 500 MG tablet, Take 1,000 mg by mouth 2 (two) times daily.   MOUNJARO 2.5 MG/0.5ML Pen, Inject into the skin.  Current Outpatient Medications (Cardiovascular):    amLODipine (NORVASC) 10 MG tablet, Take 10 mg by mouth daily.   furosemide (LASIX) 40 MG tablet, Take 40 mg by mouth daily.   nebivolol (BYSTOLIC) 5 MG tablet, Take 2 tablets (10 mg total) by mouth daily. (Patient taking differently: Take 5 mg by mouth daily.)   rosuvastatin (CRESTOR) 20 MG tablet, Take 1 tablet (20 mg total) by mouth daily.  Current Outpatient Medications (Respiratory):    loratadine (CLARITIN) 10 MG tablet, Take 10 mg by mouth daily as needed for allergies.  Current Outpatient Medications (Analgesics):    aspirin EC 81 MG tablet, Take 81 mg by mouth daily. Swallow whole.   oxyCODONE-acetaminophen (PERCOCET/ROXICET) 5-325 MG tablet, Take 1-2 tablets by mouth every 4 (four) hours as needed for severe pain.   Current Outpatient Medications (Other):    Cholecalciferol (VITAMIN D) 125 MCG (5000 UT) CAPS, Take 5,000 Units by mouth daily.   citalopram (CELEXA) 40 MG tablet, Take 40 mg by mouth daily.   Colloidal Oatmeal (GOLD BOND ECZEMA RELIEF) 2 % CREA, Apply 1 application topically daily as needed (eczema).   Magnesium Gluconate 250 MG TABS, 1 tablet Orally Once a day   polyvinyl alcohol (LIQUIFILM TEARS) 1.4 % ophthalmic solution, Place 1 drop into both eyes 4 (four) times daily as needed for dry eyes.   potassium gluconate 595 (99 K) MG TABS tablet, Take 595 mg by mouth  daily.   tiZANidine (ZANAFLEX) 2 MG tablet, Take 1 tablet (2 mg total) by mouth every 6 (six) hours as needed for muscle spasms.   zolpidem (AMBIEN) 10 MG tablet, Take 10 mg by mouth at bedtime as needed for sleep.   Reviewed prior external information including notes and imaging from  primary care provider As well as notes that were available from care everywhere and other healthcare systems.  Past medical history, social, surgical and family history all reviewed in electronic medical record.  No pertanent information unless stated regarding to the chief complaint.   Review of Systems:  No headache, visual changes,  nausea, vomiting, diarrhea, constipation, dizziness, abdominal pain, skin rash, fevers, chills, night sweats, weight loss, swollen lymph nodes, body aches, joint swelling, chest pain, shortness of breath, mood changes. POSITIVE muscle aches  Objective  Blood pressure 138/84, pulse 78, height 5\' 7"  (1.702 m), weight 242 lb (109.8 kg), SpO2 98%.   General: No apparent distress alert and oriented x3 mood and affect normal, dressed appropriately.  HEENT: Pupils equal, extraocular movements intact  Respiratory: Patient's speak in full sentences and does not appear short of breath  Cardiovascular: No lower extremity edema, non tender, no erythema  Loss of lordosis with positive faber tightness with extension of the back.  Does have some tightness in the gluteal area as well.  No weakness of the lower extremities. Negative Tinel's at the left wrist.  No thenar eminence wasting   Impression and Recommendations:    The above documentation has been reviewed and is accurate and complete Judi Saa, DO

## 2023-02-10 ENCOUNTER — Other Ambulatory Visit: Payer: Self-pay

## 2023-02-10 ENCOUNTER — Ambulatory Visit: Payer: Medicare Other | Admitting: Family Medicine

## 2023-02-10 VITALS — BP 138/84 | HR 78 | Ht 67.0 in | Wt 242.0 lb

## 2023-02-10 DIAGNOSIS — G5603 Carpal tunnel syndrome, bilateral upper limbs: Secondary | ICD-10-CM | POA: Diagnosis not present

## 2023-02-10 DIAGNOSIS — M9904 Segmental and somatic dysfunction of sacral region: Secondary | ICD-10-CM

## 2023-02-10 NOTE — Assessment & Plan Note (Signed)
Mild exacerbation again at this time.  Having some mild radicular symptoms into the gluteal area.  We discussed with patient at 1 minute jog and 1 minute walk noted.  We discussed increasing activity slowly otherwise.  Discussed home exercises and icing regimen.  Follow-up again in 6 to 8 weeks.

## 2023-02-10 NOTE — Assessment & Plan Note (Signed)
Left-sided doing significantly better at this time.  Held on any other significant changes.  Will follow-up with me again as needed

## 2023-06-13 ENCOUNTER — Ambulatory Visit: Payer: Medicare Other | Attending: Cardiology | Admitting: Cardiology

## 2023-06-13 NOTE — Progress Notes (Deleted)
  Cardiology Office Note:  .   Date:  06/13/2023  ID:  Steven Contreras, DOB 1955/06/26, MRN [id number] PCP: [name], MD  [facility] Providers Cardiologist:  Truett Mainland, MD PCP: [name], MD  No chief complaint on file.     History of Present Illness: .    Steven Contreras is a 68 y.o. male with hypertension, hyperlipidemia, type 2 DM, elevated calcium score, CAD without angina    ***   There were no vitals filed for this visit.   ROS: *** ROS   Studies Reviewed: .        *** Independently interpreted ***/202***: Chol ***, TG ***, HDL ***, LDL *** HbA1C ***% Hb *** Cr *** ***  Risk Assessment/Calculations:   {Does this patient have ATRIAL FIBRILLATION?:[phone]}     Physical Exam:   Physical Exam   VISIT DIAGNOSES: No diagnosis found.   ASSESSMENT AND PLAN: .    Steven Contreras is a 68 y.o. male with hypertension, hyperlipidemia, type 2 DM, elevated calcium score, CAD without angina    *** CAD without angina:  Nuclear stress test (03/2020) reported as "moderate defect in the lateral, inferior and apical regions, due to prominent gut uptake artifact possibly large hiatal hernia noted in the inferolateral wall. In the absence of wall motion abnormality and normal LVEF, ischemia is less likely". However, elevated calcium score with in all three vessels suggest ischemia cannot be excluded. There is no left main calcification. He does not have any angina symptoms. Reasonable continue medical management for now.  In absence of bleeding, continue Aspirin. Continue rosuvastatin 20 mg, nebivolol 10 mg daily.     Mixed hyperlipidemia: Chol 185, TG 277, HDL 65, LDL 76 (06/2022) High triglyceride likely due to uncontrolled diabetes mellitus.  Continue resistant 10 mg daily, management for diabetes as per PCP.   Type 2 DM: Managed by PCP.   Hypertension: Generally well controlled.  Blood pressure is 124/74 mmHg during annual physical  with PCP few days ago.  At home, patient states that his blood pressure has been "the best ever it has been".  I suspect today's elevated number could potentially be whitecoat hypertension. Blood pressure was better on second check. No change made today.  Continue regular monitoring of blood pressure at home.      {Are you ordering a CV Procedure (e.g. stress test, cath, DCCV, TEE, etc)?   Press F2        :[id number]}    No orders of the defined types were placed in this encounter.    F/u in ***  Signed, Elder Negus, MD

## 2023-07-25 LAB — LAB REPORT - SCANNED
A1c: 7.1
Albumin, Urine POC: 13.55
Creatinine, POC: 37 mg/dL
EGFR: 93

## 2023-08-10 ENCOUNTER — Other Ambulatory Visit: Payer: Self-pay | Admitting: Cardiology

## 2023-08-10 DIAGNOSIS — E782 Mixed hyperlipidemia: Secondary | ICD-10-CM

## 2023-08-23 ENCOUNTER — Encounter: Payer: Self-pay | Admitting: Family Medicine

## 2023-08-23 ENCOUNTER — Ambulatory Visit (INDEPENDENT_AMBULATORY_CARE_PROVIDER_SITE_OTHER)

## 2023-08-23 ENCOUNTER — Ambulatory Visit: Admitting: Family Medicine

## 2023-08-23 VITALS — BP 124/84 | HR 74 | Ht 67.0 in | Wt 243.0 lb

## 2023-08-23 DIAGNOSIS — S134XXA Sprain of ligaments of cervical spine, initial encounter: Secondary | ICD-10-CM

## 2023-08-23 DIAGNOSIS — M25512 Pain in left shoulder: Secondary | ICD-10-CM | POA: Diagnosis not present

## 2023-08-23 DIAGNOSIS — M542 Cervicalgia: Secondary | ICD-10-CM | POA: Diagnosis not present

## 2023-08-23 MED ORDER — METHOCARBAMOL 500 MG PO TABS: 500.0000 mg | ORAL_TABLET | Freq: Every evening | ORAL | 0 refills | Status: AC

## 2023-08-23 MED ORDER — KETOROLAC TROMETHAMINE 60 MG/2ML IM SOLN
60.0000 mg | Freq: Once | INTRAMUSCULAR | Status: AC
  Administered 2023-08-23: 60 mg via INTRAMUSCULAR

## 2023-08-23 MED ORDER — METHYLPREDNISOLONE ACETATE 80 MG/ML IJ SUSP
80.0000 mg | Freq: Once | INTRAMUSCULAR | Status: AC
  Administered 2023-08-23: 80 mg via INTRAMUSCULAR

## 2023-08-23 NOTE — Progress Notes (Signed)
 [facility] [address] [place] [address] Phone: [phone] Subjective:   Steven Contreras, am serving as a scribe for Dr. Ronnell Coins.  I'm seeing this patient by the request  of:  Olin Bertin, MD  CC: Neck pain  BJY:[id number]  Steven Contreras is a 68 y.o. male coming in with complaint of cervical spine pain since Sunday. Patient states that he lifted a 50# bag of dog food and developed tightness and pain in L side. Tried massage, IBU, Tylenol , Zanaflex , Tramadol heat and ice and these all made his pain worse. Unable to sleep due to burning pain. Mentions that he injured L shoulder in January and had injection. No pain in shoulder today. Feels weakness in arm.   Also mentions achiness in R hand and wrist. Has trigger finger that is sticking intermittently, L ring finger.      Past Medical History:  Diagnosis Date   ADD (attention deficit disorder)    Anxiety    hx of   Arthritis    Diabetes (HCC)    Eczema    Family history of adverse reaction to anesthesia    mother slow to awaken   Headache    High cholesterol    History of kidney stones    Hypertension    Hyperthyroidism    Right rotator cuff tear    Sleep apnea    Past Surgical History:  Procedure Laterality Date   APPENDECTOMY     when he was [age]   colonscopy  2014   IR URETERAL STENT LEFT NEW ACCESS W/O SEP NEPHROSTOMY CATH  12/29/2020   NEPHROLITHOTOMY Left 12/29/2020   Procedure: LEFT NEPHROLITHOTOMY PERCUTANEOUS;  Surgeon: Samson Croak, MD;  Location: [facility];  Service: Urology;  Laterality: Left;   SHOULDER SURGERY Left years ago   rotator, bicep and clavicle repair.   SHOULDER SURGERY Right 2016   TOTAL HIP ARTHROPLASTY Right 10/08/2014   Procedure: RIGHT TOTAL HIP ARTHROPLASTY ANTERIOR APPROACH;  Surgeon: Claiborne Crew, MD;  Location: [facility];  Service: Orthopedics;  Laterality: Right;   TRANSFORAMINAL LUMBAR INTERBODY FUSION (TLIF) WITH PEDICLE SCREW  FIXATION 2 LEVEL Right 01/06/2022   Procedure: RIGHT-SIDED LUMBAR 4 - LUMBAR 5, LUMBAR 5- SACRUM 1 TRANSFORAMINAL LUMBAR INTERBODY FUSION AND DECOMPRESSION WITH INSTRUMENTATION AND ALLOGRAFT;  Surgeon: Virl Grimes, MD;  Location: [facility];  Service: Orthopedics;  Laterality: Right;   Social History   Socioeconomic History   Marital status: Married    Spouse name: Mary   Number of children: 3   Years of education: Not on file   Highest education level: Not on file  Occupational History   Not on file  Tobacco Use   Smoking status: Former    Current packs/day: 0.00    Average packs/day: 0.5 packs/day for 15.0 years (7.5 ttl pk-yrs)    Types: Cigarettes    Start date: 05/04/1991    Quit date: 05/03/2006    Years since quitting: 17.3   Smokeless tobacco: Never  Vaping Use   Vaping status: Former  Substance and Sexual Activity   Alcohol  use: Yes    Comment: rarely   Drug use: Never   Sexual activity: Not Currently  Other Topics Concern   Not on file  Social History Narrative   Not on file   Social Drivers of Health   Financial Resource Strain: Not on file  Food Insecurity: Not on file  Transportation Needs: Not on file  Physical Activity:  Not on file  Stress: Not on file  Social Connections: Unknown (09/15/2021)   Received from [facility], [facility]   Social Network    Social Network: Not on file   Allergies  Allergen Reactions   Ace Inhibitors Cough   Ampicillin     NOT sure if its ampicillin or amoxicillin---stomach cramps   Effexor [Venlafaxine] Other (See Comments)    PT DOES NOT REMEMBER REACTION    Semaglutide Other (See Comments)   Wellbutrin [Bupropion] Other (See Comments)    PT DOES NOT REMEMBER REACTION    Family History  Problem Relation Age of Onset   Pancreatic cancer Mother    Prostate cancer Father    Allergic rhinitis Sister     Current Outpatient Medications (Endocrine & Metabolic):    insulin  glargine (LANTUS  SOLOSTAR) 100 UNIT/ML Solostar  Pen, Inject 40 Units into the skin daily.   metFORMIN  (GLUCOPHAGE ) 500 MG tablet, Take 1,000 mg by mouth 2 (two) times daily.   MOUNJARO 2.5 MG/0.5ML Pen, Inject into the skin.  Current Outpatient Medications (Cardiovascular):    amLODipine  (NORVASC ) 10 MG tablet, Take 10 mg by mouth daily.   furosemide  (LASIX ) 40 MG tablet, Take 40 mg by mouth daily.   nebivolol  (BYSTOLIC ) 5 MG tablet, Take 2 tablets (10 mg total) by mouth daily. (Patient taking differently: Take 5 mg by mouth daily.)   rosuvastatin  (CRESTOR ) 20 MG tablet, Take 1 tablet (20 mg total) by mouth daily. Please call office to schedule an appt for further refills. Thank you  Current Outpatient Medications (Respiratory):    loratadine  (CLARITIN ) 10 MG tablet, Take 10 mg by mouth daily as needed for allergies.  Current Outpatient Medications (Analgesics):    aspirin  EC 81 MG tablet, Take 81 mg by mouth daily. Swallow whole.   oxyCODONE -acetaminophen  (PERCOCET/ROXICET) 5-325 MG tablet, Take 1-2 tablets by mouth every 4 (four) hours as needed for severe pain.   Current Outpatient Medications (Other):    Cholecalciferol  (VITAMIN D ) 125 MCG (5000 UT) CAPS, Take 5,000 Units by mouth daily.   citalopram  (CELEXA ) 40 MG tablet, Take 40 mg by mouth daily.   Colloidal Oatmeal (GOLD BOND ECZEMA RELIEF) 2 % CREA, Apply 1 application topically daily as needed (eczema).   Magnesium Gluconate 250 MG TABS, 1 tablet Orally Once a day   methocarbamol  (ROBAXIN ) 500 MG tablet, Take 1 tablet (500 mg total) by mouth at bedtime.   polyvinyl alcohol  (LIQUIFILM TEARS) 1.4 % ophthalmic solution, Place 1 drop into both eyes 4 (four) times daily as needed for dry eyes.   potassium gluconate 595 (99 K) MG TABS tablet, Take 595 mg by mouth daily.   tiZANidine  (ZANAFLEX ) 2 MG tablet, Take 1 tablet (2 mg total) by mouth every 6 (six) hours as needed for muscle spasms.   zolpidem  (AMBIEN ) 10 MG tablet, Take 10 mg by mouth at bedtime as needed for  sleep.   Reviewed prior external information including notes and imaging from  primary care provider As well as notes that were available from care everywhere and other healthcare systems.  Past medical history, social, surgical and family history all reviewed in electronic medical record.  No pertanent information unless stated regarding to the chief complaint.   Review of Systems:  No headache, visual changes, nausea, vomiting, diarrhea, constipation, dizziness, abdominal pain, skin rash, fevers, chills, night sweats, weight loss, swollen lymph nodes, body aches, joint swelling, chest pain, shortness of breath, mood changes. POSITIVE muscle aches  Objective  Blood pressure  124/84, pulse 74, height 5\' 7"  (1.702 m), weight 243 lb (110.2 kg), SpO2 97%.   General: No apparent distress alert and oriented x3 mood and affect normal, dressed appropriately.  HEENT: Pupils equal, extraocular movements intact  Respiratory: Patient's speak in full sentences and does not appear short of breath  Cardiovascular: No lower extremity edema, non tender, no erythema  Neck exam does have some loss lordosis.  Worsening pain with extension or flexion of the neck.  Negative Spurling's.  Pain seems to be significantly worse actually on the left side of the neck.  Difficulty with sidebending bilaterally.    Impression and Recommendations:     The above documentation has been reviewed and is accurate and complete Rylei Codispoti M Issac Moure, DO

## 2023-08-23 NOTE — Assessment & Plan Note (Signed)
 Whiplash of the neck noted.  Discussed icing regimen and home exercises.  Discussed icing regimen and home exercises.  Discussed avoiding certain activities.  Toradol  and Depo-Medrol  injections given today.  Encourage patient to continue to stay active and continuing to work on weight loss.  We discussed lifting mechanics and avoiding certain repetitive activities at the moment.  Will get x-rays to make sure there is no other bony abnormality that could be contributing.  New muscle relaxer and Robaxin  500 mg at night prescribed.  Follow-up again in 2 to 4 weeks

## 2023-08-23 NOTE — Patient Instructions (Addendum)
 Injections in backside No antiinflammatory today Robaxin  500mg   CoQ10 200mg  daily Xray today Exercises See me again in 4-6 weeks

## 2023-08-24 ENCOUNTER — Encounter: Payer: Self-pay | Admitting: Family Medicine

## 2023-08-27 IMAGING — XA Imaging study
2 series · 2 of 2 positions shown · non-contrast
Comparison: none

CLINICAL DATA: Lumbosacral spondylosis without myelopathy. Chronic
low back and bilateral leg pain. No prior injections or surgery.

[Series 1: ortho standard · 1 of 1 slices shown (1 of 2)]
[im 1/1]
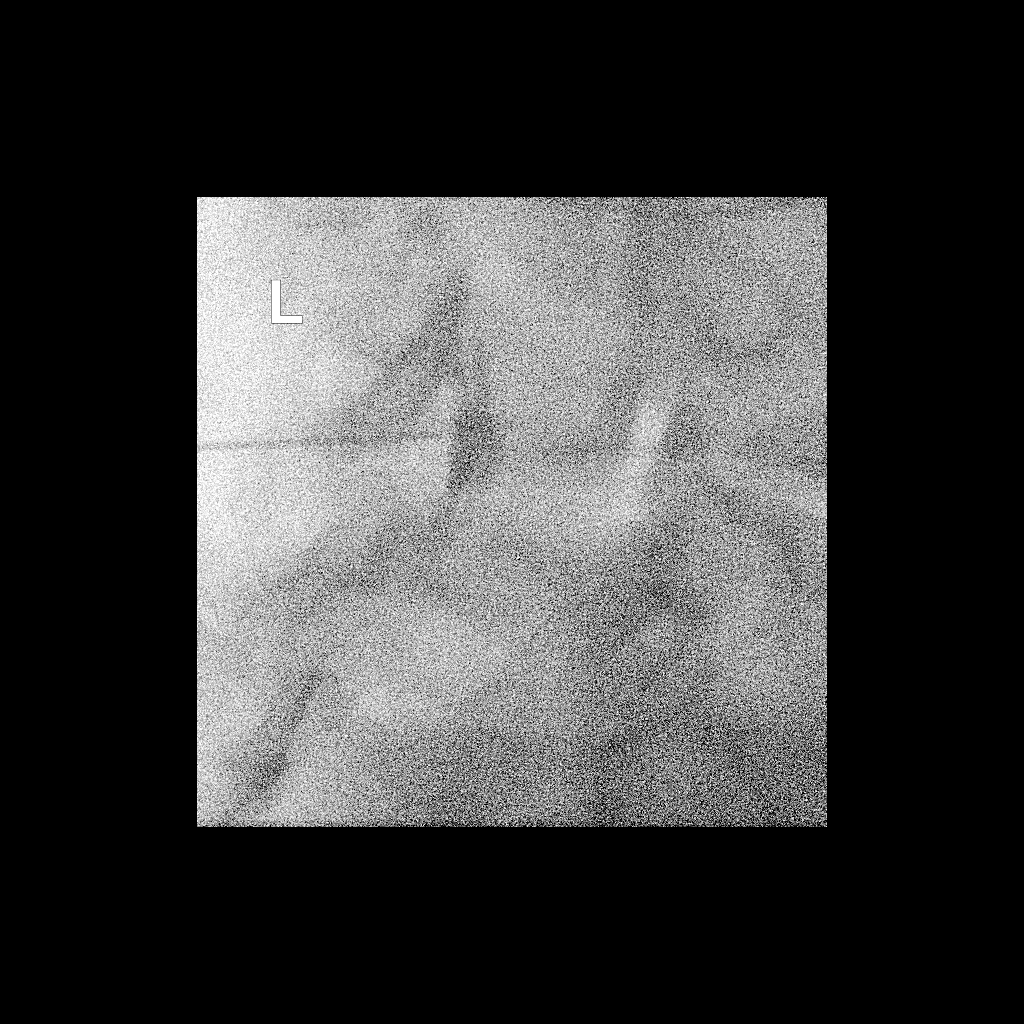

[Series 2: ortho standard · 1 of 1 slices shown (2 of 2)]
[im 1/1]
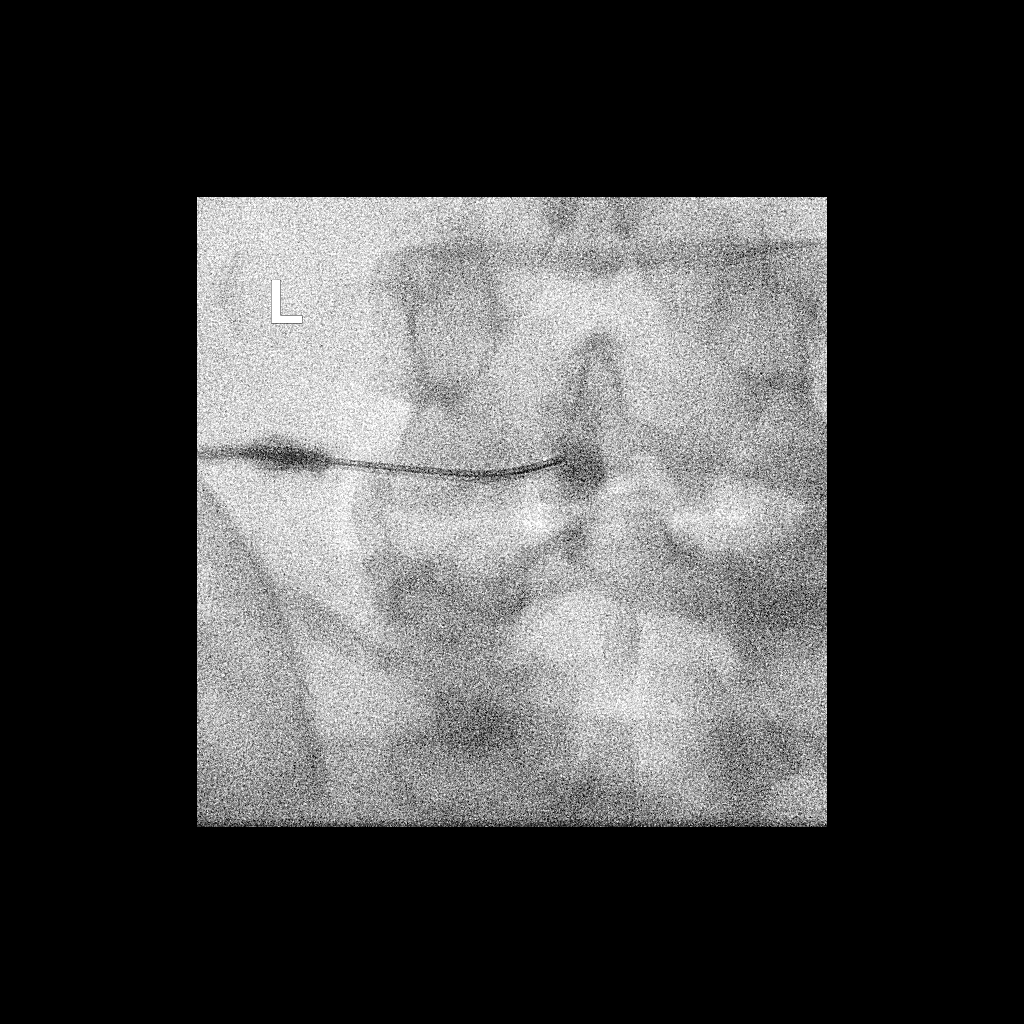

[2 of 2 positions shown; findings below may reference images not displayed]

FLUOROSCOPY TIME:  Radiation Exposure Index (as provided by the
fluoroscopic device): 2.0 mGy

Fluoroscopy Time:  16 seconds

Number of Acquired Images:  0

PROCEDURE:
The procedure, risks, benefits, and alternatives were explained to
the patient. Questions regarding the procedure were encouraged and
answered. The patient understands and consents to the procedure.

LUMBAR EPIDURAL INJECTION:

An interlaminar approach was performed on the left at L4-L5. The
overlying skin was cleansed and anesthetized. A 3.5 inch 20 gauge
epidural needle was advanced using loss-of-resistance technique.

DIAGNOSTIC EPIDURAL INJECTION:

Injection of Isovue-M 200 shows a good epidural pattern with spread
above and below the level of needle placement, primarily on the
left. No vascular opacification is seen.

THERAPEUTIC EPIDURAL INJECTION:

80 mg of Depo-Medrol mixed with 3 mL of 1% lidocaine were instilled.
The procedure was well-tolerated, and the patient was discharged
thirty minutes following the injection in good condition.

COMPLICATIONS:
None immediate.
IMPRESSION: Technically successful interlaminar epidural injection on the left
at L4-L5.

## 2023-09-02 ENCOUNTER — Encounter: Payer: Self-pay | Admitting: Family Medicine

## 2023-09-10 IMAGING — XA Imaging study
2 series · 2 of 2 positions shown · non-contrast
Comparison: none

CLINICAL DATA: Some improvement from the initial injection but with
persistent bilateral lower extremity burning sensation and pain
radiating to the left thigh in particular.

[Series 1: ortho standard · 1 of 1 slices shown (1 of 2)]
[im 1/1]
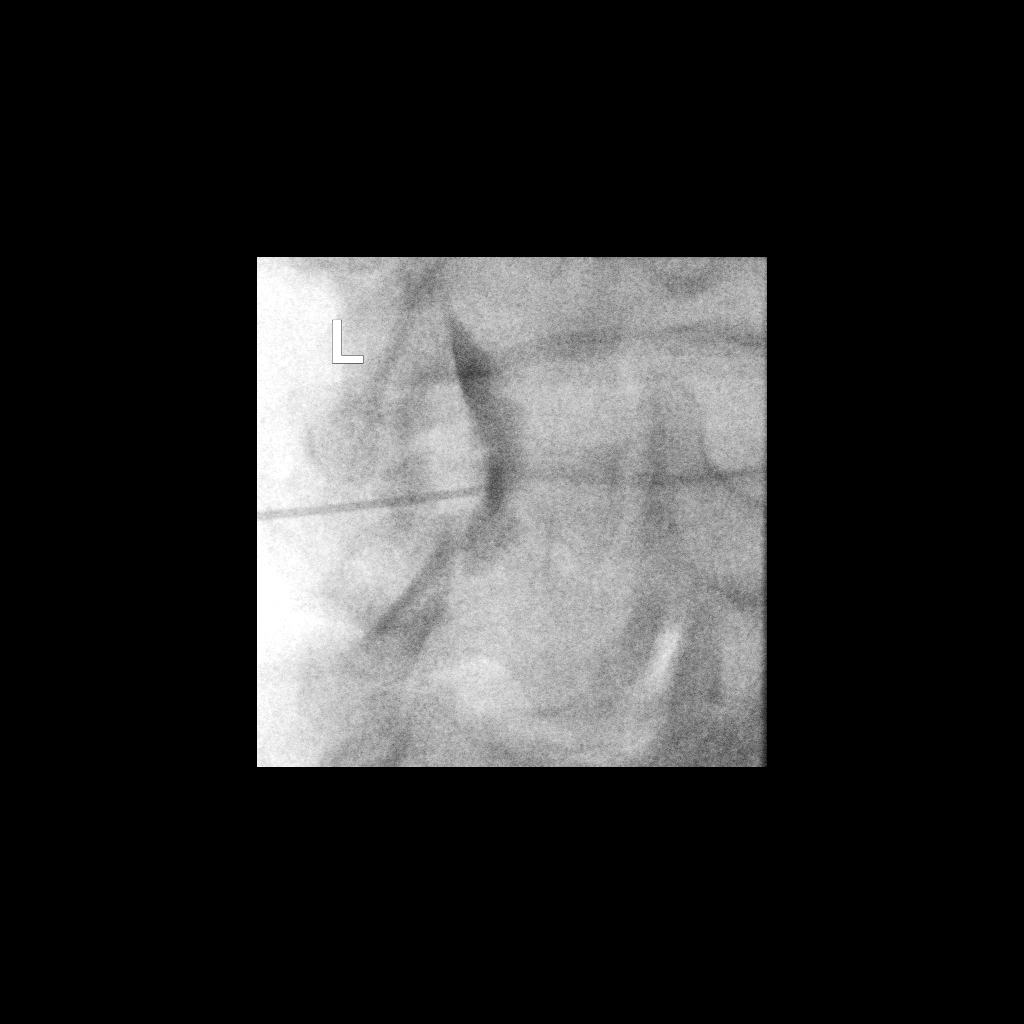

[Series 2: ortho standard · 1 of 1 slices shown (2 of 2)]
[im 1/1]
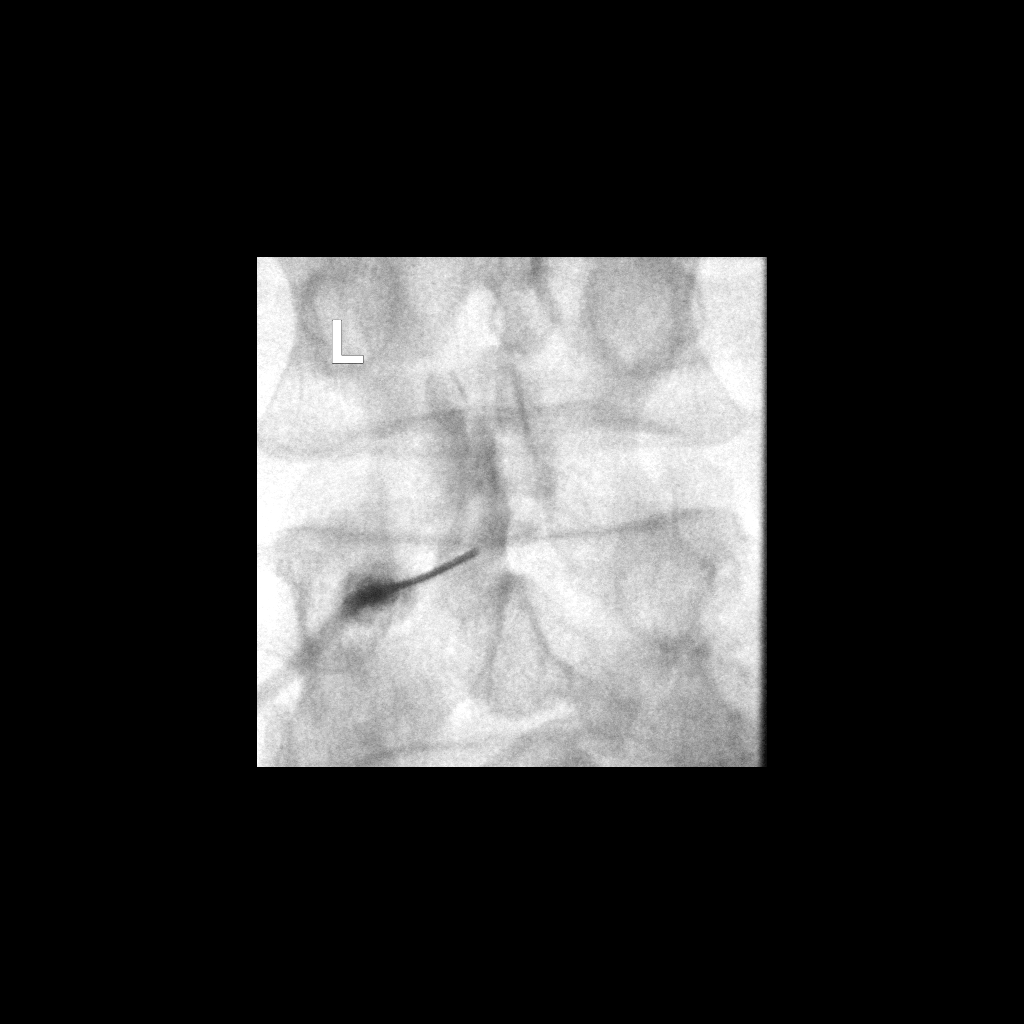

[2 of 2 positions shown; findings below may reference images not displayed]

FLUOROSCOPY TIME:  0 minutes 39 seconds. 35.50 micro gray meter
squared

PROCEDURE:
The procedure, risks, benefits, and alternatives were explained to
the patient. Questions regarding the procedure were encouraged and
answered. The patient understands and consents to the procedure.

LUMBAR EPIDURAL INJECTION:

An interlaminar approach was performed on the left at L3-4. The
overlying skin was cleansed and anesthetized. A 20 gauge epidural
needle was advanced using loss-of-resistance technique.

DIAGNOSTIC EPIDURAL INJECTION:

Injection of Isovue-M 200 shows a good epidural pattern with spread
above and below the level of needle placement, primarily on the
left. No vascular opacification is seen.

THERAPEUTIC EPIDURAL INJECTION:

Eighty mg of Depo-Medrol mixed with 2.5 cc 1% lidocaine were
instilled. The procedure was well-tolerated, and the patient was
discharged thirty minutes following the injection in good condition.

COMPLICATIONS:
None
IMPRESSION: Technically successful epidural injection on the left at L3-4 # 2.

## 2023-09-16 LAB — LAB REPORT - SCANNED
A1c: 6.6
EGFR (Non-African Amer.): 89

## 2023-09-23 NOTE — Progress Notes (Unsigned)
 [facility] [address] [place] [address] Phone: [phone] Subjective:   Steven Contreras, am serving as a scribe for Dr. Ronnell Coins.  I'm seeing this patient by the request  of:  Olin Bertin, MD  CC: Neck pain  [id number]  08/23/2023 Whiplash of the neck noted.  Discussed icing regimen and home exercises.  Discussed icing regimen and home exercises.  Discussed avoiding certain activities.  Toradol  and Depo-Medrol  injections given today.  Encourage patient to continue to stay active and continuing to work on weight loss.  We discussed lifting mechanics and avoiding certain repetitive activities at the moment.  Will get x-rays to make sure there is no other bony abnormality that could be contributing.  New muscle relaxer and Robaxin  500 mg at night prescribed.  Follow-up again in 2 to 4 weeks     Updated 09/29/2023 Steven Contreras is a 68 y.o. male coming in with complaint of neck pain. Patient states that his neck pain subsided.   PT is coming to his house to work on L lumbar spine and L leg pain. OTC medication that he tried seems to help radicular symptoms.       Past Medical History:  Diagnosis Date   ADD (attention deficit disorder)    Anxiety    hx of   Arthritis    Diabetes (HCC)    Eczema    Family history of adverse reaction to anesthesia    mother slow to awaken   Headache    High cholesterol    History of kidney stones    Hypertension    Hyperthyroidism    Right rotator cuff tear    Sleep apnea    Past Surgical History:  Procedure Laterality Date   APPENDECTOMY     when he was [age]   colonscopy  2014   IR URETERAL STENT LEFT NEW ACCESS W/O SEP NEPHROSTOMY CATH  12/29/2020   NEPHROLITHOTOMY Left 12/29/2020   Procedure: LEFT NEPHROLITHOTOMY PERCUTANEOUS;  Surgeon: Samson Croak, MD;  Location: [facility];  Service: Urology;  Laterality: Left;   SHOULDER SURGERY Left years ago   rotator, bicep and clavicle  repair.   SHOULDER SURGERY Right 2016   TOTAL HIP ARTHROPLASTY Right 10/08/2014   Procedure: RIGHT TOTAL HIP ARTHROPLASTY ANTERIOR APPROACH;  Surgeon: Claiborne Crew, MD;  Location: [facility];  Service: Orthopedics;  Laterality: Right;   TRANSFORAMINAL LUMBAR INTERBODY FUSION (TLIF) WITH PEDICLE SCREW FIXATION 2 LEVEL Right 01/06/2022   Procedure: RIGHT-SIDED LUMBAR 4 - LUMBAR 5, LUMBAR 5- SACRUM 1 TRANSFORAMINAL LUMBAR INTERBODY FUSION AND DECOMPRESSION WITH INSTRUMENTATION AND ALLOGRAFT;  Surgeon: Virl Grimes, MD;  Location: [facility];  Service: Orthopedics;  Laterality: Right;   Social History   Socioeconomic History   Marital status: Married    Spouse name: Mary   Number of children: 3   Years of education: Not on file   Highest education level: Not on file  Occupational History   Not on file  Tobacco Use   Smoking status: Former    Current packs/day: 0.00    Average packs/day: 0.5 packs/day for 15.0 years (7.5 ttl pk-yrs)    Types: Cigarettes    Start date: 05/04/1991    Quit date: 05/03/2006    Years since quitting: 17.4   Smokeless tobacco: Never  Vaping Use   Vaping status: Former  Substance and Sexual Activity   Alcohol  use: Yes    Comment: rarely   Drug use: Never  Sexual activity: Not Currently  Other Topics Concern   Not on file  Social History Narrative   Not on file   Social Drivers of Health   Financial Resource Strain: Not on file  Food Insecurity: Not on file  Transportation Needs: Not on file  Physical Activity: Not on file  Stress: Not on file  Social Connections: Unknown (09/15/2021)   Received from [facility], [facility]   Social Network    Social Network: Not on file   Allergies  Allergen Reactions   Ace Inhibitors Cough   Ampicillin     NOT sure if its ampicillin or amoxicillin---stomach cramps   Effexor [Venlafaxine] Other (See Comments)    PT DOES NOT REMEMBER REACTION    Semaglutide Other (See Comments)   Wellbutrin [Bupropion] Other (See  Comments)    PT DOES NOT REMEMBER REACTION    Family History  Problem Relation Age of Onset   Pancreatic cancer Mother    Prostate cancer Father    Allergic rhinitis Sister     Current Outpatient Medications (Endocrine & Metabolic):    insulin  glargine (LANTUS  SOLOSTAR) 100 UNIT/ML Solostar Pen, Inject 40 Units into the skin daily.   metFORMIN  (GLUCOPHAGE ) 500 MG tablet, Take 1,000 mg by mouth 2 (two) times daily.   MOUNJARO 2.5 MG/0.5ML Pen, Inject into the skin.  Current Outpatient Medications (Cardiovascular):    amLODipine  (NORVASC ) 10 MG tablet, Take 10 mg by mouth daily.   furosemide  (LASIX ) 40 MG tablet, Take 40 mg by mouth daily.   nebivolol  (BYSTOLIC ) 5 MG tablet, Take 2 tablets (10 mg total) by mouth daily. (Patient taking differently: Take 5 mg by mouth daily.)   rosuvastatin  (CRESTOR ) 20 MG tablet, Take 1 tablet (20 mg total) by mouth daily. Please call office to schedule an appt for further refills. Thank you  Current Outpatient Medications (Respiratory):    loratadine  (CLARITIN ) 10 MG tablet, Take 10 mg by mouth daily as needed for allergies.  Current Outpatient Medications (Analgesics):    aspirin  EC 81 MG tablet, Take 81 mg by mouth daily. Swallow whole.   oxyCODONE -acetaminophen  (PERCOCET/ROXICET) 5-325 MG tablet, Take 1-2 tablets by mouth every 4 (four) hours as needed for severe pain.   Current Outpatient Medications (Other):    Cholecalciferol  (VITAMIN D ) 125 MCG (5000 UT) CAPS, Take 5,000 Units by mouth daily.   citalopram  (CELEXA ) 40 MG tablet, Take 40 mg by mouth daily.   Colloidal Oatmeal (GOLD BOND ECZEMA RELIEF) 2 % CREA, Apply 1 application topically daily as needed (eczema).   Magnesium Gluconate 250 MG TABS, 1 tablet Orally Once a day   methocarbamol  (ROBAXIN ) 500 MG tablet, Take 1 tablet (500 mg total) by mouth at bedtime.   polyvinyl alcohol  (LIQUIFILM TEARS) 1.4 % ophthalmic solution, Place 1 drop into both eyes 4 (four) times daily as needed for dry  eyes.   potassium gluconate 595 (99 K) MG TABS tablet, Take 595 mg by mouth daily.   tiZANidine  (ZANAFLEX ) 2 MG tablet, Take 1 tablet (2 mg total) by mouth every 6 (six) hours as needed for muscle spasms.   zolpidem  (AMBIEN ) 10 MG tablet, Take 10 mg by mouth at bedtime as needed for sleep.   Reviewed prior external information including notes and imaging from  primary care provider As well as notes that were available from care everywhere and other healthcare systems.  Past medical history, social, surgical and family history all reviewed in electronic medical record.  No pertanent information unless stated regarding  to the chief complaint.   Review of Systems:  No headache, visual changes, nausea, vomiting, diarrhea, constipation, dizziness, abdominal pain, skin rash, fevers, chills, night sweats, weight loss, swollen lymph nodes, body aches, joint swelling, chest pain, shortness of breath, mood changes. POSITIVE muscle aches  Objective  Blood pressure 116/78, pulse 77, height 5\' 7"  (1.702 m), weight 231 lb (104.8 kg), SpO2 98%.   General: No apparent distress alert and oriented x3 mood and affect normal, dressed appropriately.  HEENT: Pupils equal, extraocular movements intact  Respiratory: Patient's speak in full sentences and does not appear short of breath  Cardiovascular: No lower extremity edema, non tender, no erythema  Neck exam shows patient does have some tightness noted.  Seems to be more in the low back.  Still has some deficiency in core strength.  Unable to do [place] secondary to tightness of the hips.    Impression and Recommendations:     The above documentation has been reviewed and is accurate and complete Steven Contreras M Malvina Schadler, DO

## 2023-09-27 ENCOUNTER — Other Ambulatory Visit: Payer: Self-pay | Admitting: Family Medicine

## 2023-09-27 DIAGNOSIS — I1A0 Resistant hypertension: Secondary | ICD-10-CM

## 2023-09-29 ENCOUNTER — Ambulatory Visit: Admitting: Family Medicine

## 2023-09-29 VITALS — BP 116/78 | HR 77 | Ht 67.0 in | Wt 231.0 lb

## 2023-09-29 DIAGNOSIS — M545 Low back pain, unspecified: Secondary | ICD-10-CM

## 2023-09-29 DIAGNOSIS — M461 Sacroiliitis, not elsewhere classified: Secondary | ICD-10-CM | POA: Diagnosis not present

## 2023-09-29 DIAGNOSIS — S134XXA Sprain of ligaments of cervical spine, initial encounter: Secondary | ICD-10-CM

## 2023-09-29 DIAGNOSIS — S134XXD Sprain of ligaments of cervical spine, subsequent encounter: Secondary | ICD-10-CM

## 2023-09-29 NOTE — Assessment & Plan Note (Signed)
 Discussed with patient at great length.  He does have some signs and symptoms of some lumbar pathology again.  Has had surgical intervention of the back previously.  We discussed with patient though with him doing relatively well and continuing on the weight loss journey that we will continue to monitor.  Follow-up again in 6 to 8 weeks otherwise.

## 2023-09-29 NOTE — Patient Instructions (Addendum)
 Goal weight 215lb See me in 2 months

## 2023-09-29 NOTE — Assessment & Plan Note (Signed)
 Improved at this time and seems to be doing very well.  Seems to be nearly completely resolved at this time.

## 2023-10-05 ENCOUNTER — Ambulatory Visit
Admission: RE | Admit: 2023-10-05 | Discharge: 2023-10-05 | Disposition: A | Discharge status: Home / Self Care | Source: Ambulatory Visit | Attending: Family Medicine | Admitting: Family Medicine

## 2023-10-05 DIAGNOSIS — I1A0 Resistant hypertension: Secondary | ICD-10-CM

## 2023-11-28 NOTE — Progress Notes (Unsigned)
 [facility] [address] [place] [address] Phone: [phone] Subjective:   ISusannah Contreras, am serving as a scribe for Dr. Arthea Claudene.  I'm seeing this patient by the request  of:  Steven Mems, MD  CC: Low back pain follow-up  YEP:[id number]  09/29/2023 Improved at this time and seems to be doing very well. Seems to be nearly completely resolved at this time.  Discussed with patient at great length. He does have some signs and symptoms of some lumbar pathology again. Has had surgical intervention of the back previously. We discussed with patient though with him doing relatively well and continuing on the weight loss journey that we will continue to monitor. Follow-up again in 6 to 8 weeks otherwise.   Updated 11/29/2023 Steven Contreras Force is a 68 y.o. male coming in with complaint of back and R SI pain, seems to be more of a whiplash injury but was improving. No pain in back or neck. Losing weight.      Past Medical History:  Diagnosis Date   ADD (attention deficit disorder)    Anxiety    hx of   Arthritis    Diabetes (HCC)    Eczema    Family history of adverse reaction to anesthesia    mother slow to awaken   Headache    High cholesterol    History of kidney stones    Hypertension    Hyperthyroidism    Right rotator cuff tear    Sleep apnea    Past Surgical History:  Procedure Laterality Date   APPENDECTOMY     when he was [age]   colonscopy  2014   IR URETERAL STENT LEFT NEW ACCESS W/O SEP NEPHROSTOMY CATH  12/29/2020   NEPHROLITHOTOMY Left 12/29/2020   Procedure: LEFT NEPHROLITHOTOMY PERCUTANEOUS;  Surgeon: Steven Sherwood JONETTA DOUGLAS, MD;  Location: [facility];  Service: Urology;  Laterality: Left;   SHOULDER SURGERY Left years ago   rotator, bicep and clavicle repair.   SHOULDER SURGERY Right 2016   TOTAL HIP ARTHROPLASTY Right 10/08/2014   Procedure: RIGHT TOTAL HIP ARTHROPLASTY ANTERIOR APPROACH;  Surgeon: Steven Car, MD;   Location: [facility];  Service: Orthopedics;  Laterality: Right;   TRANSFORAMINAL LUMBAR INTERBODY FUSION (TLIF) WITH PEDICLE SCREW FIXATION 2 LEVEL Right 01/06/2022   Procedure: RIGHT-SIDED LUMBAR 4 - LUMBAR 5, LUMBAR 5- SACRUM 1 TRANSFORAMINAL LUMBAR INTERBODY FUSION AND DECOMPRESSION WITH INSTRUMENTATION AND ALLOGRAFT;  Surgeon: Steven Anes, MD;  Location: [facility];  Service: Orthopedics;  Laterality: Right;   Social History   Socioeconomic History   Marital status: Married    Spouse name: Steven Contreras   Number of children: 3   Years of education: Not on file   Highest education level: Not on file  Occupational History   Not on file  Tobacco Use   Smoking status: Former    Current packs/day: 0.00    Average packs/day: 0.5 packs/day for 15.0 years (7.5 ttl pk-yrs)    Types: Cigarettes    Start date: 05/04/1991    Quit date: 05/03/2006    Years since quitting: 17.5   Smokeless tobacco: Never  Vaping Use   Vaping status: Former  Substance and Sexual Activity   Alcohol  use: Yes    Comment: rarely   Drug use: Never   Sexual activity: Not Currently  Other Topics Concern   Not on file  Social History Narrative   Not on file   Social Drivers of Health  Financial Resource Strain: Not on file  Food Insecurity: Not on file  Transportation Needs: Not on file  Physical Activity: Not on file  Stress: Not on file  Social Connections: Unknown (09/15/2021)   Received from [facility]   Social Network    Social Network: Not on file   Allergies  Allergen Reactions   Ace Inhibitors Cough   Ampicillin     NOT sure if its ampicillin or amoxicillin---stomach cramps   Effexor [Venlafaxine] Other (See Comments)    PT DOES NOT REMEMBER REACTION    Semaglutide Other (See Comments)   Wellbutrin [Bupropion] Other (See Comments)    PT DOES NOT REMEMBER REACTION    Family History  Problem Relation Age of Onset   Pancreatic cancer Mother    Prostate cancer Father    Allergic rhinitis Sister      Current Outpatient Medications (Endocrine & Metabolic):    insulin  glargine (LANTUS  SOLOSTAR) 100 UNIT/ML Solostar Pen, Inject 40 Units into the skin daily.   metFORMIN  (GLUCOPHAGE ) 500 MG tablet, Take 1,000 mg by mouth 2 (two) times daily.   MOUNJARO 2.5 MG/0.5ML Pen, Inject into the skin.  Current Outpatient Medications (Cardiovascular):    amLODipine  (NORVASC ) 10 MG tablet, Take 10 mg by mouth daily.   furosemide  (LASIX ) 40 MG tablet, Take 40 mg by mouth daily.   nebivolol  (BYSTOLIC ) 5 MG tablet, Take 2 tablets (10 mg total) by mouth daily. (Patient taking differently: Take 5 mg by mouth daily.)   rosuvastatin  (CRESTOR ) 20 MG tablet, Take 1 tablet (20 mg total) by mouth daily. Please call office to schedule an appt for further refills. Thank you  Current Outpatient Medications (Respiratory):    loratadine  (CLARITIN ) 10 MG tablet, Take 10 mg by mouth daily as needed for allergies.  Current Outpatient Medications (Analgesics):    aspirin  EC 81 MG tablet, Take 81 mg by mouth daily. Swallow whole.   oxyCODONE -acetaminophen  (PERCOCET/ROXICET) 5-325 MG tablet, Take 1-2 tablets by mouth every 4 (four) hours as needed for severe pain.   Current Outpatient Medications (Other):    Cholecalciferol  (VITAMIN D ) 125 MCG (5000 UT) CAPS, Take 5,000 Units by mouth daily.   citalopram  (CELEXA ) 40 MG tablet, Take 40 mg by mouth daily.   Colloidal Oatmeal (GOLD BOND ECZEMA RELIEF) 2 % CREA, Apply 1 application topically daily as needed (eczema).   Magnesium Gluconate 250 MG TABS, 1 tablet Orally Once a day   methocarbamol  (ROBAXIN ) 500 MG tablet, Take 1 tablet (500 mg total) by mouth at bedtime.   polyvinyl alcohol  (LIQUIFILM TEARS) 1.4 % ophthalmic solution, Place 1 drop into both eyes 4 (four) times daily as needed for dry eyes.   potassium gluconate 595 (99 K) MG TABS tablet, Take 595 mg by mouth daily.   tiZANidine  (ZANAFLEX ) 2 MG tablet, Take 1 tablet (2 mg total) by mouth every 6 (six) hours as  needed for muscle spasms.   zolpidem  (AMBIEN ) 10 MG tablet, Take 10 mg by mouth at bedtime as needed for sleep.   Reviewed prior external information including notes and imaging from  primary care provider As well as notes that were available from care everywhere and other healthcare systems.  Past medical history, social, surgical and family history all reviewed in electronic medical record.  No pertanent information unless stated regarding to the chief complaint.    Objective  Blood pressure 128/88, pulse 81, height 5' 7 (1.702 m), weight 225 lb (102.1 kg), SpO2 96%.   General: No apparent distress  alert and oriented x3 mood and affect normal, dressed appropriately.  HEENT: Pupils equal, extraocular movements intact  Respiratory: Patient's speak in full sentences and does not appear short of breath  Cardiovascular: No lower extremity edema, non tender, no erythema  Low back exam does have some loss lordosis noted. Right shoulder exam shows good range of motion noted.  No significant tenderness whatsoever.   Impression and Recommendations:     The above documentation has been reviewed and is accurate and complete Sharne Linders M Annisten Manchester, DO

## 2023-11-29 ENCOUNTER — Encounter: Payer: Self-pay | Admitting: Family Medicine

## 2023-11-29 ENCOUNTER — Ambulatory Visit (INDEPENDENT_AMBULATORY_CARE_PROVIDER_SITE_OTHER): Admitting: Family Medicine

## 2023-11-29 VITALS — BP 128/88 | HR 81 | Ht 67.0 in | Wt 225.0 lb

## 2023-11-29 DIAGNOSIS — M5416 Radiculopathy, lumbar region: Secondary | ICD-10-CM | POA: Diagnosis not present

## 2023-11-29 NOTE — Assessment & Plan Note (Signed)
 Sitting extremely comfortably this time.  Patient is doing well with his weight loss.  Encouraged him to continue.  Discussed icing.  Follow-up with me as

## 2023-12-12 ENCOUNTER — Encounter: Payer: Self-pay | Admitting: Family Medicine

## 2024-02-13 ENCOUNTER — Encounter: Payer: Self-pay | Admitting: Family Medicine

## 2024-02-13 ENCOUNTER — Ambulatory Visit (INDEPENDENT_AMBULATORY_CARE_PROVIDER_SITE_OTHER): Admitting: Family Medicine

## 2024-02-13 ENCOUNTER — Ambulatory Visit (INDEPENDENT_AMBULATORY_CARE_PROVIDER_SITE_OTHER)

## 2024-02-13 VITALS — BP 122/86 | HR 81 | Ht 67.0 in | Wt 220.0 lb

## 2024-02-13 DIAGNOSIS — M545 Low back pain, unspecified: Secondary | ICD-10-CM

## 2024-02-13 DIAGNOSIS — M5416 Radiculopathy, lumbar region: Secondary | ICD-10-CM | POA: Diagnosis not present

## 2024-02-13 MED ORDER — PREDNISONE 20 MG PO TABS: 20.0000 mg | ORAL_TABLET | Freq: Every day | ORAL | 0 refills | Status: AC

## 2024-02-13 MED ORDER — METHYLPREDNISOLONE ACETATE 80 MG/ML IJ SUSP
80.0000 mg | Freq: Once | INTRAMUSCULAR | Status: AC
  Administered 2024-02-13: 80 mg via INTRAMUSCULAR

## 2024-02-13 MED ORDER — KETOROLAC TROMETHAMINE 60 MG/2ML IM SOLN
60.0000 mg | Freq: Once | INTRAMUSCULAR | Status: AC
  Administered 2024-02-13: 60 mg via INTRAMUSCULAR

## 2024-02-13 NOTE — Progress Notes (Signed)
 [facility] [address] [place] [address] Phone: [phone] Subjective:   Steven Contreras, am serving as a scribe for Dr. Arthea Contreras.  I'm seeing this patient by the request  of:  Steven Mems, MD  CC: Low back pain  YEP:Steven Contreras  11/29/2023 Sitting extremely comfortably this time.  Patient is doing well with his weight loss.  Encouraged him to continue.  Discussed icing.  Follow-up with me as     Update 02/13/2024 Steven Contreras is a 68 y.o. male coming in with complaint of lower back pain. Patient states that 10 days ago, he unloaded truck with multiple loads of 50# as he was building a shed. Pain developed in B SI joints. Feels as bad as he did prior to his back surgery. Unable to stand up straight. Pain radiates down lateral aspect of L hip to his knee.     Past Medical History:  Diagnosis Date   ADD (attention deficit disorder)    Anxiety    hx of   Arthritis    Diabetes (HCC)    Eczema    Family history of adverse reaction to anesthesia    mother slow to awaken   Headache    High cholesterol    History of kidney stones    Hypertension    Hyperthyroidism    Right rotator cuff tear    Sleep apnea    Past Surgical History:  Procedure Laterality Date   APPENDECTOMY     when he was [age]   colonscopy  2014   IR URETERAL STENT LEFT NEW ACCESS W/O SEP NEPHROSTOMY CATH  12/29/2020   NEPHROLITHOTOMY Left 12/29/2020   Procedure: LEFT NEPHROLITHOTOMY PERCUTANEOUS;  Surgeon: Steven Contreras Steven DOUGLAS, MD;  [facility]: [facility];  Service: Urology;  Laterality: Left;   SHOULDER SURGERY Left years ago   rotator, bicep and clavicle repair.   SHOULDER SURGERY Right 2016   TOTAL HIP ARTHROPLASTY Right 10/08/2014   Procedure: RIGHT TOTAL HIP ARTHROPLASTY ANTERIOR APPROACH;  Surgeon: Steven Car, MD;  Location: [facility];  Service: Orthopedics;  Laterality: Right;   TRANSFORAMINAL LUMBAR INTERBODY FUSION (TLIF) WITH PEDICLE SCREW FIXATION 2 LEVEL  Right 01/06/2022   Procedure: RIGHT-SIDED LUMBAR 4 - LUMBAR 5, LUMBAR 5- SACRUM 1 TRANSFORAMINAL LUMBAR INTERBODY FUSION AND DECOMPRESSION WITH INSTRUMENTATION AND ALLOGRAFT;  Surgeon: Steven Anes, MD;  Location: [facility];  Service: Orthopedics;  Laterality: Right;   Social History   Socioeconomic History   Marital status: Married    Spouse name: Steven Contreras   Number of children: 3   Years of education: Not on file   Highest education level: Not on file  Occupational History   Not on file  Tobacco Use   Smoking status: Former    Current packs/day: 0.00    Average packs/day: 0.5 packs/day for 15.0 years (7.5 ttl pk-yrs)    Types: Cigarettes    Start date: 05/04/1991    Quit date: 05/03/2006    Years since quitting: 17.7   Smokeless tobacco: Never  Vaping Use   Vaping status: Former  Substance and Sexual Activity   Alcohol  use: Yes    Comment: rarely   Drug use: Never   Sexual activity: Not Currently  Other Topics Concern   Not on file  Social History Narrative   Not on file   Social Drivers of Health   Financial Resource Strain: Not on file  Food Insecurity: Not on file  Transportation Needs: Not on file  Physical Activity: Not on file  Stress: Not on file  Social Connections: Unknown (09/15/2021)   [facility]   Social Network    Social Network: Not on file   Allergies  Allergen Reactions   Ace Inhibitors Cough   Ampicillin     NOT sure if its ampicillin or amoxicillin---stomach cramps   Effexor [Venlafaxine] Other (See Comments)    PT DOES NOT REMEMBER REACTION    Semaglutide Other (See Comments)   Wellbutrin [Bupropion] Other (See Comments)    PT DOES NOT REMEMBER REACTION    Family History  Problem Relation Age of Onset   Pancreatic cancer Mother    Prostate cancer Father    Allergic rhinitis Sister     Current Outpatient Medications (Endocrine & Metabolic):    insulin  glargine (LANTUS  SOLOSTAR) 100 UNIT/ML Solostar Pen, Inject 40 Units into the  skin daily.   metFORMIN  (GLUCOPHAGE ) 500 MG tablet, Take 1,000 mg by mouth 2 (two) times daily.   MOUNJARO 2.5 MG/0.5ML Pen, Inject into the skin.   predniSONE  (DELTASONE ) 20 MG tablet, Take 1 tablet (20 mg total) by mouth daily with breakfast.  Current Outpatient Medications (Cardiovascular):    amLODipine  (NORVASC ) 10 MG tablet, Take 10 mg by mouth daily.   furosemide  (LASIX ) 40 MG tablet, Take 40 mg by mouth daily.   nebivolol  (BYSTOLIC ) 5 MG tablet, Take 2 tablets (10 mg total) by mouth daily. (Patient taking differently: Take 5 mg by mouth daily.)   rosuvastatin  (CRESTOR ) 20 MG tablet, Take 1 tablet (20 mg total) by mouth daily. Please call office to schedule an appt for further refills. Thank you  Current Outpatient Medications (Respiratory):    loratadine  (CLARITIN ) 10 MG tablet, Take 10 mg by mouth daily as needed for allergies.  Current Outpatient Medications (Analgesics):    aspirin  EC 81 MG tablet, Take 81 mg by mouth daily. Swallow whole.   oxyCODONE -acetaminophen  (PERCOCET/ROXICET) 5-325 MG tablet, Take 1-2 tablets by mouth every 4 (four) hours as needed for severe pain.   Current Outpatient Medications (Other):    Cholecalciferol  (VITAMIN D ) 125 MCG (5000 UT) CAPS, Take 5,000 Units by mouth daily.   citalopram  (CELEXA ) 40 MG tablet, Take 40 mg by mouth daily.   Colloidal Oatmeal (GOLD BOND ECZEMA RELIEF) 2 % CREA, Apply 1 application topically daily as needed (eczema).   Magnesium Gluconate 250 MG TABS, 1 tablet Orally Once a day   methocarbamol  (ROBAXIN ) 500 MG tablet, Take 1 tablet (500 mg total) by mouth at bedtime.   polyvinyl alcohol  (LIQUIFILM TEARS) 1.4 % ophthalmic solution, Place 1 drop into both eyes 4 (four) times daily as needed for dry eyes.   potassium gluconate 595 (99 K) MG TABS tablet, Take 595 mg by mouth daily.   tiZANidine  (ZANAFLEX ) 2 MG tablet, Take 1 tablet (2 mg total) by mouth every 6 (six) hours as needed for muscle spasms.   zolpidem  (AMBIEN ) 10 MG  tablet, Take 10 mg by mouth at bedtime as needed for sleep.   Reviewed prior external information including notes and imaging from  primary care provider As well as notes that were available from care everywhere and other healthcare systems.  Past medical history, social, surgical and family history all reviewed in electronic medical record.  No pertanent information unless stated regarding to the chief complaint.   Review of Systems:  No headache, visual changes, nausea, vomiting, diarrhea, constipation, dizziness, abdominal pain, skin rash, fevers, chills, night sweats, weight loss, swollen lymph nodes, body  aches, joint swelling, chest pain, shortness of breath, mood changes. POSITIVE muscle aches  Objective  Blood pressure 122/86, pulse 81, height 5' 7 (1.702 m), weight 220 lb (99.8 kg), SpO2 98%.   General: No apparent distress alert and oriented x3 mood and affect normal, dressed appropriately.  HEENT: Pupils equal, extraocular movements intact  Respiratory: Patient's speak in full sentences and does not appear short of breath  Cardiovascular: No lower extremity edema, non tender, no erythema  Patient is ambulating with the aid of a cane at the moment.  Positive straight leg test noted on the left side.  This is at 20 degrees.  No significant weakness patient has difficulty even going from a seated to standing position without the cane.  Severely tender to palpation in the paraspinal musculature of the lumbar spine as well.    Impression and Recommendations:     The above documentation has been reviewed and is accurate and complete Donevin Sainsbury M Yaretzy Olazabal, DO

## 2024-02-13 NOTE — Assessment & Plan Note (Signed)
 Worsening lumbar radiculopathy, going down the left leg.  Seems to be a positive straight leg test with some weakness noted.  Very concerned about the longevity of this.  Patient has had a two-level fusion previously and x-rays do not show any specific loosening but does show some adjacent segment disease when comparing to previous x-rays.  Can be consistent with some of the radicular symptoms patient is having.  Toradol  and Depo-Medrol  injection given today, discussed icing regimen, discussed which activities to do and which ones to avoid.  Increase activity slowly.  Follow-up again when patient returns from [place].  Discussed the possibility of another Toradol  and Depo-Medrol  injection before he leaves as well as given some prednisone  20 mg to take daily for 5 days if needed on his trip.

## 2024-02-13 NOTE — Patient Instructions (Addendum)
 Full cocktail Friday Full cocktail given today. Prednisone  20mg  for 10 days-Start in [place] MRI lumbar

## 2024-02-16 ENCOUNTER — Encounter: Payer: Self-pay | Admitting: Family Medicine

## 2024-02-16 ENCOUNTER — Ambulatory Visit: Payer: Self-pay | Admitting: Family Medicine

## 2024-02-17 ENCOUNTER — Ambulatory Visit (INDEPENDENT_AMBULATORY_CARE_PROVIDER_SITE_OTHER)

## 2024-02-17 DIAGNOSIS — M5416 Radiculopathy, lumbar region: Secondary | ICD-10-CM | POA: Diagnosis not present

## 2024-02-17 MED ORDER — METHYLPREDNISOLONE ACETATE 80 MG/ML IJ SUSP
80.0000 mg | Freq: Once | INTRAMUSCULAR | Status: AC
  Administered 2024-02-17: 80 mg via INTRAMUSCULAR

## 2024-02-17 MED ORDER — KETOROLAC TROMETHAMINE 60 MG/2ML IM SOLN
60.0000 mg | Freq: Once | INTRAMUSCULAR | Status: AC
  Administered 2024-02-17: 60 mg via INTRAMUSCULAR

## 2024-02-17 NOTE — Progress Notes (Signed)
 Patient received Toradol  60 mg in left buttocks and Methylprednisolone  acetate in right buttocks. Patient tolerated well.

## 2024-02-28 ENCOUNTER — Ambulatory Visit
Admission: RE | Admit: 2024-02-28 | Discharge: 2024-02-28 | Disposition: A | Discharge status: Home / Self Care | Source: Ambulatory Visit | Attending: Family Medicine | Admitting: Family Medicine

## 2024-02-28 DIAGNOSIS — M545 Low back pain, unspecified: Secondary | ICD-10-CM

## 2024-03-01 ENCOUNTER — Encounter: Payer: Self-pay | Admitting: Family Medicine

## 2024-03-01 ENCOUNTER — Ambulatory Visit: Payer: Self-pay | Admitting: Family Medicine

## 2024-03-02 ENCOUNTER — Other Ambulatory Visit: Payer: Self-pay

## 2024-03-02 DIAGNOSIS — M5416 Radiculopathy, lumbar region: Secondary | ICD-10-CM

## 2024-03-06 NOTE — Discharge Instructions (Addendum)

## 2024-03-07 ENCOUNTER — Encounter: Payer: Self-pay | Admitting: Family Medicine

## 2024-03-07 ENCOUNTER — Ambulatory Visit
Admission: RE | Admit: 2024-03-07 | Discharge: 2024-03-07 | Disposition: A | Discharge status: Home / Self Care | Source: Ambulatory Visit | Attending: Family Medicine | Admitting: Family Medicine

## 2024-03-07 DIAGNOSIS — M5416 Radiculopathy, lumbar region: Secondary | ICD-10-CM

## 2024-03-07 MED ORDER — METHYLPREDNISOLONE ACETATE 40 MG/ML INJ SUSP (RADIOLOG
80.0000 mg | Freq: Once | INTRAMUSCULAR | Status: AC
  Administered 2024-03-07: 80 mg via EPIDURAL

## 2024-03-07 MED ORDER — IOPAMIDOL (ISOVUE-M 200) INJECTION 41%
1.0000 mL | Freq: Once | INTRAMUSCULAR | Status: AC
  Administered 2024-03-07: 1 mL via EPIDURAL

## 2024-04-03 ENCOUNTER — Ambulatory Visit: Admitting: Family Medicine

## 2024-04-03 VITALS — BP 138/92 | HR 88 | Ht 67.0 in | Wt 218.0 lb

## 2024-04-03 DIAGNOSIS — G5603 Carpal tunnel syndrome, bilateral upper limbs: Secondary | ICD-10-CM | POA: Diagnosis not present

## 2024-04-03 DIAGNOSIS — R200 Anesthesia of skin: Secondary | ICD-10-CM

## 2024-04-03 DIAGNOSIS — M5416 Radiculopathy, lumbar region: Secondary | ICD-10-CM

## 2024-04-03 DIAGNOSIS — R202 Paresthesia of skin: Secondary | ICD-10-CM

## 2024-04-03 MED ORDER — DULOXETINE HCL 20 MG PO CPEP: 20.0000 mg | ORAL_CAPSULE | Freq: Every day | ORAL | 0 refills | Status: DC

## 2024-04-03 NOTE — Progress Notes (Signed)
 Steven Contreras [address] [place] [address] Phone: [phone] Subjective:    I'm seeing this patient by the request  of:  Verena Mems, MD  CC: Low back pain follow-up  YEP:Dlagzrupcz  [name] is a 68 y.o. male coming in with complaint of low back pain.  Patient did have an MRI showing that there was discectomy but adjacent segment disease with severe spinal stenosis at the L3-L4 area.  Undergone an epidural at L3-L4 on November 5.  Patient states that the epidural was painful but helpful.   Pain in coming back in lumbar spine and L leg. Pain and burning in R shoulder now as well.   Pain and numbness in both hands.       Past Medical History:  Diagnosis Date   ADD (attention deficit disorder)    Anxiety    hx of   Arthritis    Diabetes (HCC)    Eczema    Family history of adverse reaction to anesthesia    mother slow to awaken   Headache    High cholesterol    History of kidney stones    Hypertension    Hyperthyroidism    Right rotator cuff tear    Sleep apnea    Past Surgical History:  Procedure Laterality Date   APPENDECTOMY     when he was [age]   colonscopy  2014   IR URETERAL STENT LEFT NEW ACCESS W/O SEP NEPHROSTOMY CATH  12/29/2020   NEPHROLITHOTOMY Left 12/29/2020   Procedure: LEFT NEPHROLITHOTOMY PERCUTANEOUS;  Surgeon: Carolee Sherwood JONETTA DOUGLAS, MD;  Location: [facility];  Service: Urology;  Laterality: Left;   SHOULDER SURGERY Left years ago   rotator, bicep and clavicle repair.   SHOULDER SURGERY Right 2016   TOTAL HIP ARTHROPLASTY Right 10/08/2014   Procedure: RIGHT TOTAL HIP ARTHROPLASTY ANTERIOR APPROACH;  Surgeon: Donnice Car, MD;  Location: [facility];  Service: Orthopedics;  Laterality: Right;   TRANSFORAMINAL LUMBAR INTERBODY FUSION (TLIF) WITH PEDICLE SCREW FIXATION 2 LEVEL Right 01/06/2022   Procedure: RIGHT-SIDED LUMBAR 4 - LUMBAR 5, LUMBAR 5- SACRUM 1 TRANSFORAMINAL LUMBAR INTERBODY FUSION AND DECOMPRESSION  WITH INSTRUMENTATION AND ALLOGRAFT;  Surgeon: Beuford Anes, MD;  Location: [facility];  Service: Orthopedics;  Laterality: Right;   Social History   Socioeconomic History   Marital status: Married    Spouse name: Mary   Number of children: 3   Years of education: Not on file   Highest education level: Not on file  Occupational History   Not on file  Tobacco Use   Smoking status: Former    Current packs/day: 0.00    Average packs/day: 0.5 packs/day for 15.0 years (7.5 ttl pk-yrs)    Types: Cigarettes    Start date: 05/04/1991    Quit date: 05/03/2006    Years since quitting: 17.9   Smokeless tobacco: Never  Vaping Use   Vaping status: Former  Substance and Sexual Activity   Alcohol  use: Yes    Comment: rarely   Drug use: Never   Sexual activity: Not Currently  Other Topics Concern   Not on file  Social History Narrative   Not on file   Social Drivers of Health   Financial Resource Strain: Not on file  Food Insecurity: Not on file  Transportation Needs: Not on file  Physical Activity: Not on file  Stress: Not on file  Social Connections: Unknown (09/15/2021)   [facility]   Social  Network    Social Network: Not on file   Allergies  Allergen Reactions   Ace Inhibitors Cough   Ampicillin     NOT sure if its ampicillin or amoxicillin---stomach cramps   Effexor [Venlafaxine] Other (See Comments)    PT DOES NOT REMEMBER REACTION    Semaglutide Other (See Comments)   Wellbutrin [Bupropion] Other (See Comments)    PT DOES NOT REMEMBER REACTION    Family History  Problem Relation Age of Onset   Pancreatic cancer Mother    Prostate cancer Father    Allergic rhinitis Sister     Current Outpatient Medications (Endocrine & Metabolic):    insulin  glargine (LANTUS  SOLOSTAR) 100 UNIT/ML Solostar Pen, Inject 40 Units into the skin daily.   metFORMIN  (GLUCOPHAGE ) 500 MG tablet, Take 1,000 mg by mouth 2 (two) times daily.   MOUNJARO 2.5 MG/0.5ML Pen, Inject into  the skin.   predniSONE  (DELTASONE ) 20 MG tablet, Take 1 tablet (20 mg total) by mouth daily with breakfast.  Current Outpatient Medications (Cardiovascular):    amLODipine  (NORVASC ) 10 MG tablet, Take 10 mg by mouth daily.   furosemide  (LASIX ) 40 MG tablet, Take 40 mg by mouth daily.   nebivolol  (BYSTOLIC ) 5 MG tablet, Take 2 tablets (10 mg total) by mouth daily. (Patient taking differently: Take 5 mg by mouth daily.)   rosuvastatin  (CRESTOR ) 20 MG tablet, Take 1 tablet (20 mg total) by mouth daily. Please call office to schedule an appt for further refills. Thank you  Current Outpatient Medications (Respiratory):    loratadine  (CLARITIN ) 10 MG tablet, Take 10 mg by mouth daily as needed for allergies.  Current Outpatient Medications (Analgesics):    aspirin  EC 81 MG tablet, Take 81 mg by mouth daily. Swallow whole.   oxyCODONE -acetaminophen  (PERCOCET/ROXICET) 5-325 MG tablet, Take 1-2 tablets by mouth every 4 (four) hours as needed for severe pain.   Current Outpatient Medications (Other):    Cholecalciferol  (VITAMIN D ) 125 MCG (5000 UT) CAPS, Take 5,000 Units by mouth daily.   citalopram  (CELEXA ) 40 MG tablet, Take 40 mg by mouth daily.   Colloidal Oatmeal (GOLD BOND ECZEMA RELIEF) 2 % CREA, Apply 1 application topically daily as needed (eczema).   DULoxetine (CYMBALTA) 20 MG capsule, Take 1 capsule (20 mg total) by mouth daily.   Magnesium Gluconate 250 MG TABS, 1 tablet Orally Once a day   methocarbamol  (ROBAXIN ) 500 MG tablet, Take 1 tablet (500 mg total) by mouth at bedtime.   polyvinyl alcohol  (LIQUIFILM TEARS) 1.4 % ophthalmic solution, Place 1 drop into both eyes 4 (four) times daily as needed for dry eyes.   potassium gluconate 595 (99 K) MG TABS tablet, Take 595 mg by mouth daily.   tiZANidine  (ZANAFLEX ) 2 MG tablet, Take 1 tablet (2 mg total) by mouth every 6 (six) hours as needed for muscle spasms.   zolpidem  (AMBIEN ) 10 MG tablet, Take 10 mg by mouth at bedtime as needed for  sleep.   Reviewed prior external information including notes and imaging from  primary care provider As well as notes that were available from care everywhere and other healthcare systems.  Past medical history, social, surgical and family history all reviewed in electronic medical record.  No pertanent information unless stated regarding to the chief complaint.   Review of Systems:  No headache, visual changes, nausea, vomiting, diarrhea, constipation, dizziness, abdominal pain, skin rash, fevers, chills, night sweats, weight loss, swollen lymph nodes, body aches, joint swelling, chest pain, shortness of breath,  mood changes. POSITIVE muscle aches  Objective  Blood pressure (!) 138/92, pulse 88, height 5' 7 (1.702 m), weight 218 lb (98.9 kg), SpO2 95%.   General: No apparent distress alert and oriented x3 mood and affect normal, dressed appropriately.  HEENT: Pupils equal, extraocular movements intact  Respiratory: Patient's speak in full sentences and does not appear short of breath  Cardiovascular: No lower extremity edema, non tender, no erythema  Low back exam shows significant loss of lordosis.  Less than 5 degrees of extension of the back noted.  Neurovasc intact distally. Looking at patient's hands do have thenar eminence wasting noted on the right side.  Positive Tinel's bilaterally.  Relatively good grip strength though noted.  Neck exam does have some mild limited range of motion but negative Spurling's noted today.    Impression and Recommendations:     The above documentation has been reviewed and is accurate and complete Jermichael Belmares M Camay Pedigo, DO

## 2024-04-03 NOTE — Assessment & Plan Note (Addendum)
 Attempted injection previously.  Although the right side is having some thenar eminence wasting.  Do think at this point we should consider nerve conduction test to further evaluate.  Depending on severity would need to talk surgical intervention.  If mild to moderate we will consider injections.  Follow-up again in 6 to 12 weeks otherwise for both the back and the hands started Cymbalta 20 mg and will discontinue citalopram 

## 2024-04-03 NOTE — Patient Instructions (Addendum)
 Cymbalta 20mg  prescribed, stop citalopram  Referral to Neurology

## 2024-04-03 NOTE — Assessment & Plan Note (Signed)
 Discussed with patient icing regimen and home exercises.  Does have adjacent segment disease that I think is contributing.  Increase activity.  Discussed with patient that I do feel an epidural is beneficial.  Discussed icing regimen.  Follow-up with me again in 6 to 12 weeks

## 2024-04-13 ENCOUNTER — Encounter: Payer: Self-pay | Admitting: Family Medicine

## 2024-04-13 ENCOUNTER — Telehealth: Payer: Self-pay | Admitting: Family Medicine

## 2024-04-13 ENCOUNTER — Other Ambulatory Visit: Payer: Self-pay

## 2024-04-13 DIAGNOSIS — M5416 Radiculopathy, lumbar region: Secondary | ICD-10-CM

## 2024-04-13 NOTE — Telephone Encounter (Signed)
 Pt wife left a message in our afterhours VM.  Would like Dr. Claudene to call her or write her in her MyChart. Pt is not doing well and she is looking for some context.  Looks like pt sent a [employer] as well.

## 2024-04-17 ENCOUNTER — Ambulatory Visit

## 2024-04-17 DIAGNOSIS — M545 Low back pain, unspecified: Secondary | ICD-10-CM | POA: Diagnosis not present

## 2024-04-17 DIAGNOSIS — M5416 Radiculopathy, lumbar region: Secondary | ICD-10-CM

## 2024-04-17 MED ORDER — METHYLPREDNISOLONE ACETATE 80 MG/ML IJ SUSP
80.0000 mg | Freq: Once | INTRAMUSCULAR | Status: AC
  Administered 2024-04-17: 15:00:00 80 mg via INTRAMUSCULAR

## 2024-04-17 MED ORDER — KETOROLAC TROMETHAMINE 60 MG/2ML IM SOLN
60.0000 mg | Freq: Once | INTRAMUSCULAR | Status: AC
  Administered 2024-04-17: 15:00:00 60 mg via INTRAMUSCULAR

## 2024-04-17 NOTE — Progress Notes (Signed)
 Patient received Toradol  60 mg in left buttocks and Methylprednisolone  80 mg in right buttocks. Patient tolerated well.

## 2024-04-17 NOTE — Patient Instructions (Signed)
 No NSAIDs for 24 hrs.

## 2024-04-19 ENCOUNTER — Other Ambulatory Visit (HOSPITAL_BASED_OUTPATIENT_CLINIC_OR_DEPARTMENT_OTHER): Payer: Self-pay

## 2024-04-19 MED ORDER — LISDEXAMFETAMINE DIMESYLATE 10 MG PO CHEW
10.0000 mg | CHEWABLE_TABLET | Freq: Every day | ORAL | 0 refills | Status: AC
  Filled 2024-04-19: qty 90, 90d supply, fill #0

## 2024-04-19 MED ORDER — LISDEXAMFETAMINE DIMESYLATE 10 MG PO CHEW
10.0000 mg | CHEWABLE_TABLET | ORAL | 0 refills | Status: AC
  Filled 2024-04-19: qty 30, 30d supply, fill #0

## 2024-04-24 ENCOUNTER — Other Ambulatory Visit: Payer: Self-pay

## 2024-04-24 DIAGNOSIS — R202 Paresthesia of skin: Secondary | ICD-10-CM

## 2024-05-12 ENCOUNTER — Other Ambulatory Visit: Payer: Self-pay | Admitting: Family Medicine

## 2024-05-30 NOTE — Progress Notes (Addendum)
 " Cardiology Office Note   Date: 05/31/2024  ID:  Steven Contreras [id number] [id number] PCP: Verena Mems, MD  [facility] Providers Cardiologist: Newman JINNY Lawrence, MD     Chief Complaint: Steven Contreras is a 69 y.o.male with PMH of hypertension, hyperlipidemia, coronary calcium  score 287 02/2020, T2DM who presents to the clinic for overdue annual follow-up and preoperative cardiovascular risk assessment.    [name] was referred to Dr. Lawrence in 2021 for evaluation of dyspnea on exertion.  He had a previous coronary calcium  score of 287 with no stenosis in the LM but did show disease in the LAD, RCA, and LCx. He was referred for echo, this showed LVEF 65% without RWMAs.  Stress test 03/2020 was low risk.  Last visit 06/11/2022, he had recently undergone spinal surgery and had remarkable improvement in back pain and functional ability.  He was noted to have elevated triglycerides which was felt likely due to uncontrolled diabetes.  Was also hypertensive but this was inconsistent with his home readings, medication regimen remain the same.  Records from his PCP are difficult to view, but it appears that he had work-up for secondary hypertension with renal artery duplex 10/05/2023 that was negative for stenosis.  He is awaiting back surgery.    History of Present Illness: Today he comes in for evaluation prior to his L3-L4 fusion and disc replacement with Dr. Beuford, though we have not received official request for this. His activity has been limited by his orthopedic symptoms and he now utilizes a cane to help with balance. However, he did travel to [place] to visit his daughter in the fall and was able to walk around 20,000 steps without chest pain or dyspnea. He has continued to work around his house and has carried firewood recently without symptoms. He participated in the Lose It program and was able to lose 55 pounds. He did not take his medications this morning. He is no longer  taking rosuvastatin  due to muscle aches. His PCP prescribed an injectable medication for cholesterol but he has not started this for fear of further orthopedic side effects. He no longer monitors his BP at home regularly.  ROS: Denies chest pain, shortness of breath, lower extremity edema, palpitations, syncope.   Studies Reviewed: The following studies were reviewed today: Cardiac Studies & Procedures   ______________________________________________________________________________________________   STRESS TESTS  PCV MYOCARDIAL PERFUSION WITH LEXISCAN  03/05/2020  Narrative Lexiscan  (Walking with mod Bruce)Tetrofosmin Stress Test  03/05/2020: Nondiagnostic ECG stress, pharmacologic stresss. There is a  moderate defect in the lateral, inferior and apical regions, due to prominent gut uptake artifact possibly large hiatal hernia noted in the inferolateral wall. In the absence of wall motion abnormality and normal LVEF, ischemia is less likely. Overall LV systolic function is normal without regional wall motion abnormalities. Stress LV EF: 62%. No previous exam available for comparison. Low risk. Clinical correlation recommended.   ECHOCARDIOGRAM  PCV ECHOCARDIOGRAM COMPLETE 02/26/2020  Narrative Echocardiogram 02/26/2020: Left ventricle cavity is normal in size and wall thickness. Normal global wall motion. Normal LV systolic function with EF 65%. Normal diastolic filling pattern. No significant valvular abnormality. normal right atrial pressure.          ______________________________________________________________________________________________      EKG Interpretation Date/Time:  Thursday May 31 2024 11:33:47 EST Ventricular Rate:  76 PR Interval:  168 QRS Duration:  92 QT Interval:  396 QTC Calculation: 445 R Axis:   35  Text Interpretation: Normal sinus rhythm poor  R wave progression in the anterior leads no change when compared to previous EKG Confirmed by  Nola Numbers [phone]) on 05/31/2024 11:42:46 AM    Risk Assessment/Calculations:    HYPERTENSION CONTROL Vitals:   05/31/24 1140 05/31/24 1150  BP: (!) 166/92 (!) 148/90    The patient's blood pressure is elevated above target today.  In order to address the patient's elevated BP: Blood pressure will be monitored at home to determine if medication changes need to be made.               Physical Exam: VS: BP (!) 148/90 (BP Location: Left Arm, Patient Position: Sitting)   Pulse 76   Ht 5' 7 (1.702 m)   Wt 213 lb 3.2 oz (96.7 kg)   SpO2 98%   BMI 33.39 kg/m  Wt Readings from Last 3 Encounters:  05/31/24 213 lb 3.2 oz (96.7 kg)  04/03/24 218 lb (98.9 kg)  02/13/24 220 lb (99.8 kg)    GEN: Well nourished, in NAD HEENT: Normal NECK: No JVD CARDIAC: RRR, no murmurs, rubs, gallops RESPIRATORY: Clear to auscultation without rales, wheezing or rhonchi  ABDOMEN: Soft, non-tender, non-distended MUSCULOSKELETAL: No edema, no deformity  SKIN: Warm and dry NEUROLOGIC:  Alert and oriented x 3 PSYCHIATRIC:  Normal affect   Assessment & Plan: 1. Hypertension: BP today 166/92 initially, 148/98 on my recheck.  He has not had his medications yet this morning and tells me that he is in significant pain from his orthopedic issues.  He is no longer monitoring his BP at home. - He will monitor his BP 2 hours after his morning medications and keep a log for 2 weeks - advised to notify the clinic of his results - Continue nebivolol  5 mg daily - he was previously on 10 mg, plan to increase this again if BP persistently high - Continue amlodipine  10 mg daily   2. CAD: Coronary calcium  score 02/2020 was 287. Echo 02/2020 showed LVEF 65% with no RWMAs, stress test 03/2020 low risk. EKG today unremarkable.  He denies anginal symptoms.  - Continue aspirin  81 mg daily - Continue nebivolol  5 mg daily  3. Hyperlipidemia, goal LDL < 70: 07/22/2023 LDL 80, HDL 68, TGs 211. Management per PCP, has been  intolerant to statins in the past. Has apparently been prescribed an injectable, though he has not started this for concerns of further muscle aches.  I agree that this is a great option for him.  4. Preoperative cardiovascular risk assessment: We have not received official request from Dr. Andrey office, but the patient tells me that he is going to be undergoing L3-L4 fusion and disc replacement. According to the Revised Cardiac Risk Index (RCRI), his Perioperative Risk of Major Cardiac Event is (%): 0.4. His Functional Capacity in METs is: 7.99 according to the Duke Activity Status Index (DASI).  - Per AHA/ACC guidelines, he is deemed acceptable risk for the planned procedure without additional cardiovascular testing. The patient was advised that if he develops new symptoms prior to surgery to contact our office to arrange for a follow-up visit, and he verbalized understanding. Will route to surgical team so they are aware.  - Regarding ASA therapy, we recommend continuation of ASA throughout the perioperative period.  However, if the surgeon feels that cessation of ASA is required in the perioperative period, it may be stopped 5-7 days prior to surgery with a plan to resume it as soon as felt to be feasible from a surgical standpoint  in the post-operative period.   Dispo: Follow-up with Dr. Elmira in 6 months.  Signed, Saddie GORMAN Cleaves, NP 06/01/2024 1:06 PM [facility] "

## 2024-05-31 ENCOUNTER — Ambulatory Visit: Attending: Physician Assistant

## 2024-05-31 ENCOUNTER — Encounter: Payer: Self-pay | Admitting: Physician Assistant

## 2024-05-31 VITALS — BP 148/90 | HR 76 | Ht 67.0 in | Wt 213.2 lb

## 2024-05-31 DIAGNOSIS — Z01810 Encounter for preprocedural cardiovascular examination: Secondary | ICD-10-CM | POA: Diagnosis present

## 2024-05-31 DIAGNOSIS — I2510 Atherosclerotic heart disease of native coronary artery without angina pectoris: Secondary | ICD-10-CM | POA: Diagnosis present

## 2024-05-31 DIAGNOSIS — I10 Essential (primary) hypertension: Secondary | ICD-10-CM | POA: Diagnosis present

## 2024-05-31 DIAGNOSIS — E782 Mixed hyperlipidemia: Secondary | ICD-10-CM | POA: Diagnosis present

## 2024-05-31 NOTE — Patient Instructions (Addendum)
 Medication Instructions:  NO CHANGES *If you need a refill on your cardiac medications before your next appointment, please call your pharmacy*  Lab Work: NO LABS If you have labs (blood work) drawn today and your tests are completely normal, you will receive your results only by: MyChart Message (if you have MyChart) OR A paper copy in the mail If you have any lab test that is abnormal or we need to change your treatment, we will call you to review the results.  Testing/Procedures: NO TESTING  Follow-Up: At [facility], you and your health needs are our priority.  As part of our continuing mission to provide you with exceptional heart care, our providers are all part of one team.  This team includes your primary Cardiologist (physician) and Advanced Practice Providers or APPs (Physician Assistants and Nurse Practitioners) who all work together to provide you with the care you need, when you need it.  Your next appointment:   6 month(s)  Provider:   Newman JINNY Lawrence, MD    Other Instructions Please monitor your blood pressure 1-2 hours after you take nebivolol  (Bystolic ) and amlodipine  (Norvasc ) and keep a log for 1-2 weeks.

## 2024-06-01 ENCOUNTER — Other Ambulatory Visit: Payer: Self-pay | Admitting: Orthopedic Surgery

## 2024-06-01 DIAGNOSIS — M5416 Radiculopathy, lumbar region: Secondary | ICD-10-CM

## 2024-06-05 ENCOUNTER — Other Ambulatory Visit: Payer: Self-pay | Admitting: Orthopedic Surgery

## 2024-06-08 ENCOUNTER — Inpatient Hospital Stay: Admission: RE | Admit: 2024-06-08

## 2024-06-08 DIAGNOSIS — M5416 Radiculopathy, lumbar region: Secondary | ICD-10-CM

## 2024-06-20 ENCOUNTER — Inpatient Hospital Stay: Admit: 2024-06-20 | Admitting: Orthopedic Surgery

## 2024-06-21 ENCOUNTER — Inpatient Hospital Stay: Admit: 2024-06-21 | Admitting: Orthopedic Surgery

## 2024-07-16 ENCOUNTER — Encounter: Payer: Self-pay | Admitting: Neurology
